# Patient Record
Sex: Female | Born: 1945 | Race: White | Hispanic: No | Marital: Married | Smoking: Former smoker
Health system: Southern US, Community
[De-identification: ages and names within clinical notes are randomized; demographics above are authoritative.]

## PROBLEM LIST (undated history)

## (undated) DIAGNOSIS — C44722 Squamous cell carcinoma of skin of right lower limb, including hip: Secondary | ICD-10-CM

## (undated) DIAGNOSIS — G4733 Obstructive sleep apnea (adult) (pediatric): Secondary | ICD-10-CM

## (undated) DIAGNOSIS — Z9989 Dependence on other enabling machines and devices: Secondary | ICD-10-CM

## (undated) DIAGNOSIS — C20 Malignant neoplasm of rectum: Secondary | ICD-10-CM

## (undated) DIAGNOSIS — I10 Essential (primary) hypertension: Secondary | ICD-10-CM

## (undated) DIAGNOSIS — I2510 Atherosclerotic heart disease of native coronary artery without angina pectoris: Secondary | ICD-10-CM

## (undated) DIAGNOSIS — Z933 Colostomy status: Secondary | ICD-10-CM

## (undated) DIAGNOSIS — E782 Mixed hyperlipidemia: Secondary | ICD-10-CM

## (undated) DIAGNOSIS — K219 Gastro-esophageal reflux disease without esophagitis: Secondary | ICD-10-CM

## (undated) DIAGNOSIS — C439 Malignant melanoma of skin, unspecified: Secondary | ICD-10-CM

## (undated) DIAGNOSIS — I209 Angina pectoris, unspecified: Secondary | ICD-10-CM

## (undated) DIAGNOSIS — E039 Hypothyroidism, unspecified: Secondary | ICD-10-CM

## (undated) DIAGNOSIS — M1990 Unspecified osteoarthritis, unspecified site: Secondary | ICD-10-CM

## (undated) DIAGNOSIS — E119 Type 2 diabetes mellitus without complications: Secondary | ICD-10-CM

## (undated) HISTORY — PX: BREAST EXCISIONAL BIOPSY: SUR124

## (undated) HISTORY — PX: MELANOMA EXCISION: SHX5266

## (undated) HISTORY — PX: CORONARY ANGIOPLASTY WITH STENT PLACEMENT: SHX49

## (undated) HISTORY — PX: BREAST CYST ASPIRATION: SHX578

## (undated) HISTORY — PX: TONSILLECTOMY: SUR1361

## (undated) HISTORY — PX: SQUAMOUS CELL CARCINOMA EXCISION: SHX2433

## (undated) HISTORY — DX: Atherosclerotic heart disease of native coronary artery without angina pectoris: I25.10

## (undated) HISTORY — PX: APPENDECTOMY: SHX54

## (undated) HISTORY — DX: Gastro-esophageal reflux disease without esophagitis: K21.9

## (undated) HISTORY — DX: Mixed hyperlipidemia: E78.2

## (undated) HISTORY — PX: LAPAROSCOPIC CHOLECYSTECTOMY: SUR755

## (undated) HISTORY — PX: RIGHT OOPHORECTOMY: SHX2359

---

## 1998-08-06 ENCOUNTER — Other Ambulatory Visit: Admission: RE | Admit: 1998-08-06 | Discharge: 1998-08-06 | Payer: Self-pay | Admitting: *Deleted

## 2000-07-05 ENCOUNTER — Ambulatory Visit (HOSPITAL_COMMUNITY): Admission: RE | Admit: 2000-07-05 | Discharge: 2000-07-05 | Payer: Self-pay | Admitting: Family Medicine

## 2000-07-05 ENCOUNTER — Encounter: Payer: Self-pay | Admitting: Family Medicine

## 2000-10-30 ENCOUNTER — Other Ambulatory Visit: Admission: RE | Admit: 2000-10-30 | Discharge: 2000-10-30 | Payer: Self-pay | Admitting: *Deleted

## 2000-10-31 ENCOUNTER — Encounter: Admission: RE | Admit: 2000-10-31 | Discharge: 2000-10-31 | Payer: Self-pay | Admitting: *Deleted

## 2000-10-31 ENCOUNTER — Encounter: Payer: Self-pay | Admitting: *Deleted

## 2001-12-24 ENCOUNTER — Ambulatory Visit (HOSPITAL_COMMUNITY): Admission: RE | Admit: 2001-12-24 | Discharge: 2001-12-24 | Payer: Self-pay | Admitting: Gastroenterology

## 2002-01-07 ENCOUNTER — Encounter: Payer: Self-pay | Admitting: *Deleted

## 2002-01-07 ENCOUNTER — Ambulatory Visit (HOSPITAL_COMMUNITY): Admission: RE | Admit: 2002-01-07 | Discharge: 2002-01-07 | Payer: Self-pay | Admitting: *Deleted

## 2002-01-29 ENCOUNTER — Encounter: Payer: Self-pay | Admitting: Cardiology

## 2002-01-29 ENCOUNTER — Ambulatory Visit: Admission: RE | Admit: 2002-01-29 | Discharge: 2002-01-29 | Payer: Self-pay | Admitting: *Deleted

## 2002-02-07 ENCOUNTER — Ambulatory Visit: Admission: RE | Admit: 2002-02-07 | Discharge: 2002-02-07 | Payer: Self-pay | Admitting: *Deleted

## 2002-03-10 ENCOUNTER — Ambulatory Visit (HOSPITAL_COMMUNITY): Admission: RE | Admit: 2002-03-10 | Discharge: 2002-03-11 | Payer: Self-pay | Admitting: Cardiology

## 2002-09-22 ENCOUNTER — Ambulatory Visit (HOSPITAL_COMMUNITY): Admission: RE | Admit: 2002-09-22 | Discharge: 2002-09-22 | Payer: Self-pay | Admitting: Gastroenterology

## 2002-09-22 ENCOUNTER — Encounter (INDEPENDENT_AMBULATORY_CARE_PROVIDER_SITE_OTHER): Payer: Self-pay

## 2002-09-30 ENCOUNTER — Encounter: Payer: Self-pay | Admitting: Surgery

## 2002-09-30 ENCOUNTER — Encounter: Admission: RE | Admit: 2002-09-30 | Discharge: 2002-09-30 | Payer: Self-pay | Admitting: Surgery

## 2002-10-10 ENCOUNTER — Observation Stay (HOSPITAL_COMMUNITY): Admission: RE | Admit: 2002-10-10 | Discharge: 2002-10-11 | Payer: Self-pay | Admitting: Surgery

## 2002-10-10 ENCOUNTER — Encounter (INDEPENDENT_AMBULATORY_CARE_PROVIDER_SITE_OTHER): Payer: Self-pay | Admitting: Specialist

## 2002-10-17 ENCOUNTER — Ambulatory Visit: Admission: RE | Admit: 2002-10-17 | Discharge: 2002-10-29 | Payer: Self-pay

## 2002-12-02 ENCOUNTER — Ambulatory Visit: Admission: RE | Admit: 2002-12-02 | Discharge: 2003-02-01 | Payer: Self-pay | Admitting: Radiation Oncology

## 2002-12-16 ENCOUNTER — Encounter: Payer: Self-pay | Admitting: Surgery

## 2002-12-16 ENCOUNTER — Ambulatory Visit (HOSPITAL_COMMUNITY): Admission: RE | Admit: 2002-12-16 | Discharge: 2002-12-16 | Payer: Self-pay | Admitting: Oncology

## 2002-12-16 ENCOUNTER — Ambulatory Visit (HOSPITAL_BASED_OUTPATIENT_CLINIC_OR_DEPARTMENT_OTHER): Admission: RE | Admit: 2002-12-16 | Discharge: 2002-12-16 | Payer: Self-pay | Admitting: Surgery

## 2002-12-16 ENCOUNTER — Encounter: Payer: Self-pay | Admitting: Oncology

## 2002-12-17 ENCOUNTER — Encounter: Payer: Self-pay | Admitting: Surgery

## 2002-12-17 ENCOUNTER — Ambulatory Visit (HOSPITAL_COMMUNITY): Admission: RE | Admit: 2002-12-17 | Discharge: 2002-12-17 | Payer: Self-pay | Admitting: Surgery

## 2002-12-18 ENCOUNTER — Encounter: Payer: Self-pay | Admitting: Oncology

## 2002-12-18 ENCOUNTER — Ambulatory Visit (HOSPITAL_COMMUNITY): Admission: RE | Admit: 2002-12-18 | Discharge: 2002-12-18 | Payer: Self-pay | Admitting: Oncology

## 2003-03-03 ENCOUNTER — Ambulatory Visit: Admission: RE | Admit: 2003-03-03 | Discharge: 2003-03-03 | Payer: Self-pay | Admitting: Radiation Oncology

## 2003-03-18 ENCOUNTER — Ambulatory Visit (HOSPITAL_COMMUNITY): Admission: RE | Admit: 2003-03-18 | Discharge: 2003-03-18 | Payer: Self-pay | Admitting: Hematology & Oncology

## 2003-03-18 IMAGING — CT CT PELVIS W/ CM
1 of 6 series · 12 of 32 positions shown, 18 images · non-contrast
Comparison: none

CLINICAL DATA: Rectal carcinoma.

[Series 5: — · axial · 0.74mm/px · z∈[+814,+1254]mm · 12 of 104 slices shown, 18 images]
[im 8/104  soft-tissue]
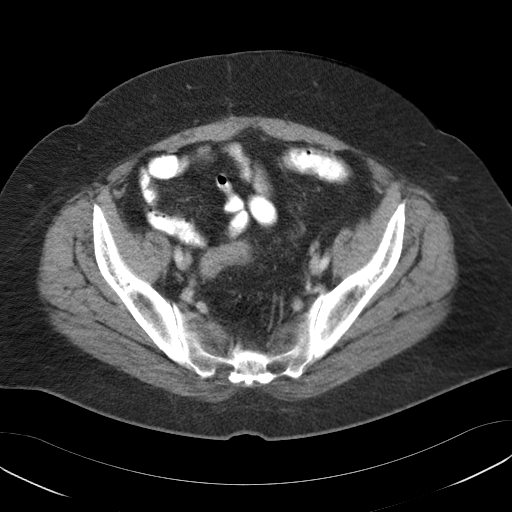
[im 8/104  bone]
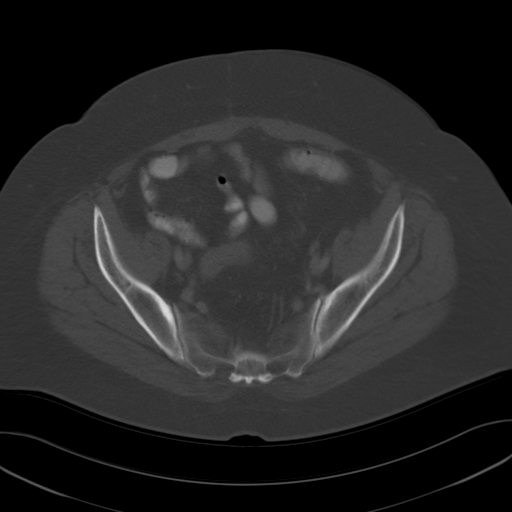
[im 16/104  soft-tissue]
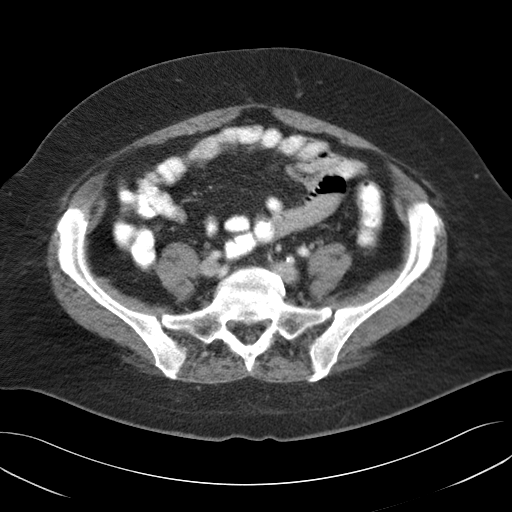
[im 24/104  soft-tissue]
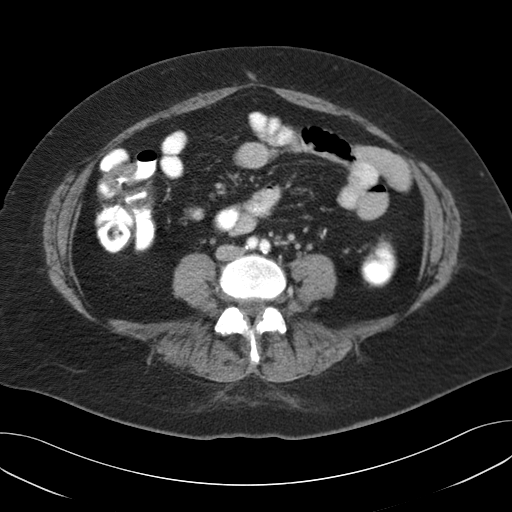
[im 32/104  soft-tissue]
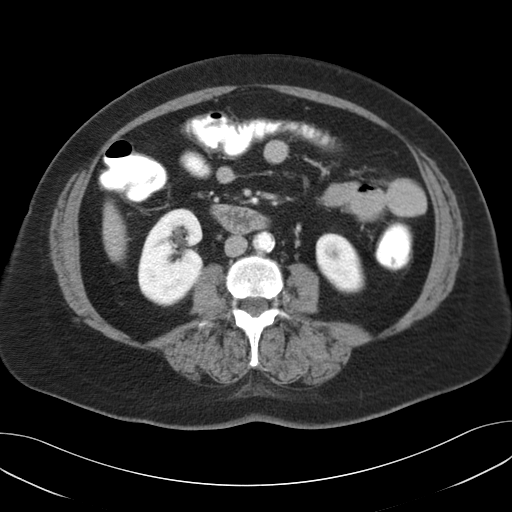
[im 40/104  soft-tissue]
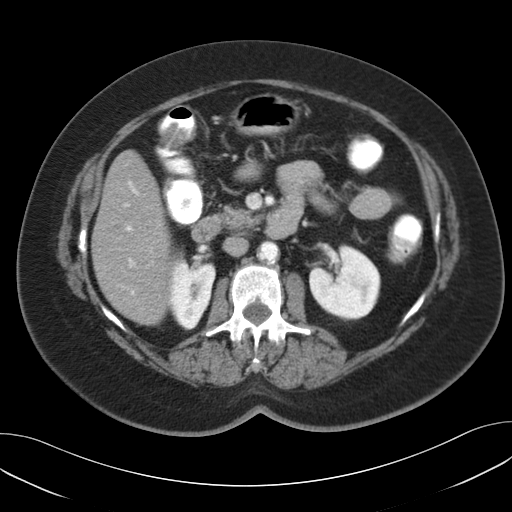
[im 48/104  soft-tissue]
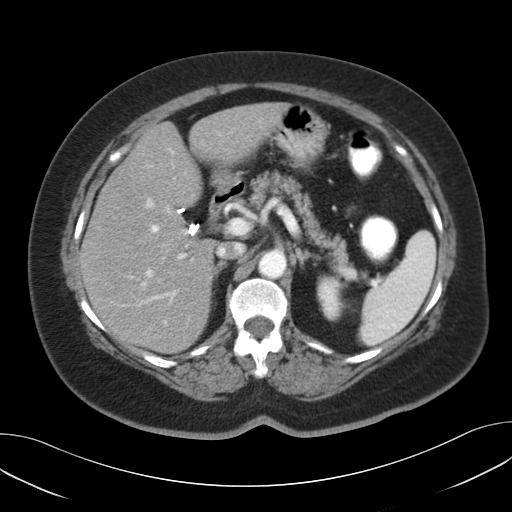
[im 56/104  soft-tissue]
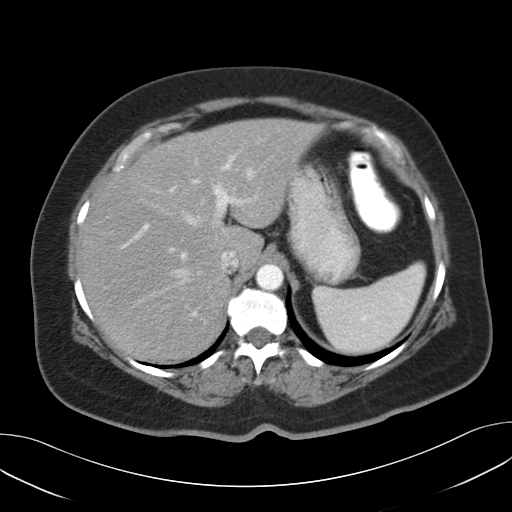
[im 64/104  soft-tissue]
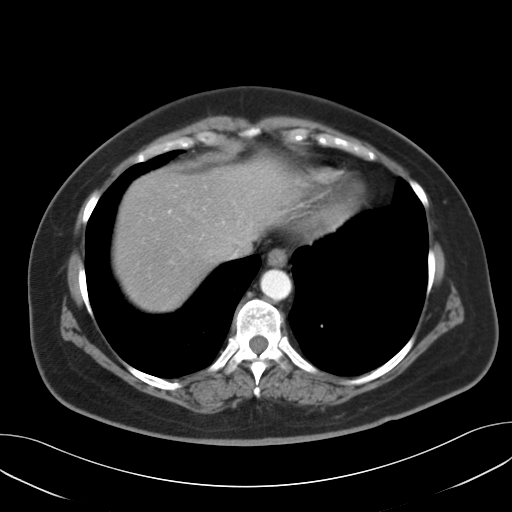
[im 72/104  soft-tissue]
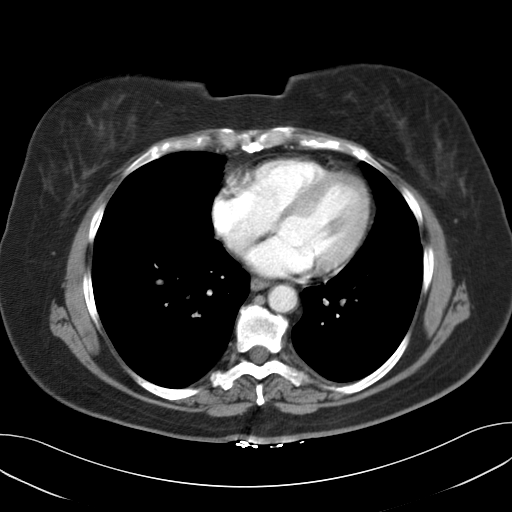
[im 72/104  lung]
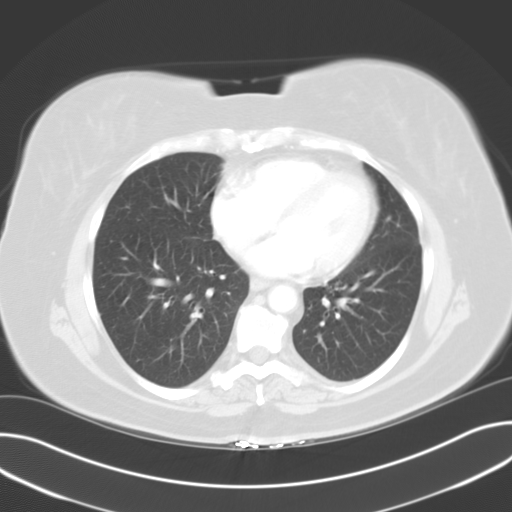
[im 72/104  bone]
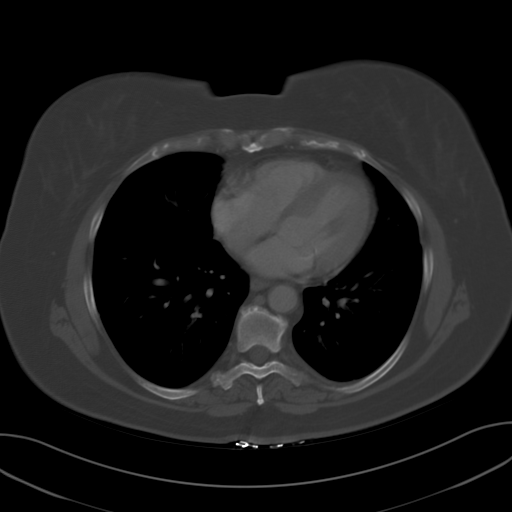
[im 80/104  soft-tissue]
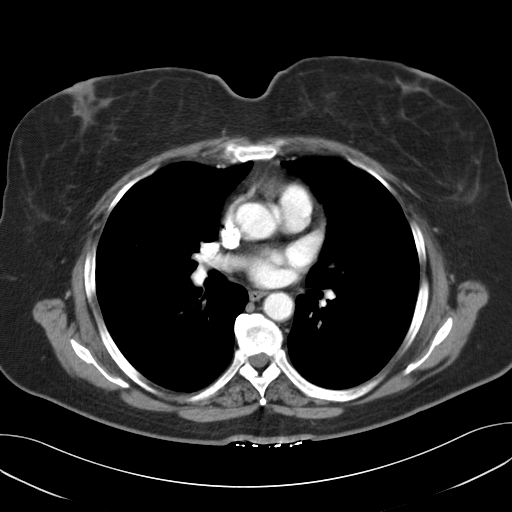
[im 80/104  lung]
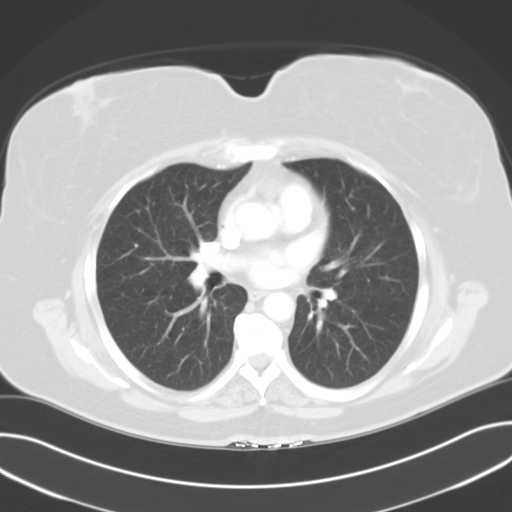
[im 88/104  soft-tissue]
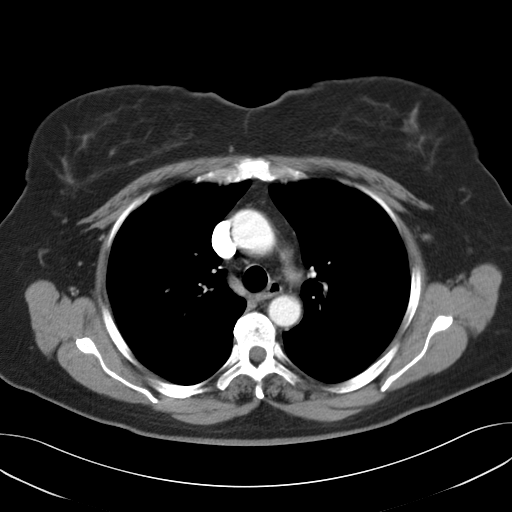
[im 88/104  lung]
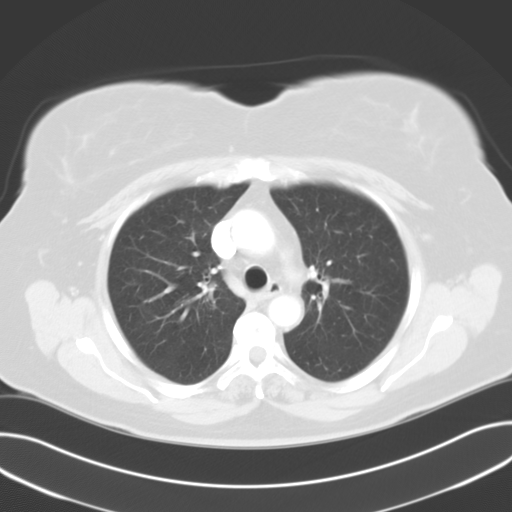
[im 96/104  soft-tissue]
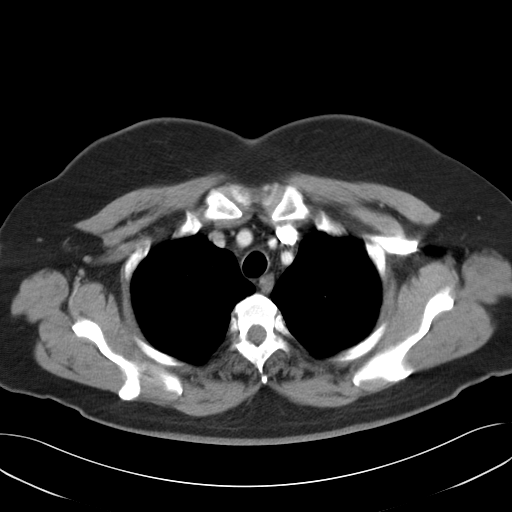
[im 96/104  lung]
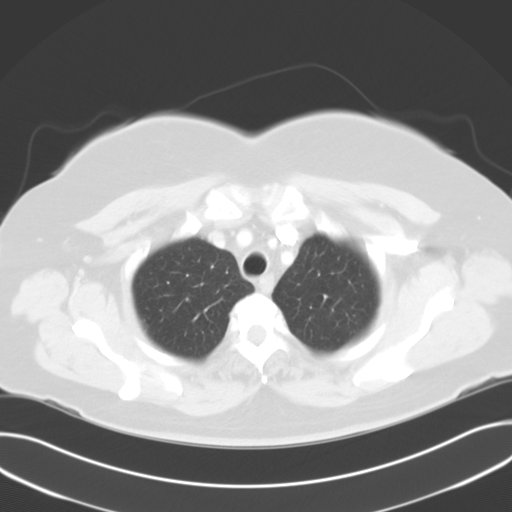

[12 of 32 positions shown; findings below may reference images not displayed]

CT SCAN OF THE CHEST WITH CONTRAST
 Multiple spiral images were made through the chest after the intravenous injection of 150 cc of Omnipaque 300.  There is no hilar or mediastinal adenopathy.  The lung fields are clear.  No pleural effusion is present.  The lung fields are clear. 

 IMPRESSION
 Negative CT scan of the chest with contrast with no evidence for metastatic disease.

 CT SCAN OF THE ABDOMEN WITH CONTRAST
 Additional spiral images through the abdomen after oral and intravenous contrast demonstrate a normal appearing liver.  There is no mass, adenopathy, or free fluid.  The pancreas, kidneys, and retroperitoneal structures are normal. 

 IMPRESSION
 Negative CT scan of the abdomen with contrast with no evidence for metastatic disease.

 CT SCAN OF THE PELVIS WITH CONTRAST
 Additional images through the pelvis after oral and intravenous contrast reveal patient has had a previous appendectomy and right oophorectomy.  There is no mass, adenopathy, or free fluid.  Diverticular changes of the sigmoid colon are noted with no diverticulitis.  There is no bone abnormality of the pelvis.

 IMPRESSION
 Negative CT scan of the pelvis with contrast with no evidence for recurrent or metastatic disease. 

 [REDACTED]

## 2003-04-14 ENCOUNTER — Ambulatory Visit (HOSPITAL_BASED_OUTPATIENT_CLINIC_OR_DEPARTMENT_OTHER): Admission: RE | Admit: 2003-04-14 | Discharge: 2003-04-14 | Payer: Self-pay | Admitting: Surgery

## 2003-05-09 DIAGNOSIS — C20 Malignant neoplasm of rectum: Secondary | ICD-10-CM

## 2003-05-09 HISTORY — PX: COLECTOMY: SHX59

## 2003-05-09 HISTORY — DX: Malignant neoplasm of rectum: C20

## 2003-06-15 ENCOUNTER — Ambulatory Visit (HOSPITAL_COMMUNITY): Admission: RE | Admit: 2003-06-15 | Discharge: 2003-06-15 | Payer: Self-pay | Admitting: Hematology & Oncology

## 2003-06-15 IMAGING — CT CT CHEST W/ CM
1 of 6 series · 12 of 30 positions shown, 15 images · IV contrast (omnipaque)
Comparison: none

CLINICAL DATA: 57 year old with rectal cancer.
 CT CHEST, ABDOMEN, AND PELVIS WITH CONTRAST
 Helical CT scanning of the chest, abdomen, and pelvis was performed after bolus infusion of a total of 150 cc of Omnipaque 300 and the use of dilute oral contrast.  This study is compared with the prior CT examination from [DATE].
 CT CHEST:
 Examination of the chest wall, soft tissues, and bony structures demonstrates no significant abnormality.  The patient?s port-a-cath has been removed.  There is some scarring change where it was in place in the left upper chest.  No supraclavicular or axillary adenopathy.
 No breast masses are seen.
 Examination of the mediastinum demonstrates no mediastinal or hilar adenopathy.  The heart size is normal.  Stable coronary artery calcifications.  The esophagus appears normal.
 Examination of the lung parenchyma demonstrates no metastatic pulmonary nodules.  No acute pulmonary findings.  There are degenerative changes in the thoracic spine but no destructive bony lesions.  
 IMPRESSION
 1.  Stable CT appearance of the chest with no evidence for metastatic disease.  The left-sided port-a-cath has been removed.
 CT ABDOMEN:
 There is diffuse fatty infiltration of the liver.  No focal hepatic lesions or intrahepatic duct dilatation.  The patient has had a cholecystectomy.  The spleen is normal in size.  The pancreas, adrenal glands, and kidneys demonstrate no significant abnormalities.  The aorta is normal in caliber.  Distally there is a focal dissection which I think was present on the prior study and really hasn?t changed.  The appear to be two renal arteries on the right and on the left.  No mesenteric or retroperitoneal masses or adenopathy.  The stomach is not well distended with contrast but no gross abnormalities are seen.  The duodenum, small bowel, and colon demonstrate no significant findings.  No significant bony findings.
 1.  Stable CT appearance of the abdomen.  I don?t see any evidence for abdominal metastatic disease.
 2.  Probable focal distal aortic dissection, stable.
 CT PELVIS:
 There is diverticulosis of the sigmoid colon which is a stable finding.  The rectum appears normal.  The bladder is unremarkable.  The uterus is normal in size.  The left ovary is slightly enlarged but appears stable.  Sonographic follow-up is suggested.  No pelvic adenopathy or free pelvic fluid collection.  
 1.  Stable diverticulosis of the sigmoid colon.
 2.  Slightly enlarged left ovary.  Sonographic follow-up is recommended.  I don?t see the right ovary for certain.  No pelvic adenopathy.

[Series 7: — · axial · 0.67mm/px · z∈[+902,+1332]mm · 12 of 104 slices shown, 15 images]
[im 9/104  mediastinal]
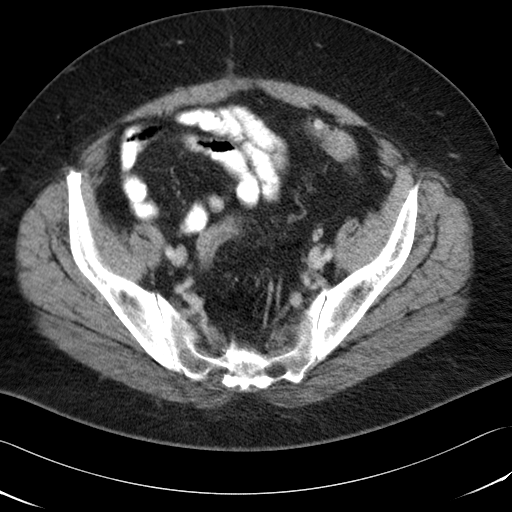
[im 9/104  lung]
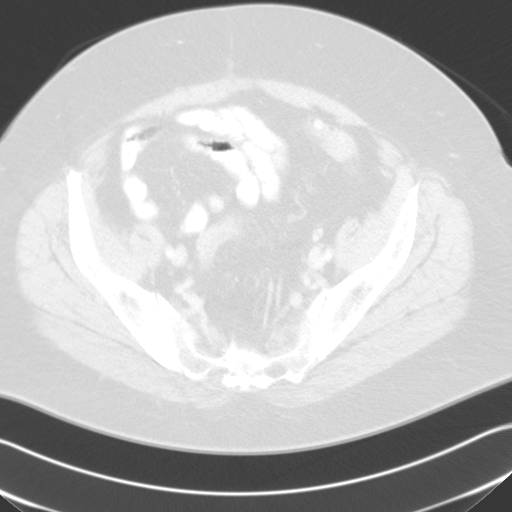
[im 18/104  lung]
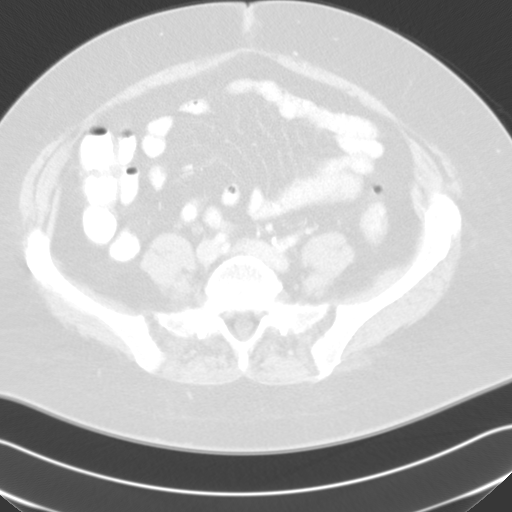
[im 26/104  lung]
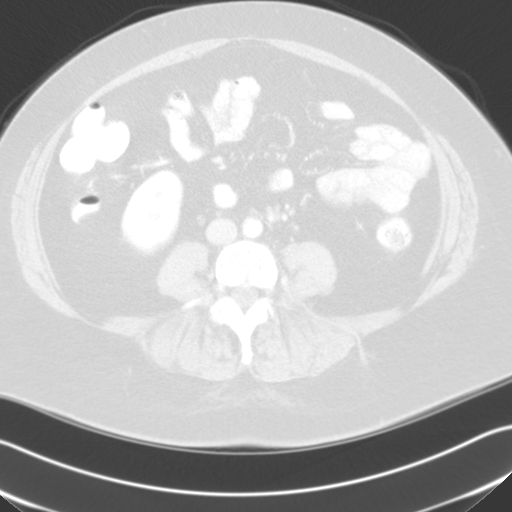
[im 35/104  lung]
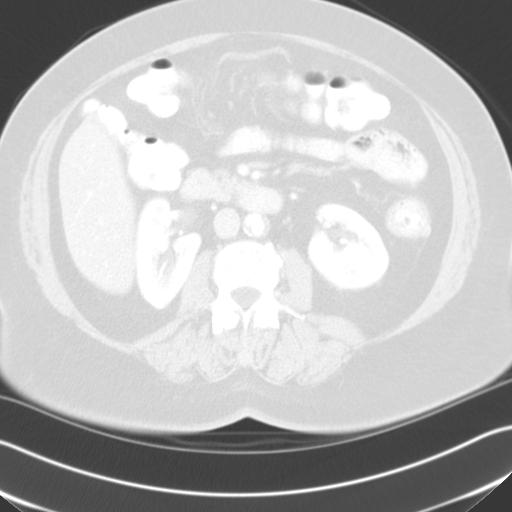
[im 42/104  mediastinal]
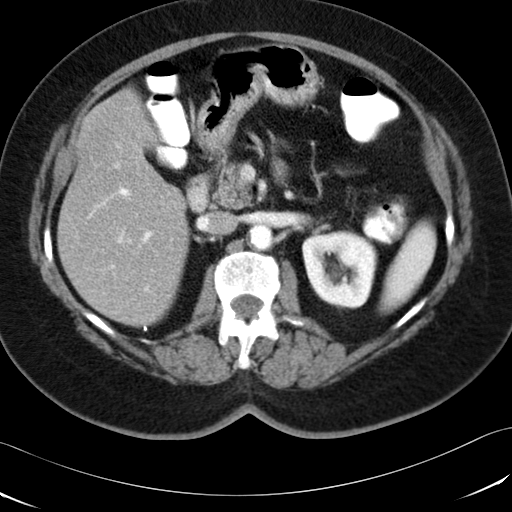
[im 42/104  lung]
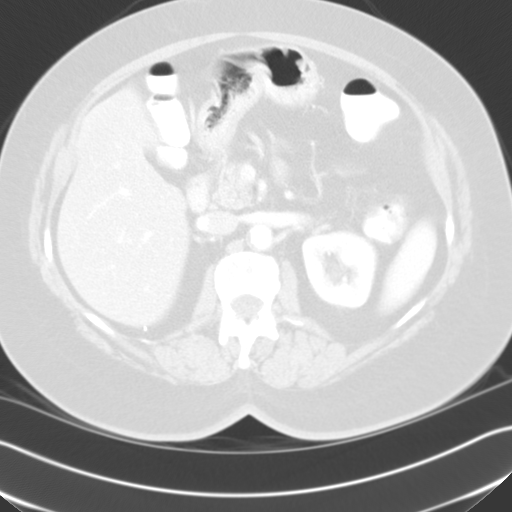
[im 43/104  lung]
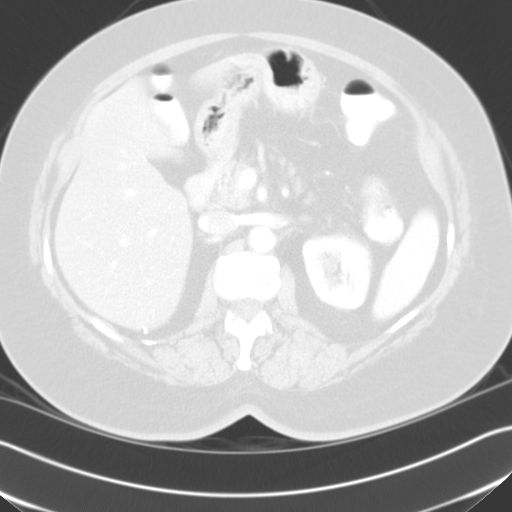
[im 52/104  lung]
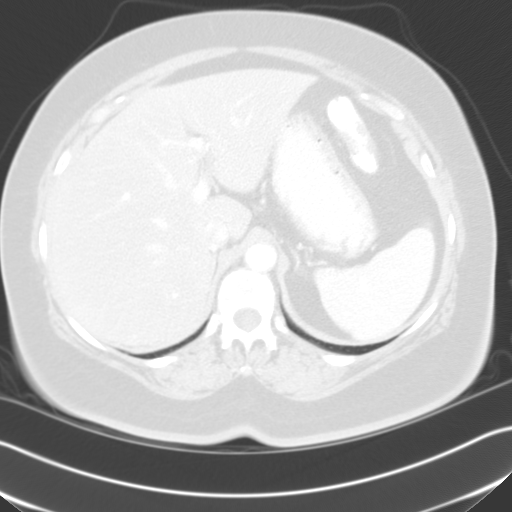
[im 61/104  lung]
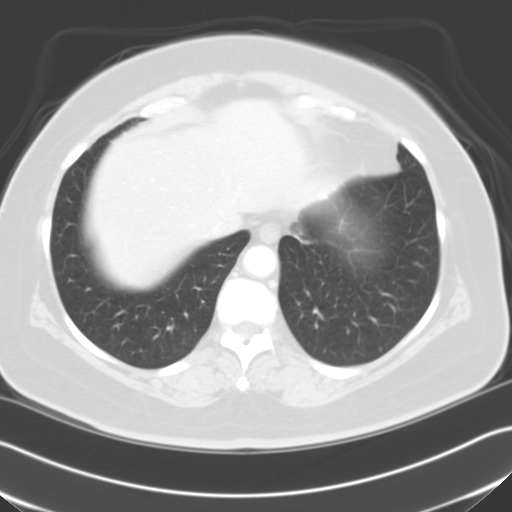
[im 69/104  mediastinal]
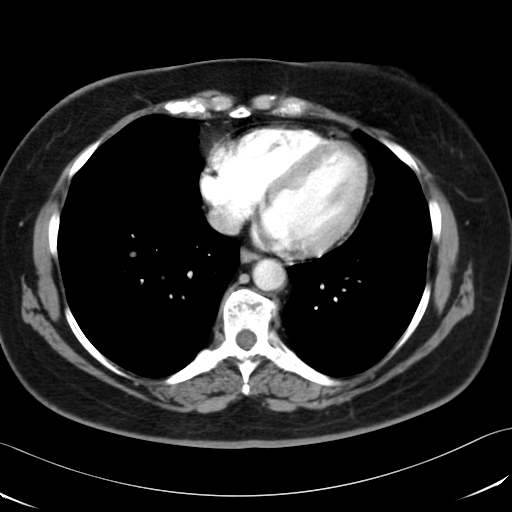
[im 69/104  lung]
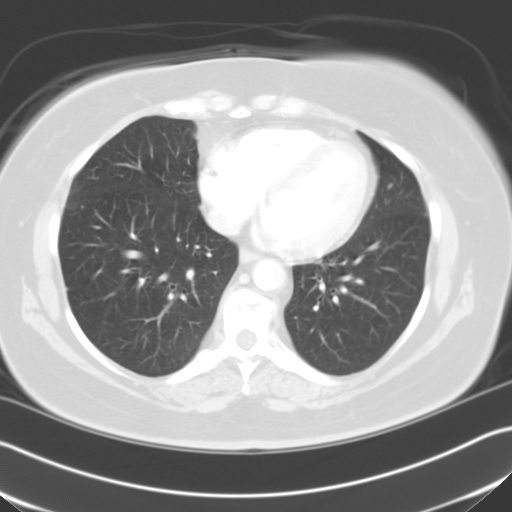
[im 78/104  lung]
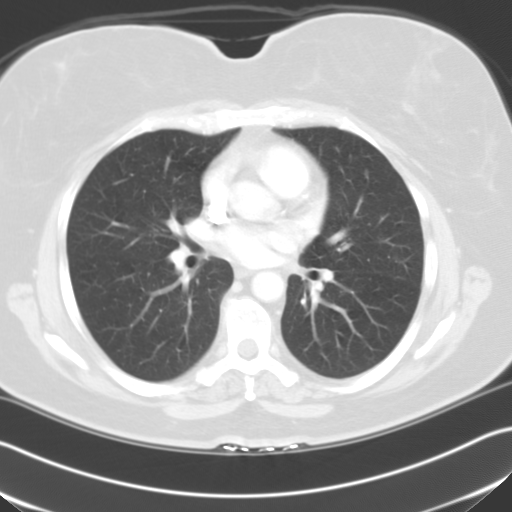
[im 86/104  lung]
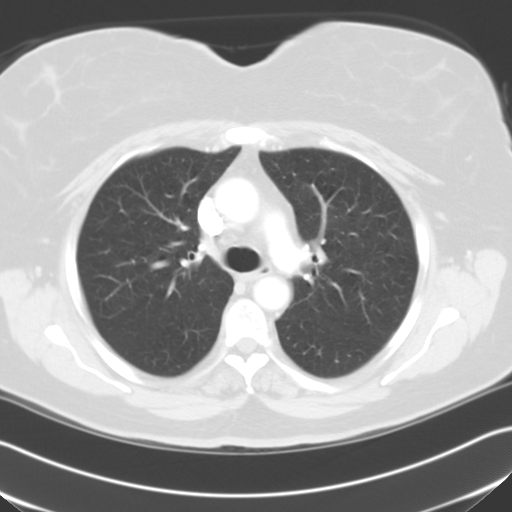
[im 95/104  lung]
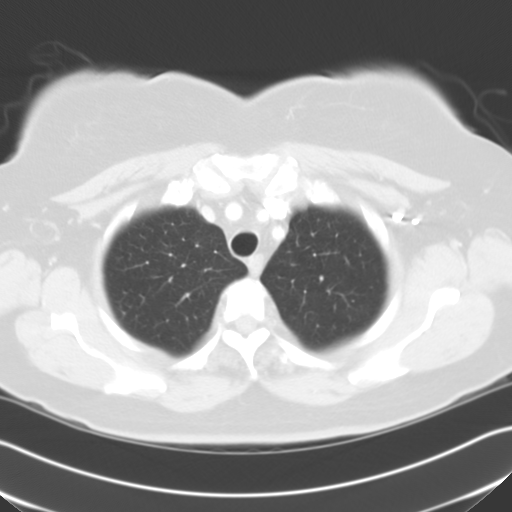

[12 of 30 positions shown; findings below may reference images not displayed]

## 2003-08-04 ENCOUNTER — Ambulatory Visit: Admission: RE | Admit: 2003-08-04 | Discharge: 2003-08-04 | Payer: Self-pay | Admitting: Radiation Oncology

## 2003-09-14 ENCOUNTER — Ambulatory Visit (HOSPITAL_COMMUNITY): Admission: RE | Admit: 2003-09-14 | Discharge: 2003-09-14 | Payer: Self-pay | Admitting: Hematology & Oncology

## 2003-09-14 IMAGING — CT CT ABDOMEN W/ CM
1 of 6 series · 12 of 32 positions shown, 18 images · IV contrast (omnipaque)
Comparison: CT of the chest, abdomen, and pelvis on [DATE].

CLINICAL DATA: 58-year-old with rectal cancer.
TECHNIQUE: Helical images are performed through the chest, abdomen, and pelvis following the administration of oral contrast and during administration of 150 cc of Omnipaque 300.
CT CHEST WITH CONTRAST 
There is no mediastinal, hilar, or axillary adenopathy.  No pulmonary nodules, pleural effusions, or infiltrates are seen.  Degenerative changes are noted in the spine.  
IMPRESSION
No evidence for metastatic disease of the chest.
CT ABDOMEN WITH CONTRAST 
No focal abnormality is seen within the liver, spleen, pancreas, adrenal glands, or kidneys.  The patient has had a cholecystectomy.  Atherosclerotic change is seen in the aorta.  The appearance of the abdominal aorta is stable.  Note is made of multiple colonic diverticula without CT evidence for acute diverticulitis. 
1.  No evidence for metastatic disease of the abdomen.
2.  Status post cholecystectomy. 
CT PELVIS WITH CONTRAST 
There is colonic diverticulosis.  No pelvic adenopathy or free pelvic fluid.  
Diverticulosis without evidence for acute abnormality of the pelvis.  There is no evidence for metastatic disease.

[Series 5: — · axial · 0.73mm/px · z∈[+984,+1424]mm · 12 of 104 slices shown, 18 images]
[im 8/104  soft-tissue]
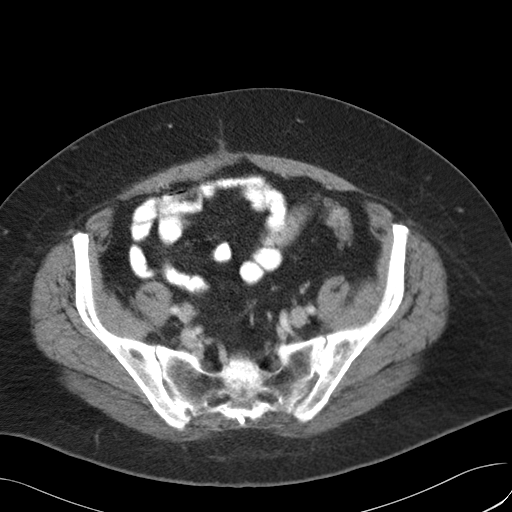
[im 8/104  bone]
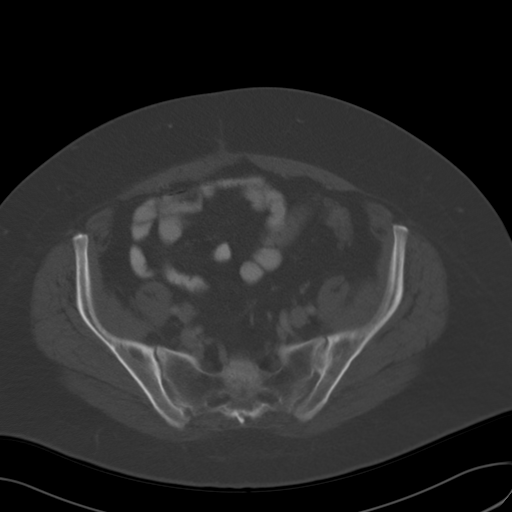
[im 16/104  soft-tissue]
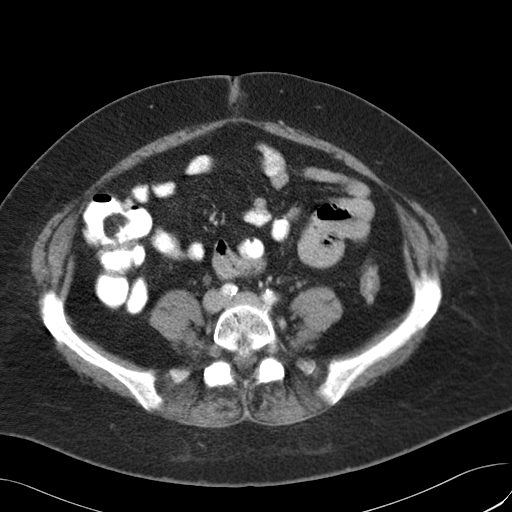
[im 24/104  soft-tissue]
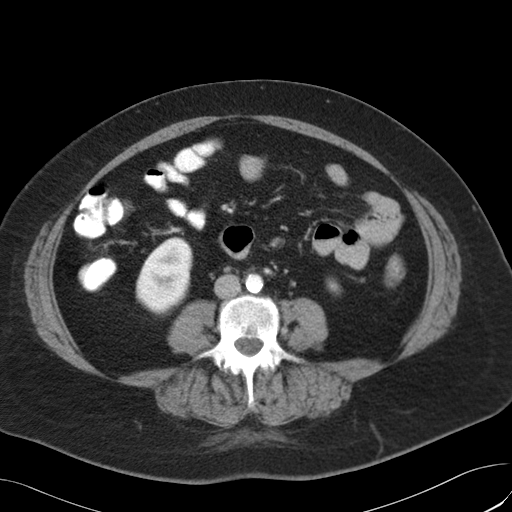
[im 32/104  soft-tissue]
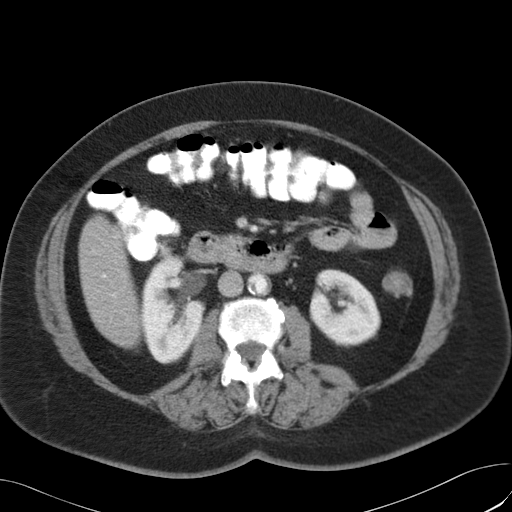
[im 40/104  soft-tissue]
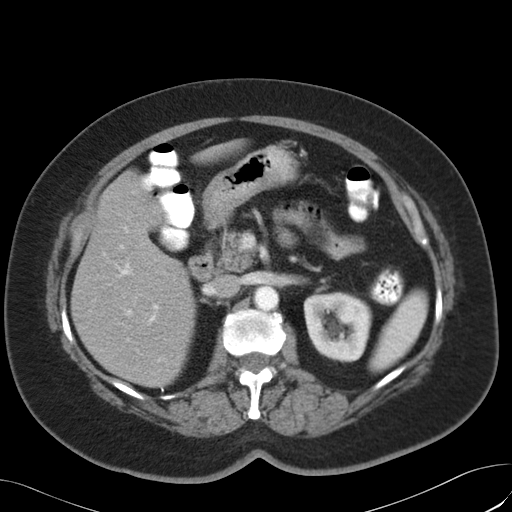
[im 48/104  soft-tissue]
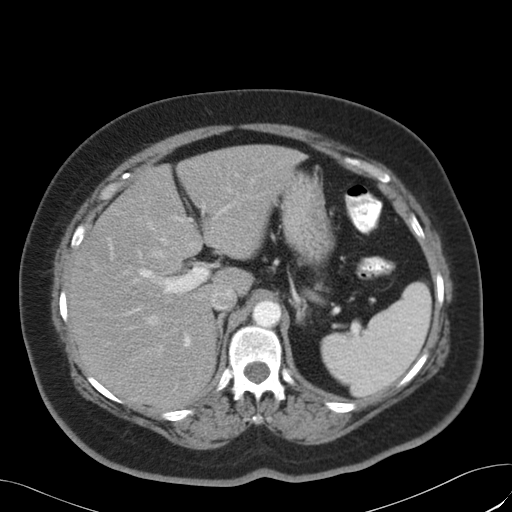
[im 56/104  soft-tissue]
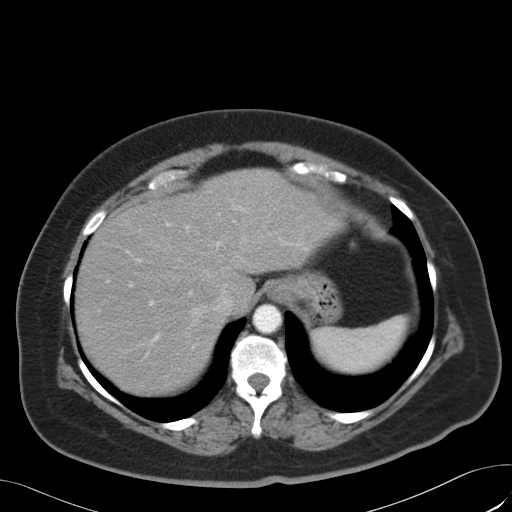
[im 64/104  soft-tissue]
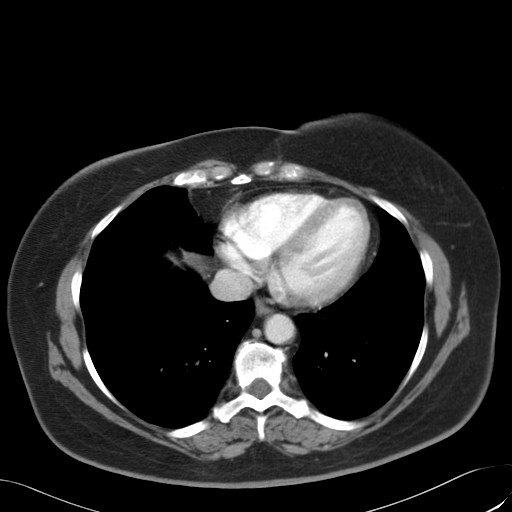
[im 72/104  soft-tissue]
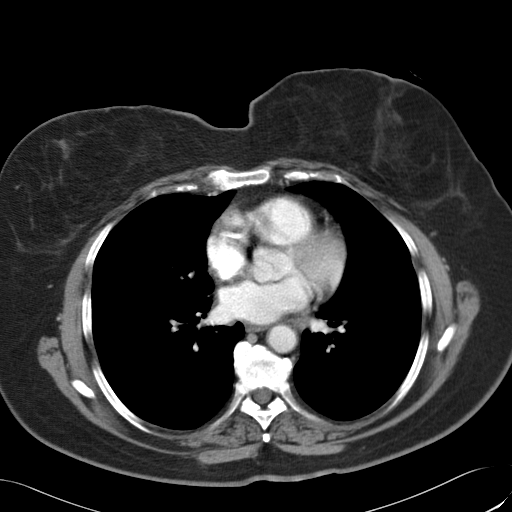
[im 72/104  lung]
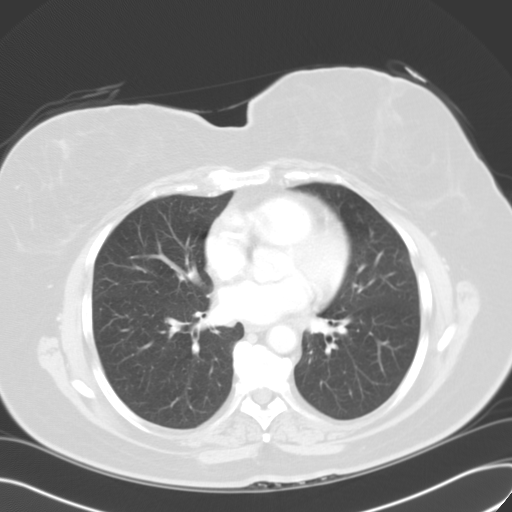
[im 72/104  bone]
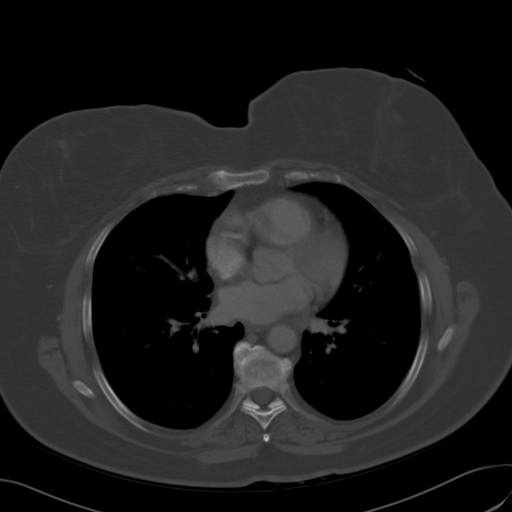
[im 80/104  soft-tissue]
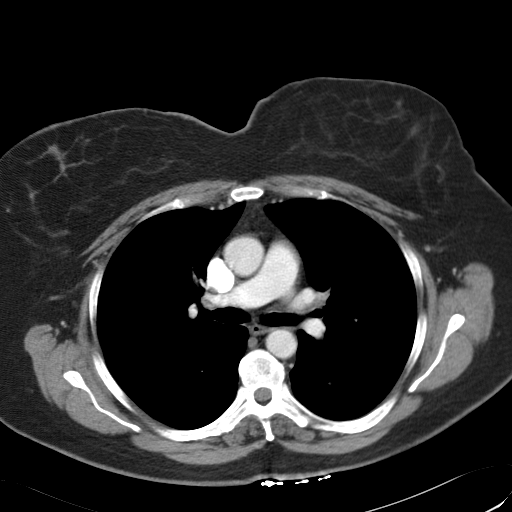
[im 80/104  lung]
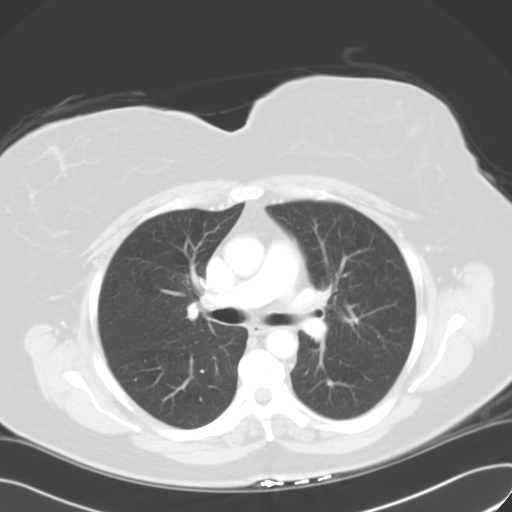
[im 88/104  soft-tissue]
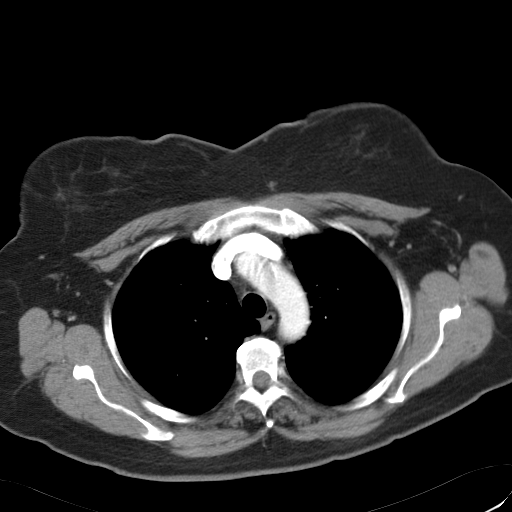
[im 88/104  lung]
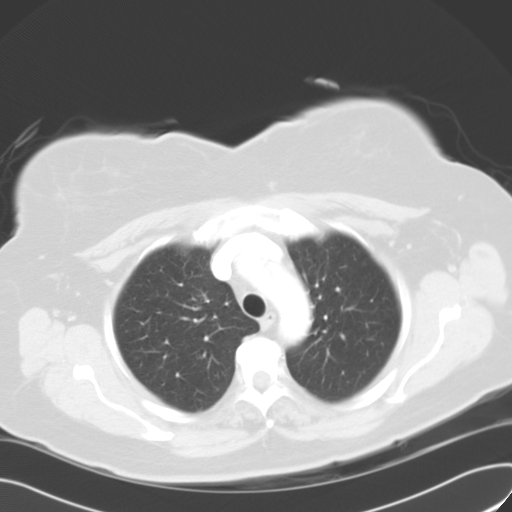
[im 96/104  soft-tissue]
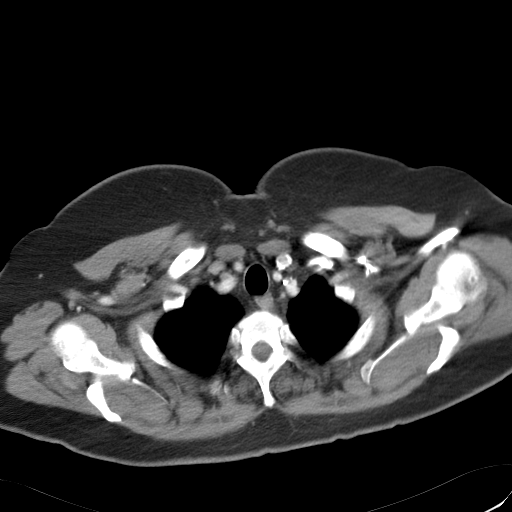
[im 96/104  lung]
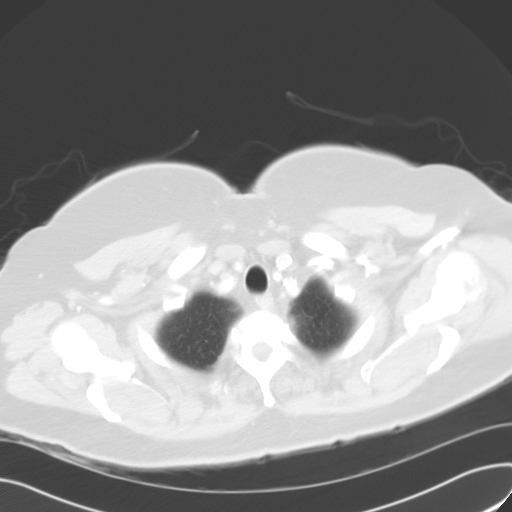

[12 of 32 positions shown; findings below may reference images not displayed]

## 2003-12-21 ENCOUNTER — Ambulatory Visit (HOSPITAL_COMMUNITY): Admission: RE | Admit: 2003-12-21 | Discharge: 2003-12-21 | Payer: Self-pay | Admitting: Gastroenterology

## 2003-12-21 ENCOUNTER — Encounter (INDEPENDENT_AMBULATORY_CARE_PROVIDER_SITE_OTHER): Payer: Self-pay | Admitting: Specialist

## 2004-01-07 IMAGING — CR DG CHEST 2V
2 series · 2 of 2 positions shown · non-contrast
Comparison: none

CLINICAL DATA: Rectal cancer. 
 TWO VIEW CHEST RADIOGRAPH, [DATE]
 Comparing [DATE].

[view not recorded (1 of 2)]
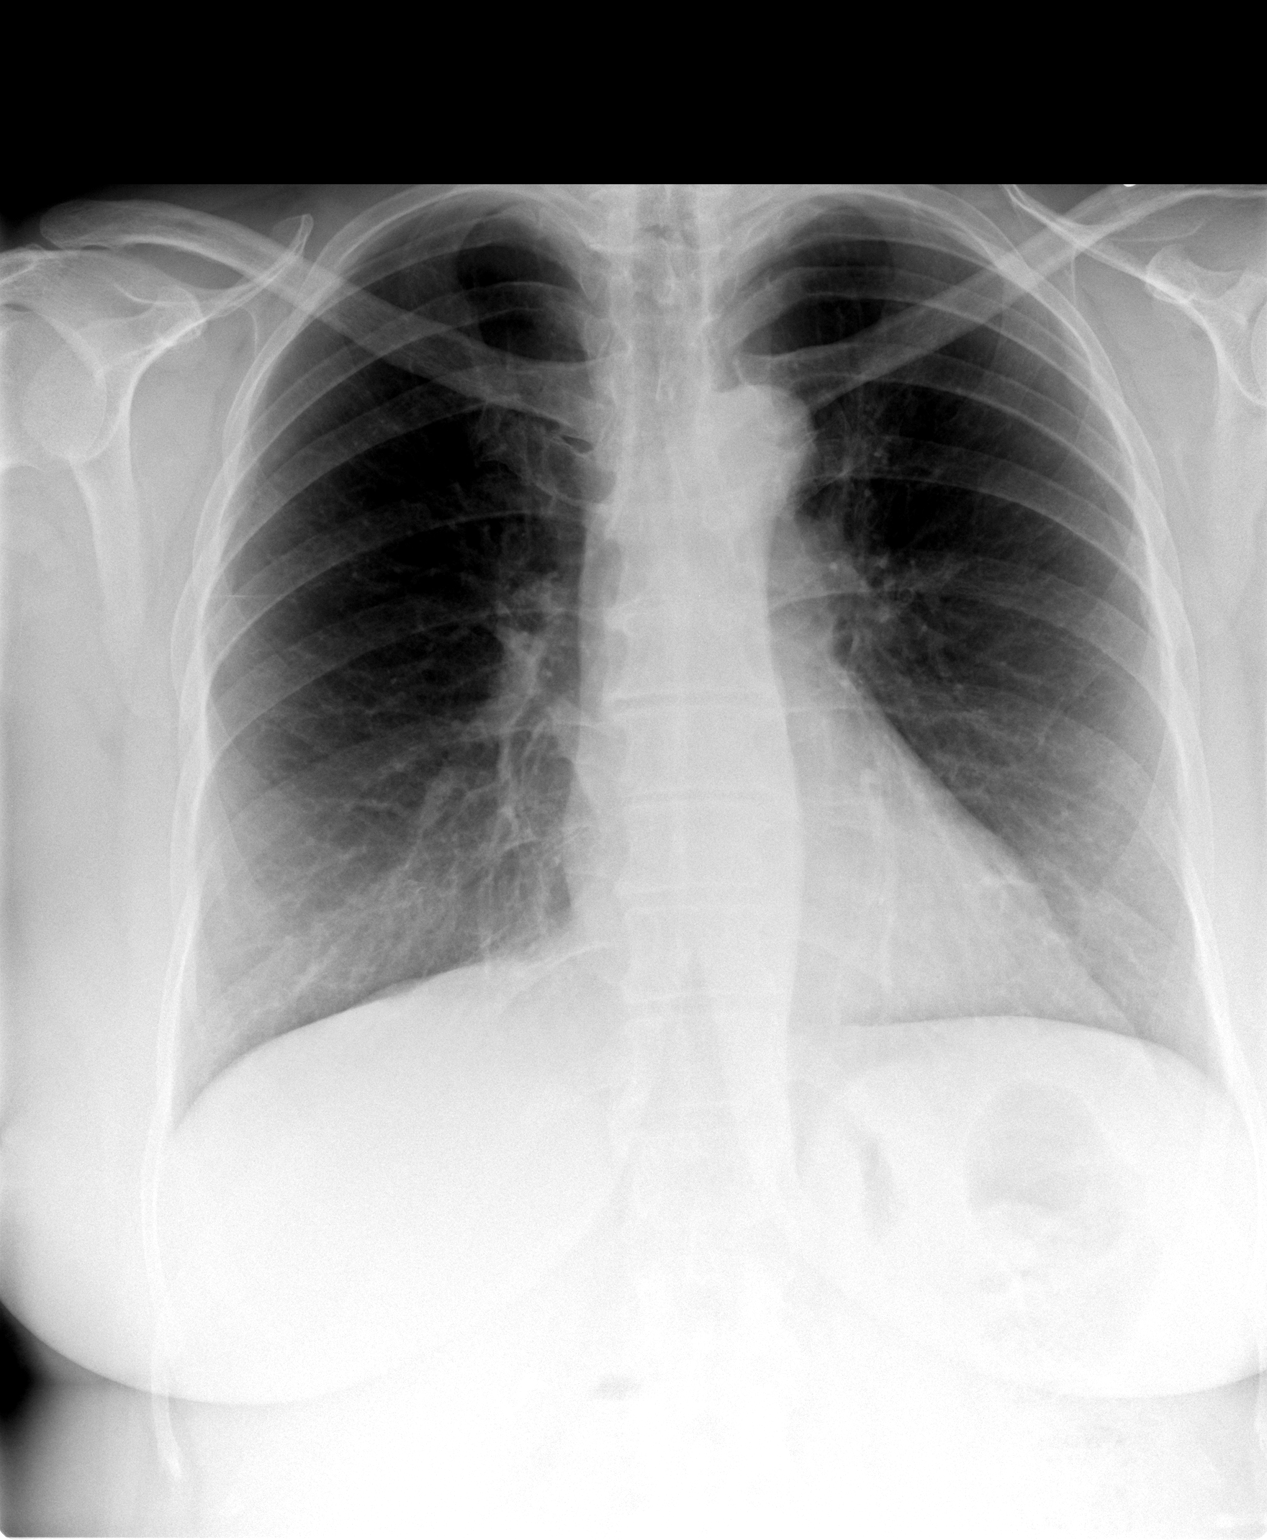

[view not recorded (2 of 2)]
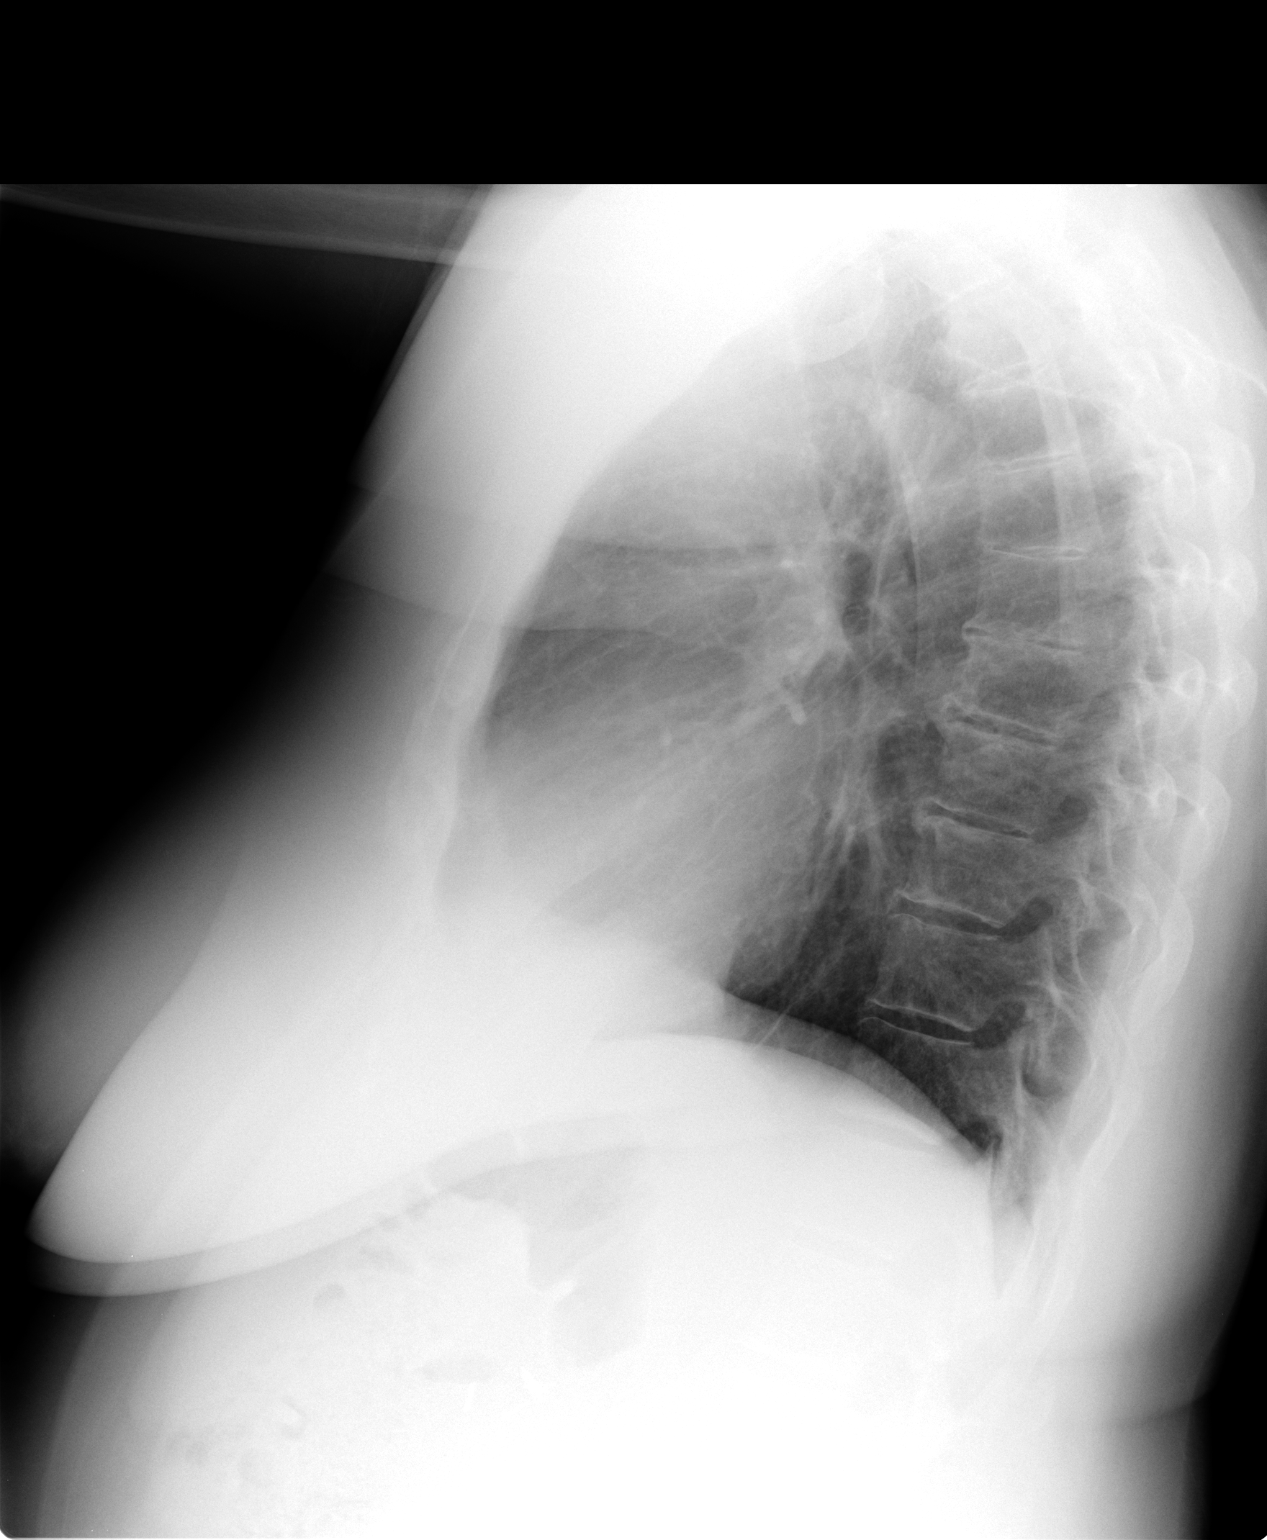

[2 of 2 positions shown; findings below may reference images not displayed]

FINDINGS: No lung mass or nodule is radiographically apparent.  There is some mild thoracic spondylosis.  Heart and mediastinum appear unremarkable.  The lungs appear clear. 
 IMPRESSION
 No active cardiopulmonary disease is radiographically apparent.

## 2004-01-15 ENCOUNTER — Encounter (INDEPENDENT_AMBULATORY_CARE_PROVIDER_SITE_OTHER): Payer: Self-pay | Admitting: Specialist

## 2004-01-15 ENCOUNTER — Inpatient Hospital Stay (HOSPITAL_COMMUNITY): Admission: RE | Admit: 2004-01-15 | Discharge: 2004-01-22 | Payer: Self-pay | Admitting: Surgery

## 2004-05-23 ENCOUNTER — Ambulatory Visit: Payer: Self-pay | Admitting: Hematology & Oncology

## 2004-06-14 ENCOUNTER — Ambulatory Visit (HOSPITAL_COMMUNITY): Admission: RE | Admit: 2004-06-14 | Discharge: 2004-06-14 | Payer: Self-pay | Admitting: Hematology & Oncology

## 2004-06-14 IMAGING — CT CT ABDOMEN W/ CM
1 of 3 series · 14 of 32 positions shown, 19 images · IV contrast (omnipaque)
Comparison: Comparing [DATE].

CLINICAL DATA: Rectal cancer.
 CT OF THE CHEST WITH CONTRAST, [DATE]:
TECHNIQUE: Contiguous axial CT images were obtained from the lung apices through the bases following intravenous administration of 150 cc of Omnipaque 300 IV contrast.
TECHNIQUE: Contiguous axial CT images were obtained from the lung bases through the iliac crests following oral and IV contrast.
TECHNIQUE: Contiguous axial CT images were obtained from the iliac crests to the proximal femurs.

[Series 2: cap w/iv 5.0 b30f · axial · 0.74mm/px · z∈[-433,+102]mm · 14 of 123 slices shown, 19 images]
[im 8/123  soft-tissue]
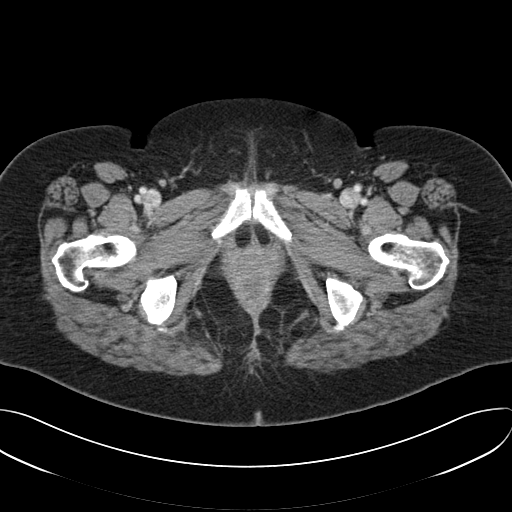
[im 8/123  bone]
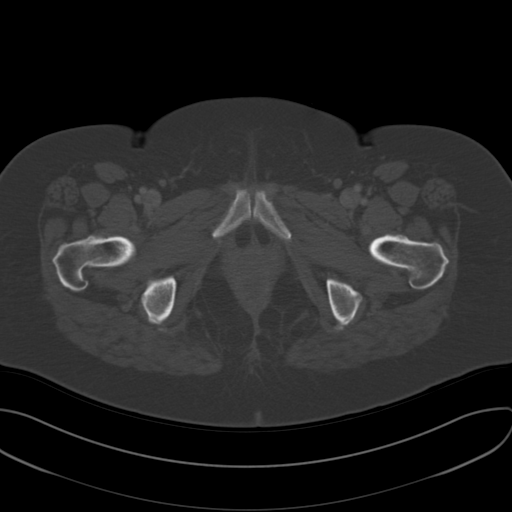
[im 15/123  soft-tissue]
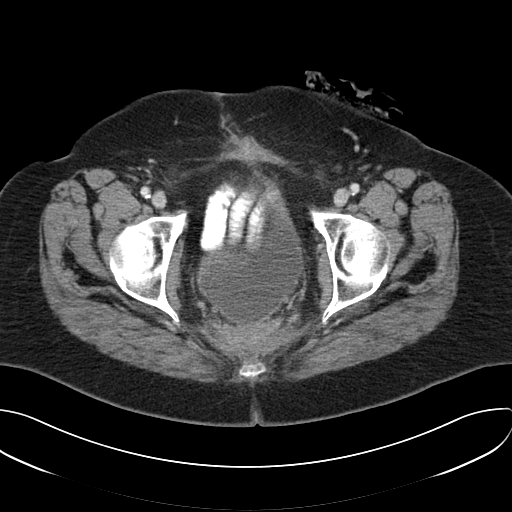
[im 29/123  soft-tissue]
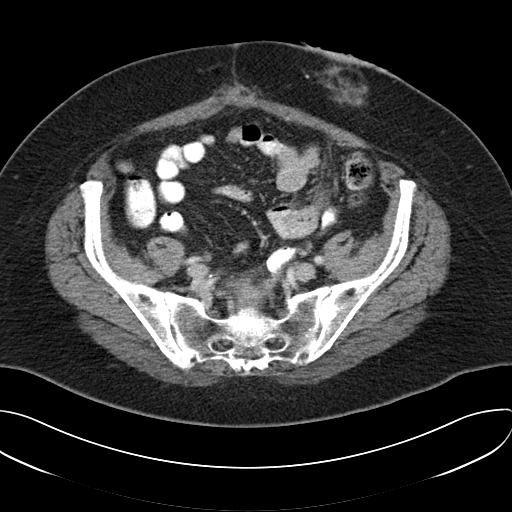
[im 36/123  soft-tissue]
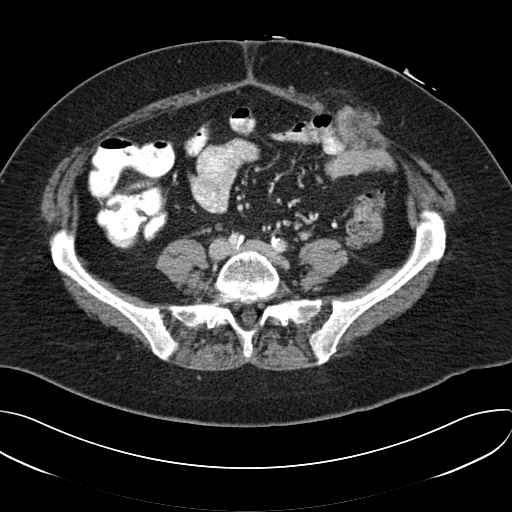
[im 44/123  soft-tissue]
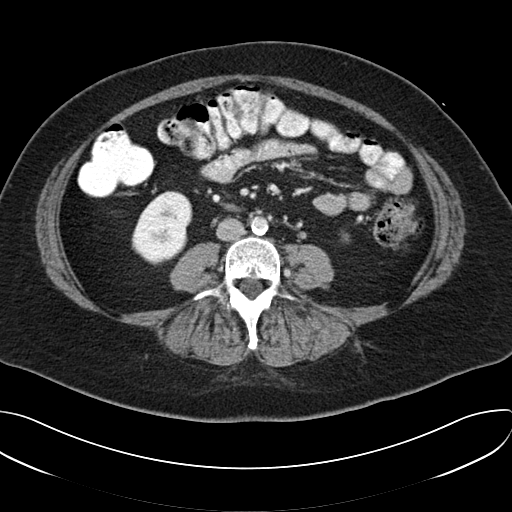
[im 51/123  soft-tissue]
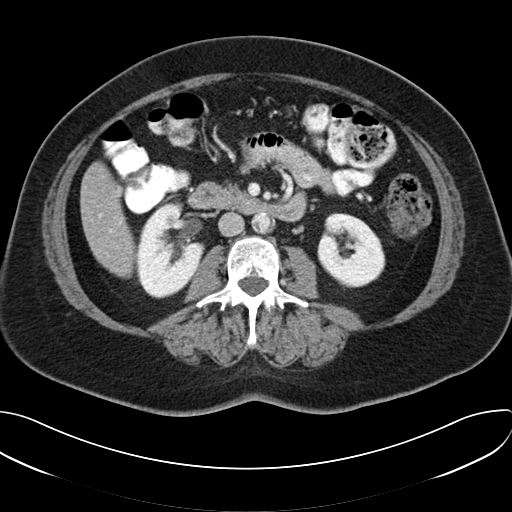
[im 65/123  soft-tissue]
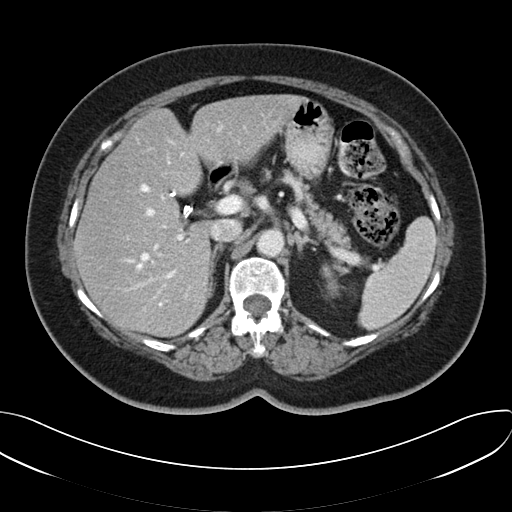
[im 72/123  soft-tissue]
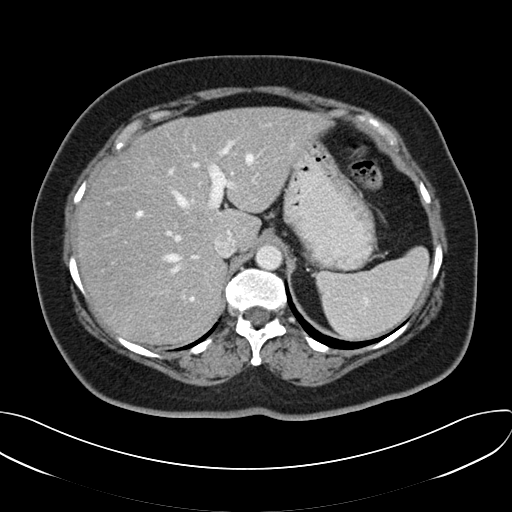
[im 79/123  soft-tissue]
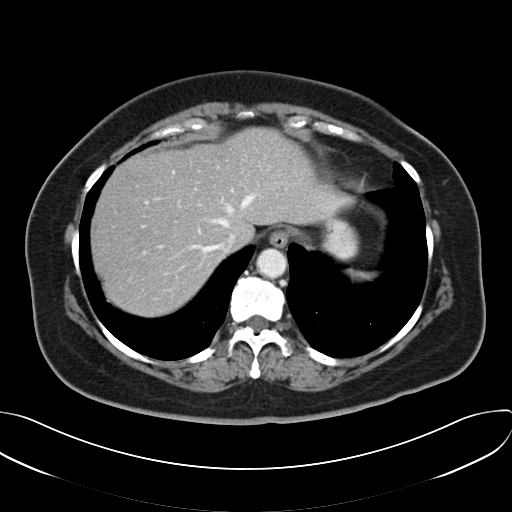
[im 79/123  bone]
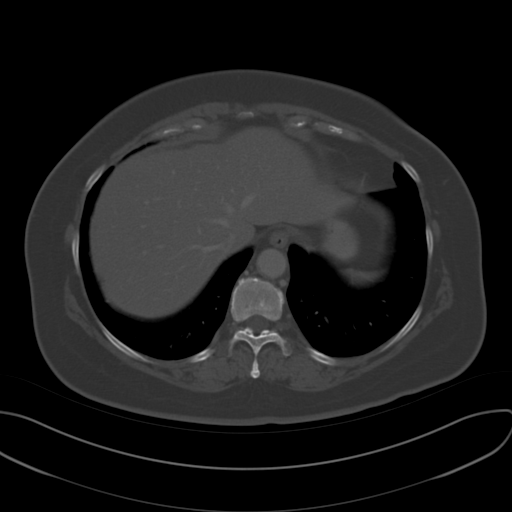
[im 87/123  soft-tissue]
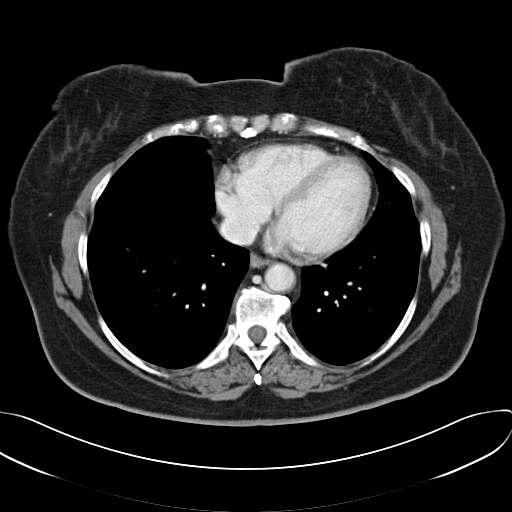
[im 94/123  soft-tissue]
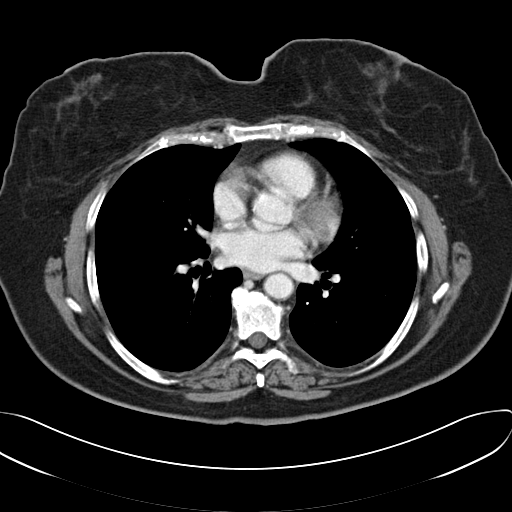
[im 94/123  lung]
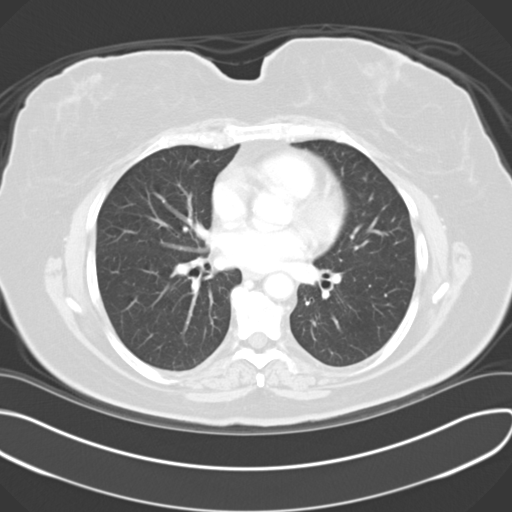
[im 101/123  lung]
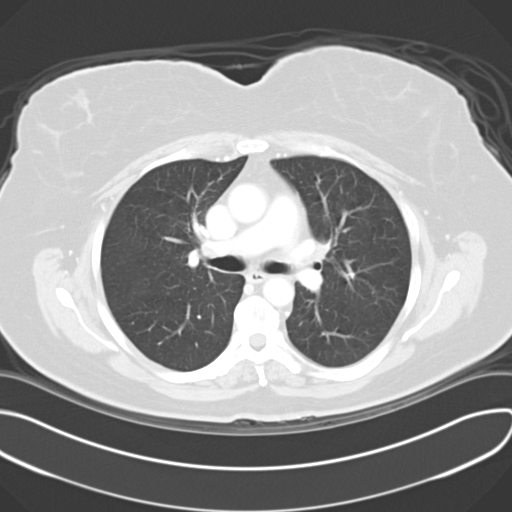
[im 108/123  soft-tissue]
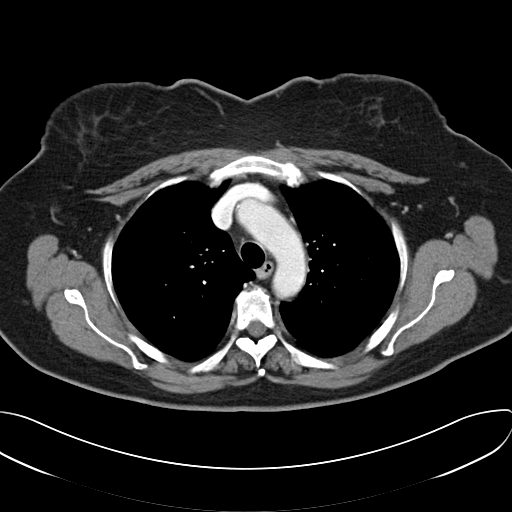
[im 108/123  lung]
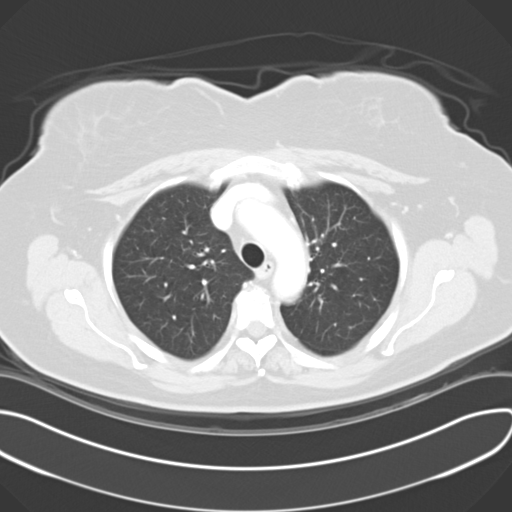
[im 115/123  soft-tissue]
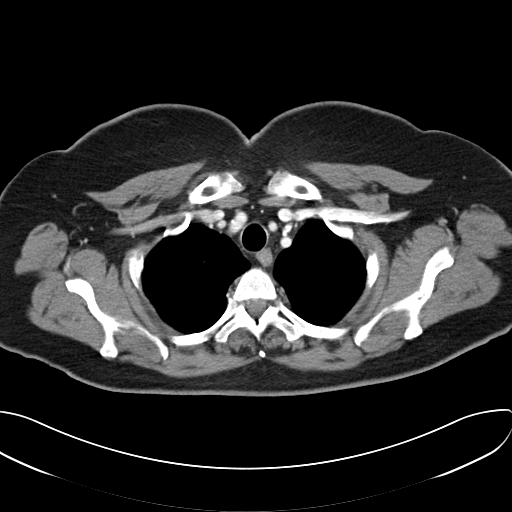
[im 115/123  lung]
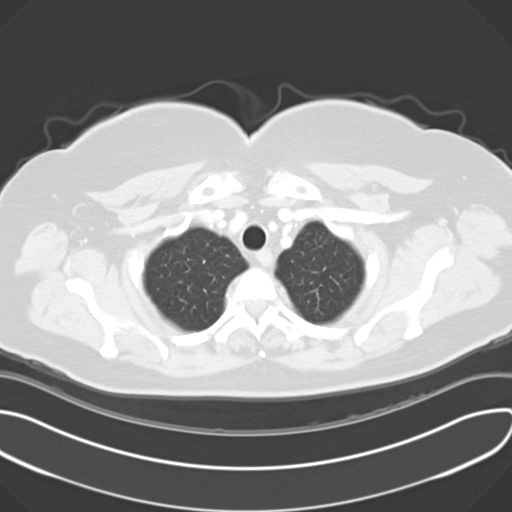

[14 of 32 positions shown; findings below may reference images not displayed]

FINDINGS: No mediastinal or hilar adenopathy.  The lungs appear clear.  There is no evidence of metastatic disease to the chest.
IMPRESSION: No CT evidence of metastatic disease to the chest. 
 CT OF THE ABDOMEN WITH CONTRAST, [DATE]:
FINDINGS: No liver mass is identified.  The spleen appears unremarkable as do the adrenal glands, pancreas, and kidneys.  Small retroperitoneal lymph nodes are not pathologically enlarged by CT size criteria.  There is some aortic atherosclerosis.
IMPRESSION: 1.  Prior cholecystectomy.  
 2.  No CT evidence of metastatic disease to the upper abdomen. 
 CT OF THE PELVIS WITH CONTRAST, [DATE]:
FINDINGS: Left-sided colostomy noted.  There is presacral soft tissue density likely from prior operative intervention.  The uterus appears somewhat posteriorly displaced as compared to the prior exam.  No definite signs of recurrence, and no pelvic adenopathy is noted.
IMPRESSION: Presacral soft tissue density, likely related to prior operative intervention and/or radiation therapy.  Careful observation of this region is likely warranted in order to further exclude the possibility of local recurrence.

## 2004-07-15 ENCOUNTER — Ambulatory Visit: Payer: Self-pay | Admitting: Hematology & Oncology

## 2004-09-20 ENCOUNTER — Ambulatory Visit (HOSPITAL_COMMUNITY): Admission: RE | Admit: 2004-09-20 | Discharge: 2004-09-20 | Payer: Self-pay | Admitting: Hematology & Oncology

## 2004-09-20 IMAGING — CT CT CHEST W/ CM
1 of 4 series · 13 of 30 positions shown, 17 images · IV contrast (agent unspecified)
Comparison: [DATE].

CLINICAL DATA: Rectal cancer.  Followup. 
CT CHEST WITH CONTRAST:
125 cc of contrast was utilized.

[Series 2: cap 5.0 b40f st · axial · 0.81mm/px · z∈[-598,-88]mm · 13 of 122 slices shown, 17 images]
[im 10/122  mediastinal]
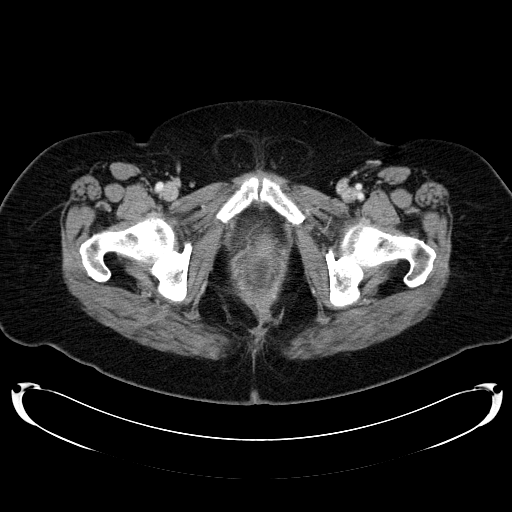
[im 10/122  lung]
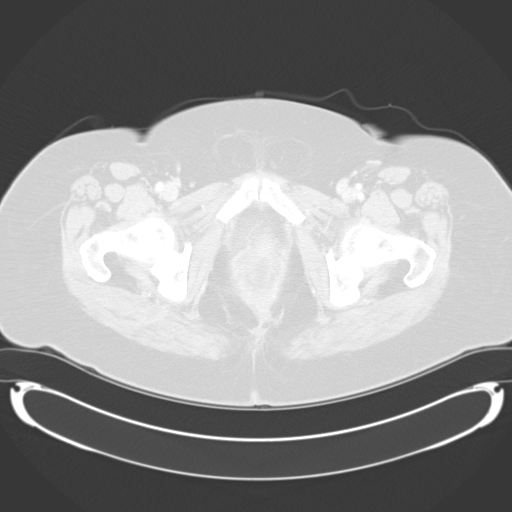
[im 19/122  lung]
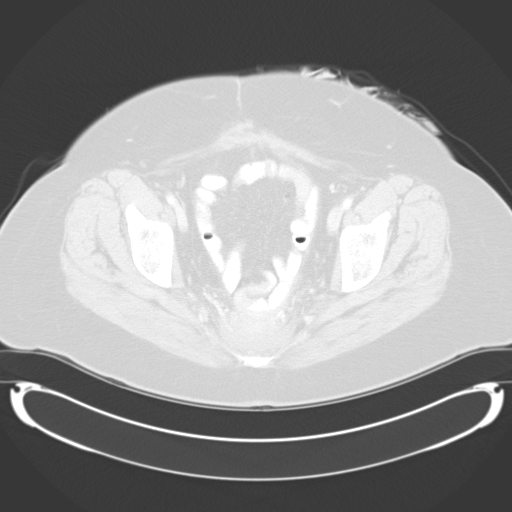
[im 28/122  lung]
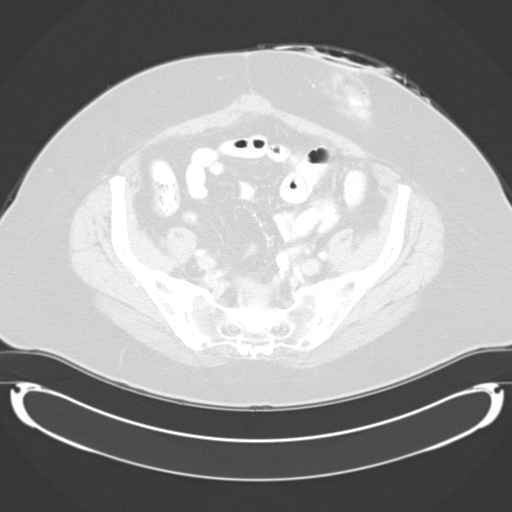
[im 38/122  lung]
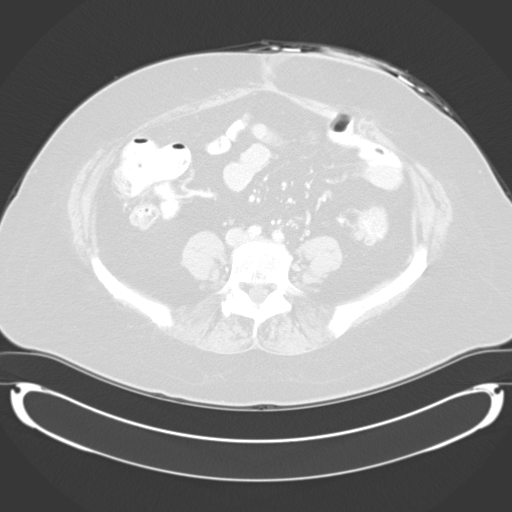
[im 47/122  mediastinal]
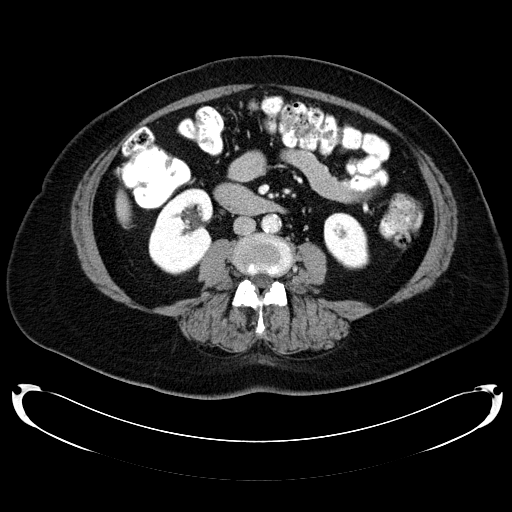
[im 47/122  lung]
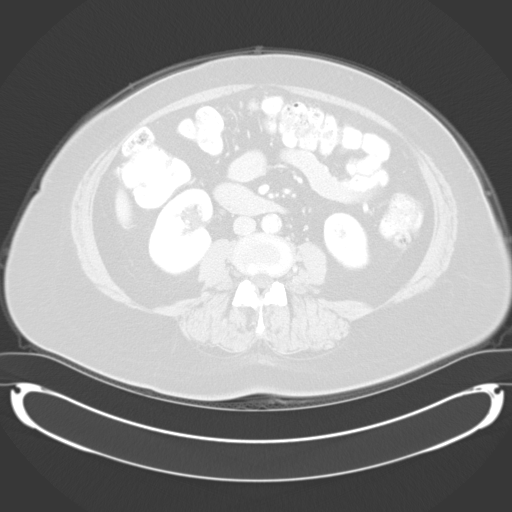
[im 56/122  lung]
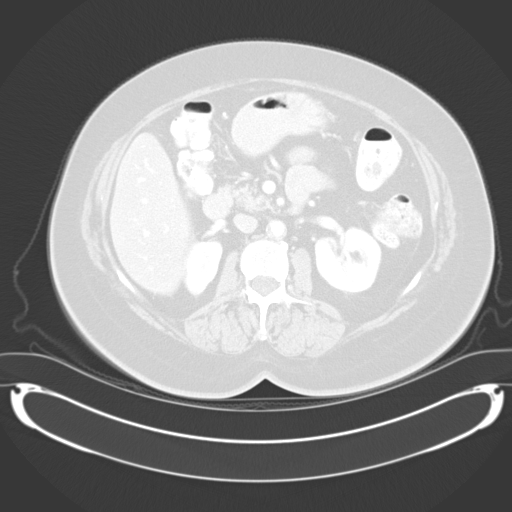
[im 60/122  lung]
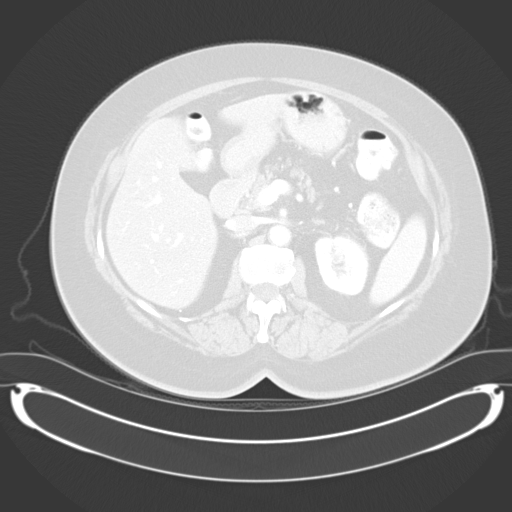
[im 66/122  lung]
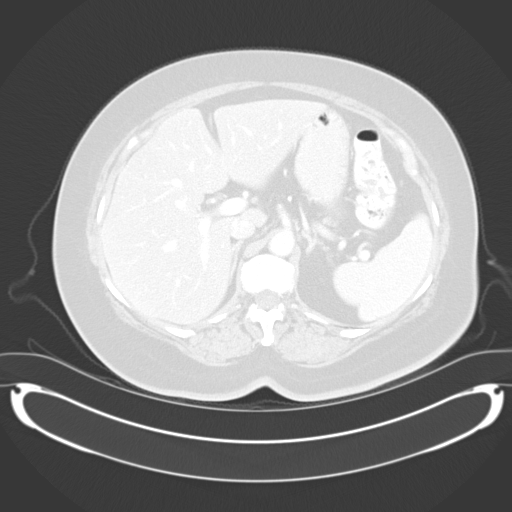
[im 75/122  mediastinal]
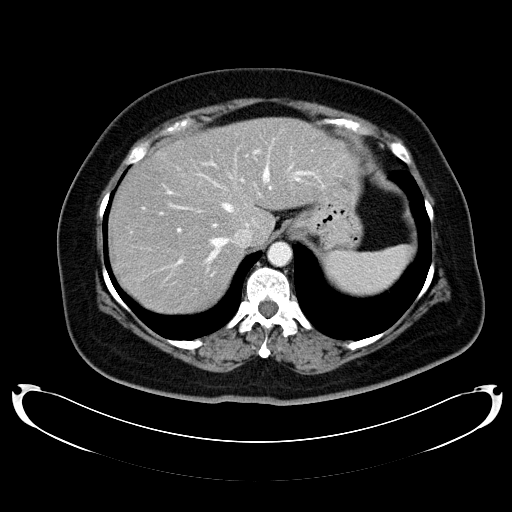
[im 75/122  lung]
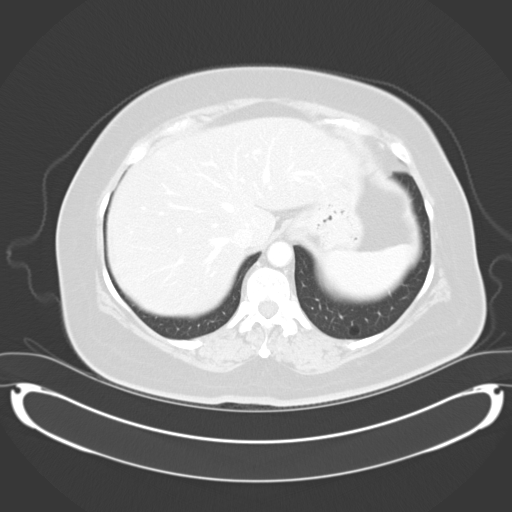
[im 84/122  lung]
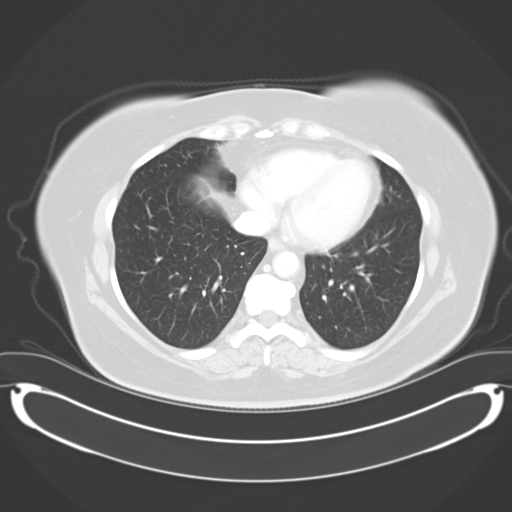
[im 94/122  lung]
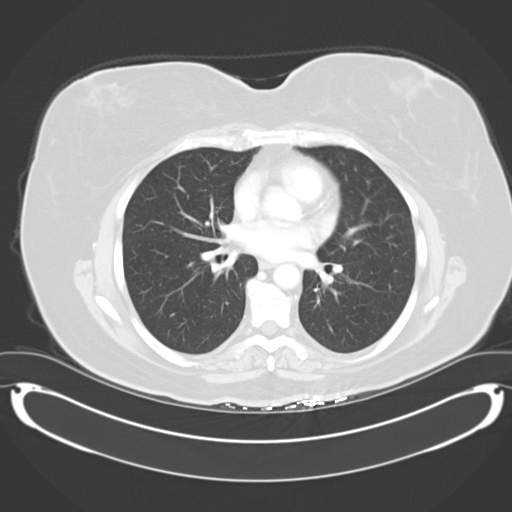
[im 103/122  lung]
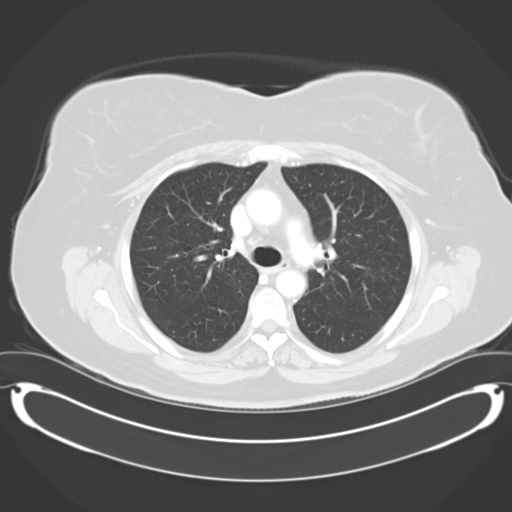
[im 112/122  mediastinal]
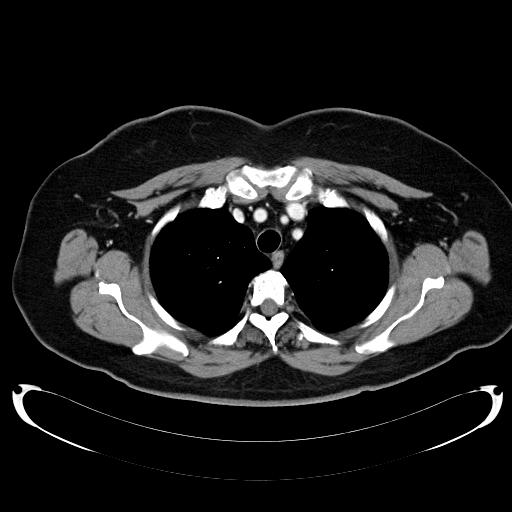
[im 112/122  lung]
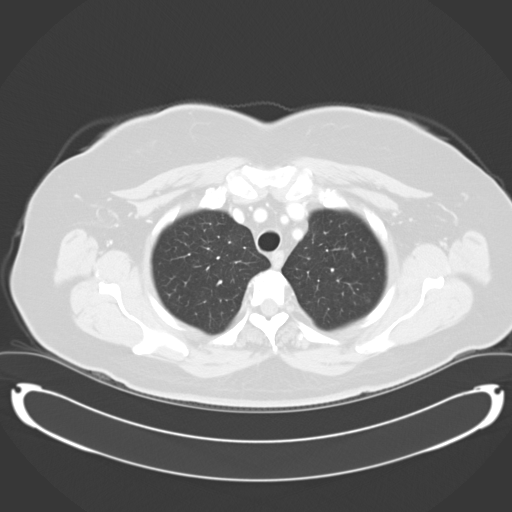

[13 of 30 positions shown; findings below may reference images not displayed]

Minimal focal haziness upper lung zones bilaterally (right=image 13 and left-image 11) stable possible representing areas of scarring.  Along the left lung base there is a peripherally located 3.3 mm nodule unchanged (image 46).  This will need to be followed until it has remained stable over a two year period.  Small bullae left lung base.  No mediastinal, hilar, or axillary adenopathy.  Mild atherosclerotic-type changes thoracic aorta and coronary arteries.  No bony destructive lesion.
IMPRESSION: 3.3 mm nodular opacity peripheral aspect left lung base stable.  Continued followup recommended. 
CT ABDOMEN WITH CONTRAST:
Status-post cholecystectomy.  Mild fatty infiltration of the liver without focal lesion identified.  The spleen, adrenal glands, kidneys, and pancreas are unremarkable.  Moderate atherosclerotic-type changes with calcification and plaque formation along the abdominal aorta without focal aneurysmal dilatation.  No surrounding adenopathy.  Degenerative changes of the lumbar spine without bony destructive lesion.
IMPRESSION: 1.  No evidence of metastatic disease in the region of the upper abdomen.
2.  Prominent atherosclerotic-type changes abdominal aorta without focal aneurysmal dilatation.
CT PELVIS WITH CONTRAST:
There is a stable appearance of presacral soft tissue prominence.  This may represent postoperative changes in this patient who has had a colectomy and left-sided colostomy formation.  Stability can be concluded over time.  Alternatively, PET CT imaging can be obtained at the present time if further delineation of this presacral soft tissue process is clinically desired.  No surrounding adenopathy.
IMPRESSION: Stable appearance of presacral soft tissue as discussed above.

## 2004-10-07 ENCOUNTER — Ambulatory Visit: Payer: Self-pay | Admitting: Hematology & Oncology

## 2004-12-06 ENCOUNTER — Ambulatory Visit: Payer: Self-pay | Admitting: Hematology & Oncology

## 2005-01-20 ENCOUNTER — Ambulatory Visit (HOSPITAL_COMMUNITY): Admission: RE | Admit: 2005-01-20 | Discharge: 2005-01-20 | Payer: Self-pay | Admitting: Hematology & Oncology

## 2005-01-20 IMAGING — CT CT ABDOMEN W/ CM
1 of 3 series · 14 of 32 positions shown, 19 images · IV contrast (omnipaque)
Comparison: [DATE].

CLINICAL DATA: Rectal cancer, post colon resection with colostomy. 
 ABDOMEN CT WITH CONTRAST:
TECHNIQUE: Multidetector CT imaging of the abdomen was performed following the standard protocol during bolus administration of intravenous contrast.
 Contrast:  125 cc Omnipaque 300.  Oral contrast was administered.
TECHNIQUE: Multidetector CT imaging of the pelvis was performed following the standard protocol during bolus administration of intravenous contrast.
 Left lower quadrant colostomy as before.  Presacral soft tissue fullness without mass effect or interval change.  This is likely post-op or post-radiation therapy scar.  Looking back at an old study from [DATE], it has not changed.  This is unlikely to be active malignancy.   Pelvic sidewalls normal.  No adenopathy or fluid collections.

[Series 2: abd_pel 5.0 b40f st · axial · 0.71mm/px · z∈[-557,-142]mm · 14 of 93 slices shown, 19 images]
[im 5/93  soft-tissue]
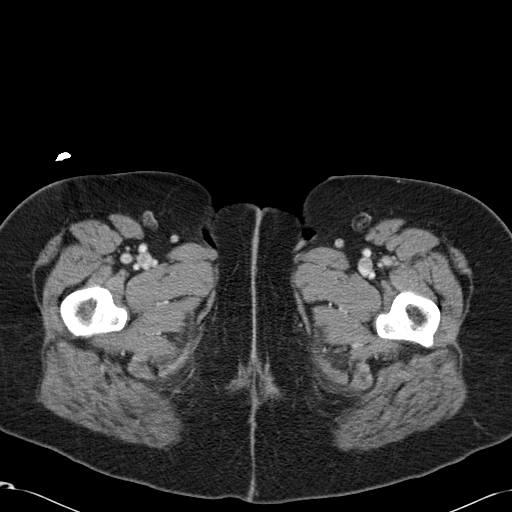
[im 5/93  bone]
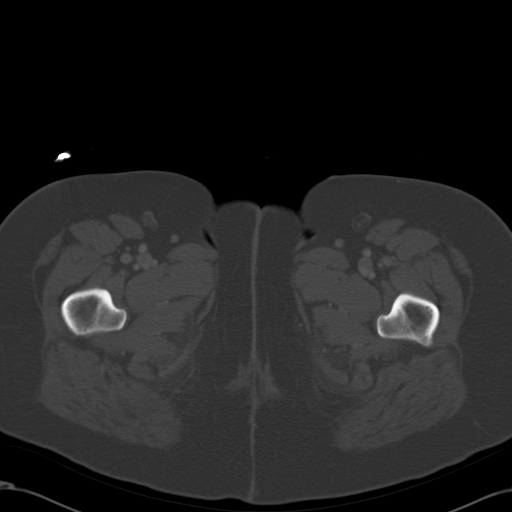
[im 15/93  soft-tissue]
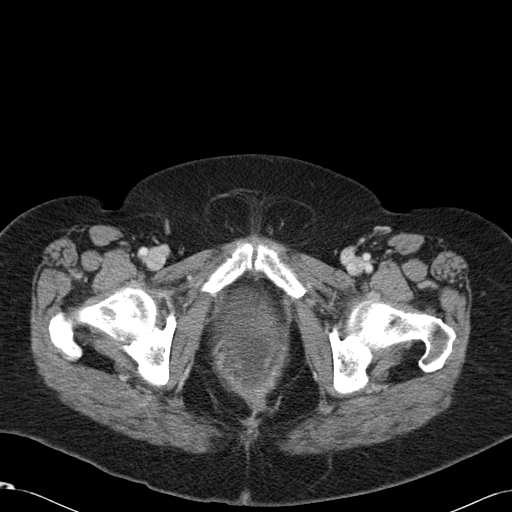
[im 20/93  soft-tissue]
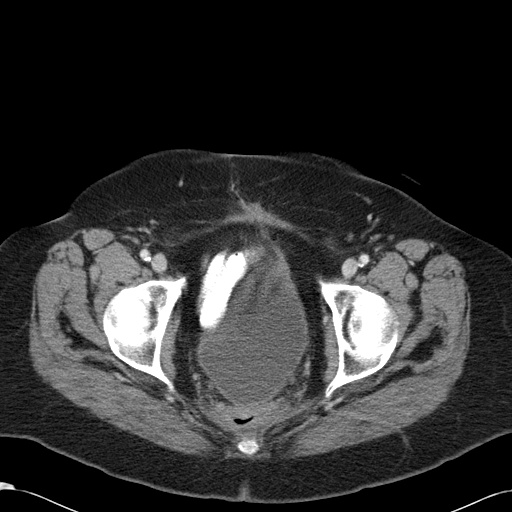
[im 25/93  soft-tissue]
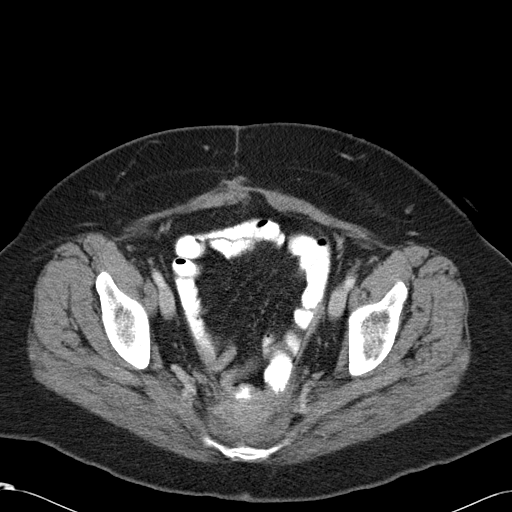
[im 34/93  soft-tissue]
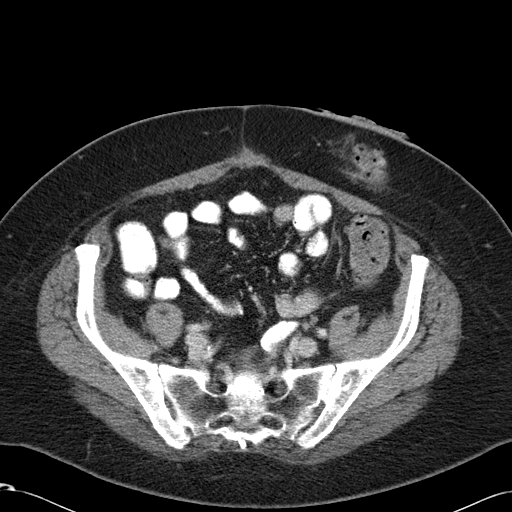
[im 39/93  soft-tissue]
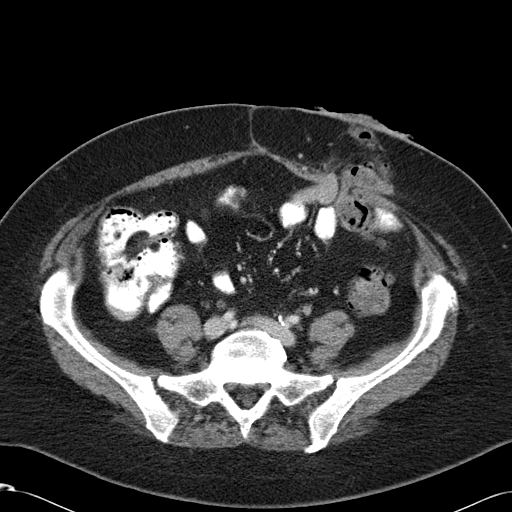
[im 49/93  soft-tissue]
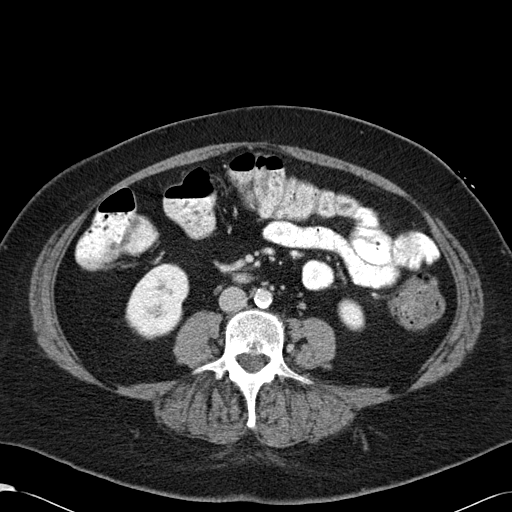
[im 54/93  soft-tissue]
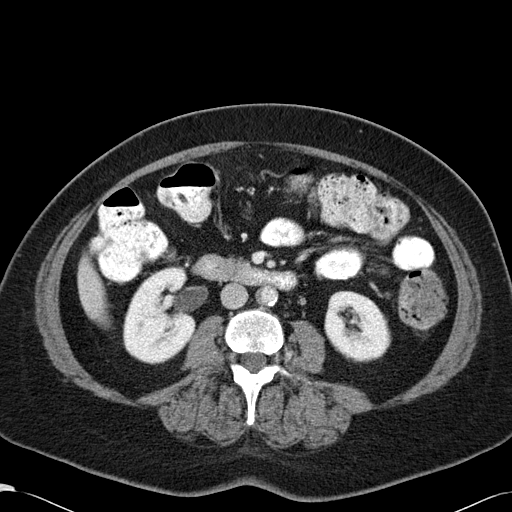
[im 59/93  soft-tissue]
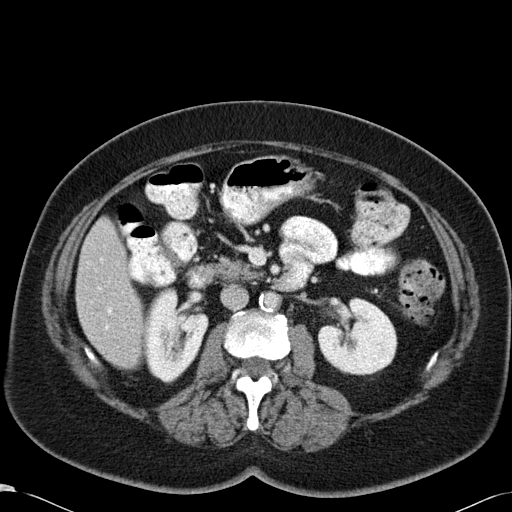
[im 59/93  bone]
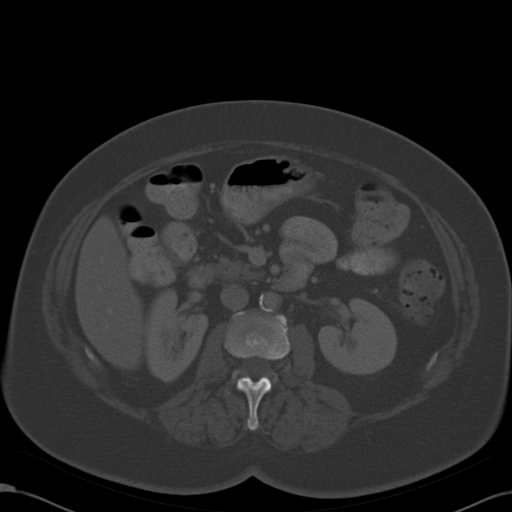
[im 68/93  soft-tissue]
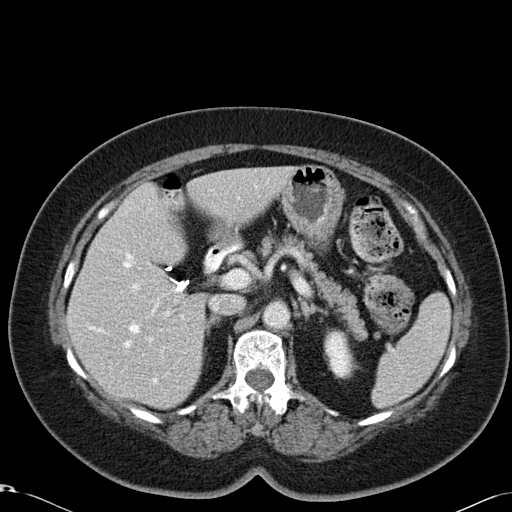
[im 73/93  soft-tissue]
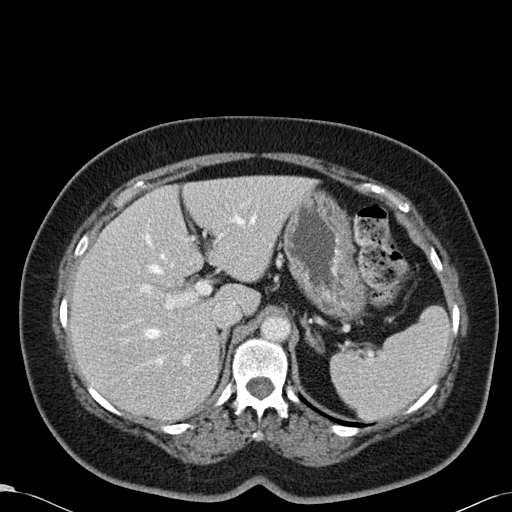
[im 73/93  lung]
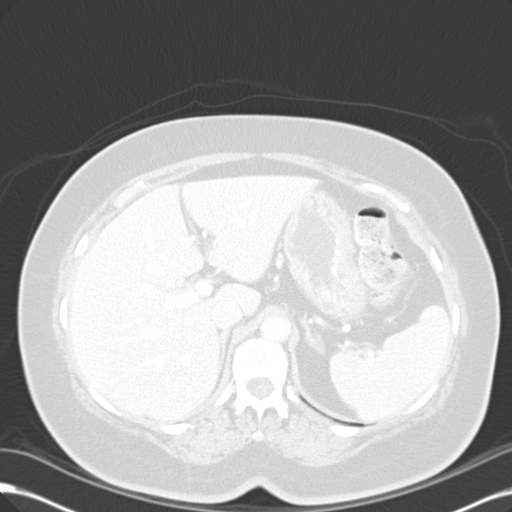
[im 78/93  soft-tissue]
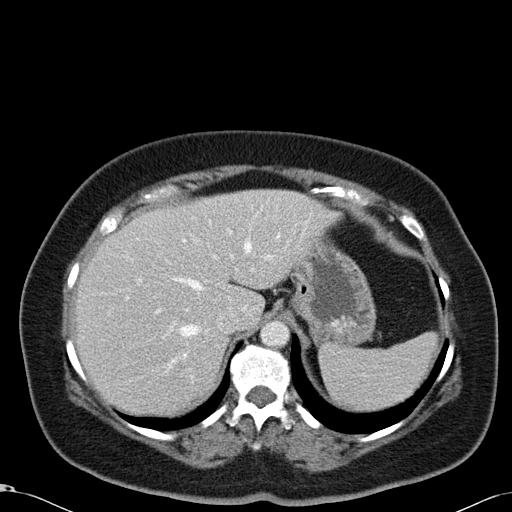
[im 78/93  lung]
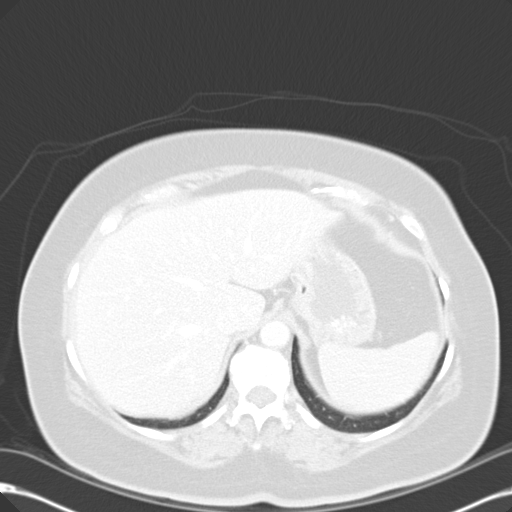
[im 83/93  lung]
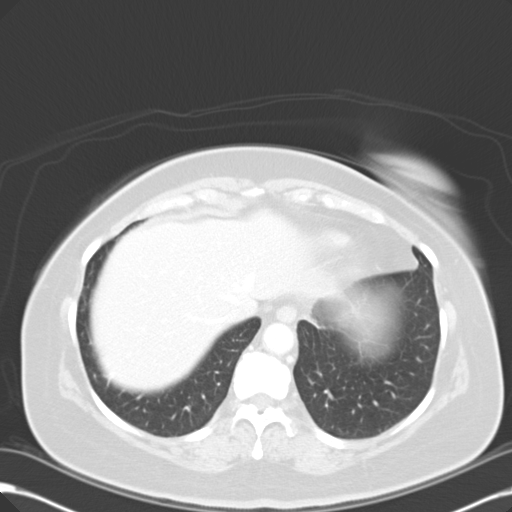
[im 88/93  soft-tissue]
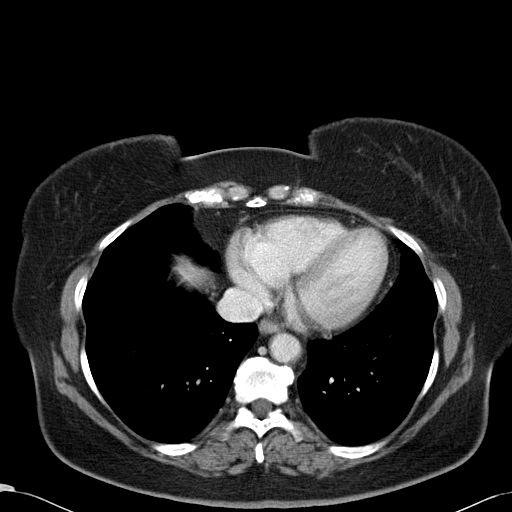
[im 88/93  lung]
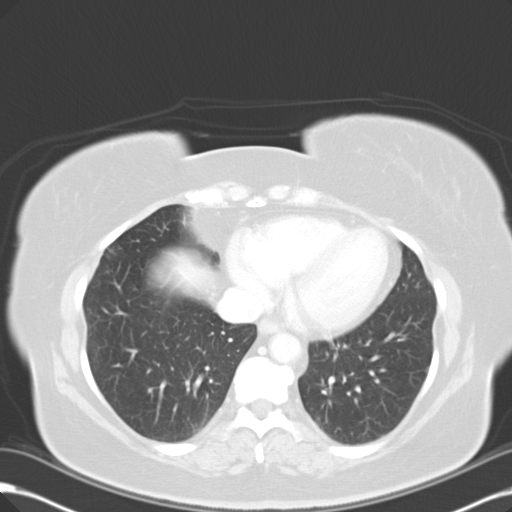

[14 of 32 positions shown; findings below may reference images not displayed]

Tiny nodule at the left lung base is stable measuring only a few millimeters.  Lung bases otherwise clear.  No liver, splenic or adrenal metastases.  Pancreas normal.  Prior cholecystectomy. No biliary dilatation.  No adenopathy or ascites.  Atheromatous plaquing of the aorta again noted.  
 The kidneys remain normal.
IMPRESSION: No evidence for metastatic disease or significant change ? there is atheromatous plaquing of the aorta.  
 PELVIS CT WITH CONTRAST:
IMPRESSION: Fullness in the presacral space stable since [DATE].   Likely postoperative change.  See report.

## 2005-02-16 ENCOUNTER — Ambulatory Visit: Payer: Self-pay | Admitting: Hematology & Oncology

## 2005-07-06 ENCOUNTER — Ambulatory Visit: Payer: Self-pay | Admitting: Hematology & Oncology

## 2005-07-11 ENCOUNTER — Ambulatory Visit (HOSPITAL_COMMUNITY): Admission: RE | Admit: 2005-07-11 | Discharge: 2005-07-11 | Payer: Self-pay | Admitting: Hematology & Oncology

## 2005-07-11 IMAGING — CT CT PELVIS W/ CM
2 of 5 series · 16 of 46 positions shown, 18 images · IV contrast (omnipaque)
Comparison: [DATE]
COMPARISON: [DATE].

CLINICAL DATA: Rectal cancer.  
 CHEST CT WITH CONTRAST:
TECHNIQUE: Multidetector CT imaging of the chest was performed following the standard protocol during bolus administration of intravenous contrast.
 Contrast:  125 cc Omnipaque 300
TECHNIQUE: Multidetector CT imaging of the abdomen was performed following the standard protocol during bolus administration of intravenous contrast.
TECHNIQUE: Multidetector CT imaging of the pelvis was performed following the standard protocol during bolus administration of intravenous contrast.

[Series 2: cap 5.0 b40s st · axial · 0.77mm/px · z∈[-650,-96]mm · 13 of 125 slices shown, 15 images]
[im 7/125  soft-tissue]
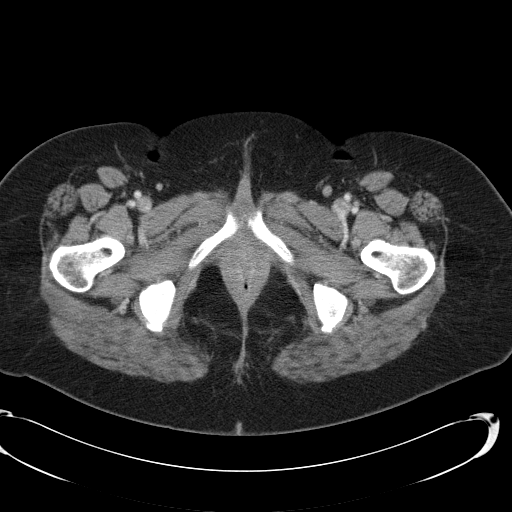
[im 7/125  bone]
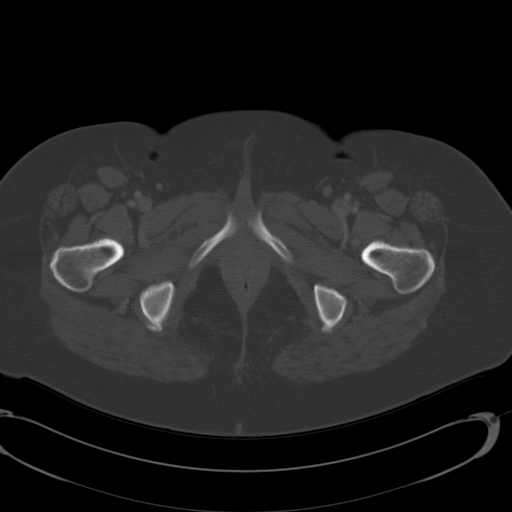
[im 20/125  soft-tissue]
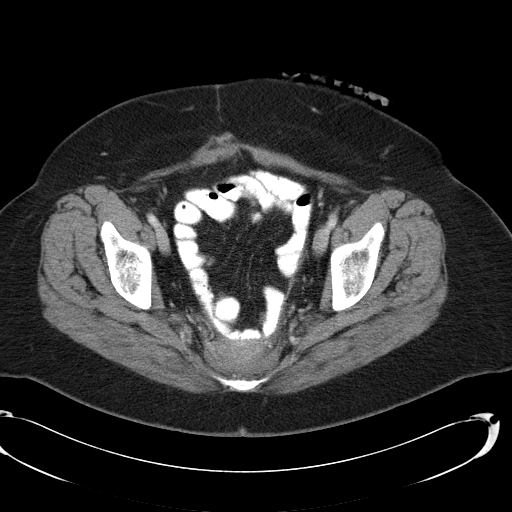
[im 27/125  soft-tissue]
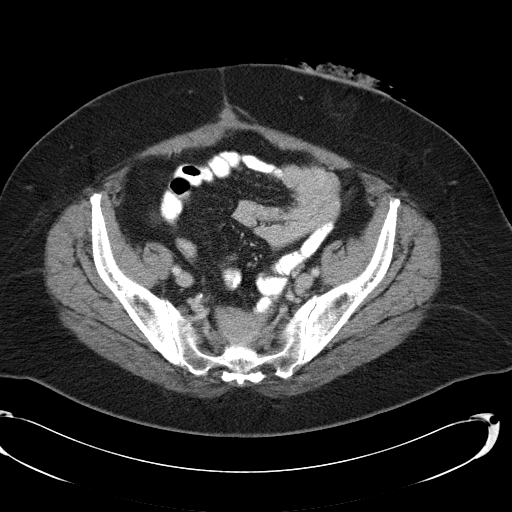
[im 33/125  soft-tissue]
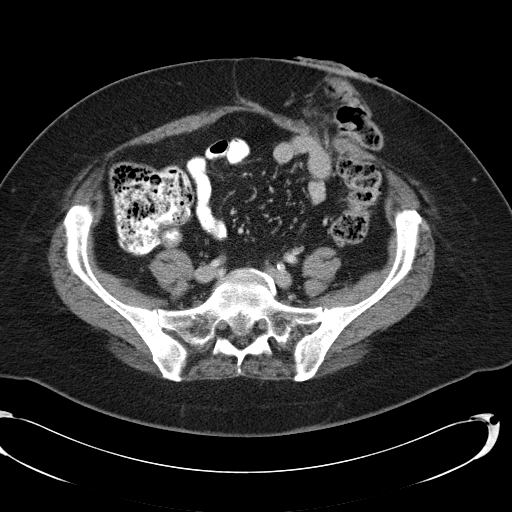
[im 46/125  soft-tissue]
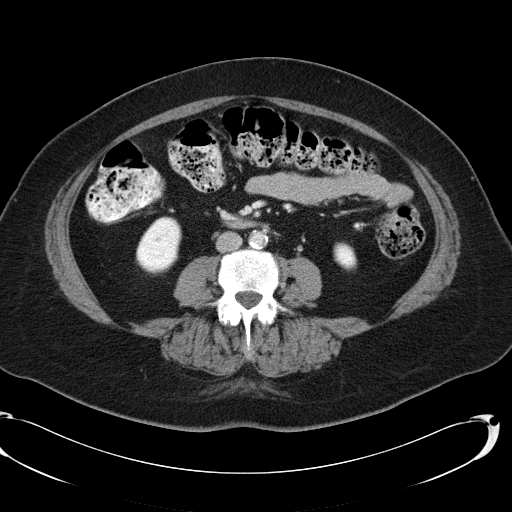
[im 53/125  soft-tissue]
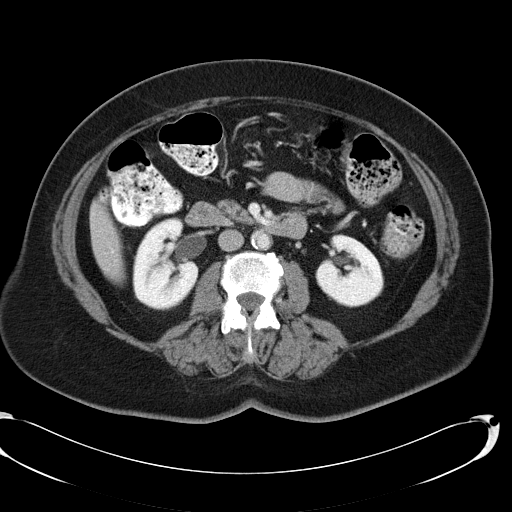
[im 66/125  soft-tissue]
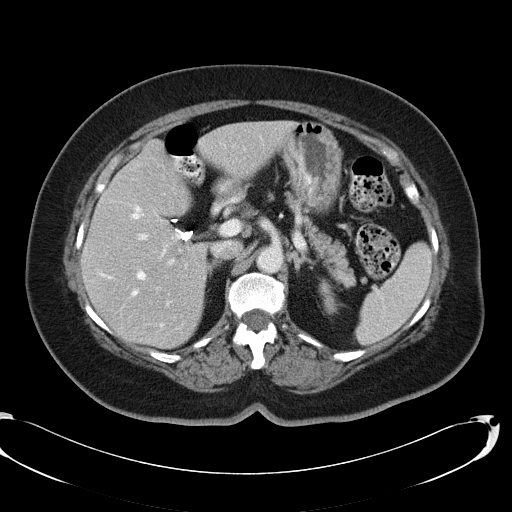
[im 72/125  soft-tissue]
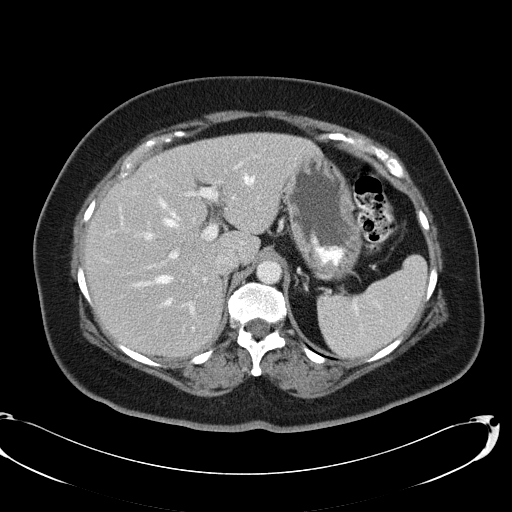
[im 79/125  soft-tissue]
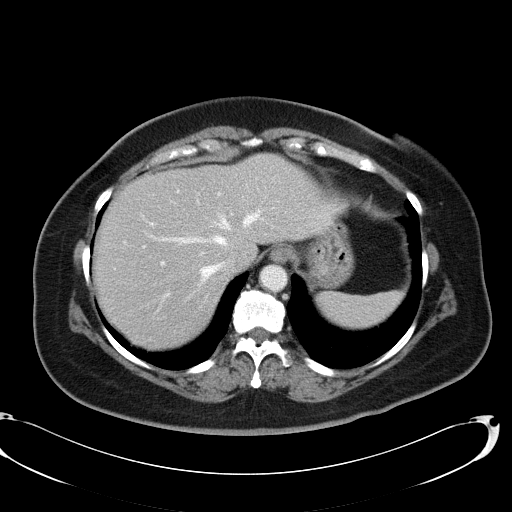
[im 79/125  bone]
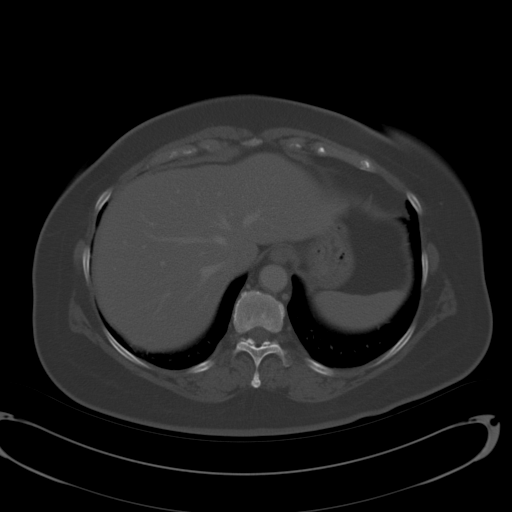
[im 92/125  soft-tissue]
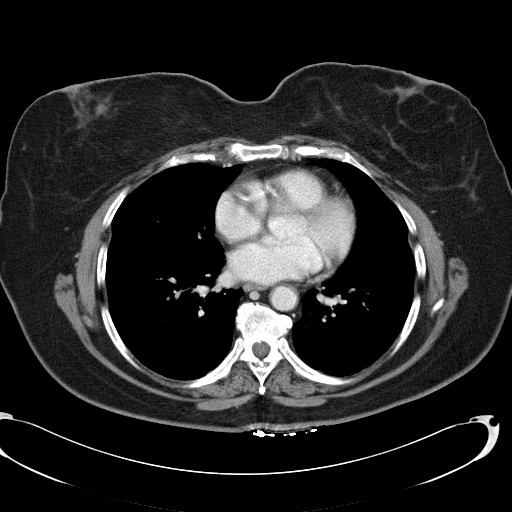
[im 98/125  soft-tissue]
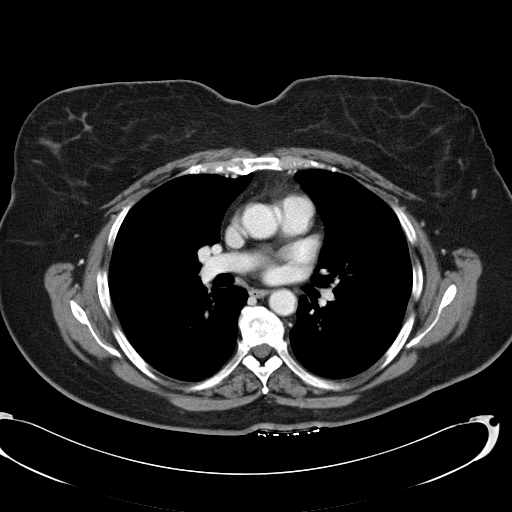
[im 105/125  soft-tissue]
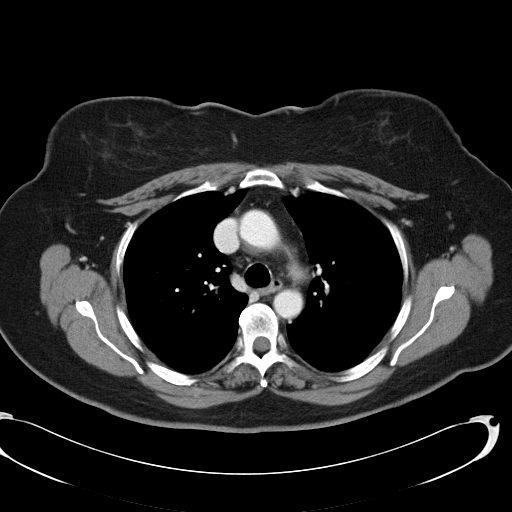
[im 118/125  soft-tissue]
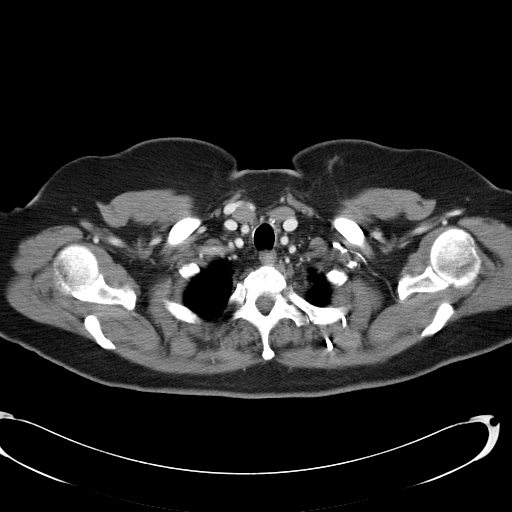

[Series 602: coronal images · coronal · 1.25mm/px · 3 of 39 slices shown]
[im 13/39  soft-tissue]
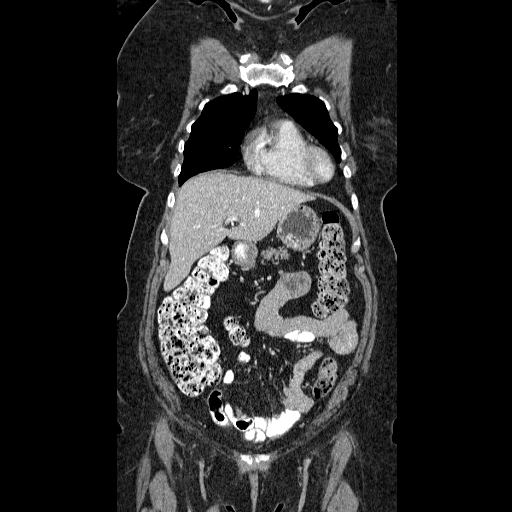
[im 17/39  soft-tissue]
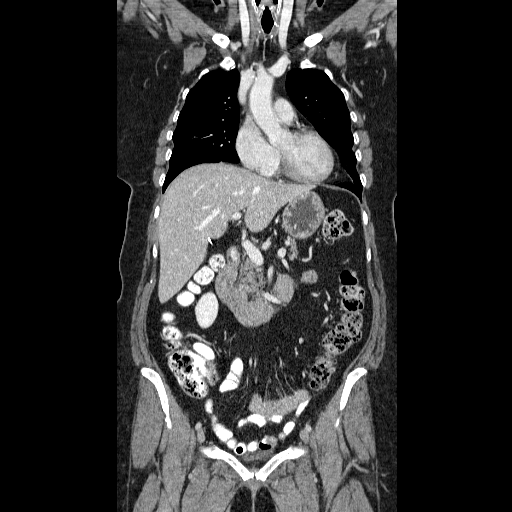
[im 22/39  soft-tissue]
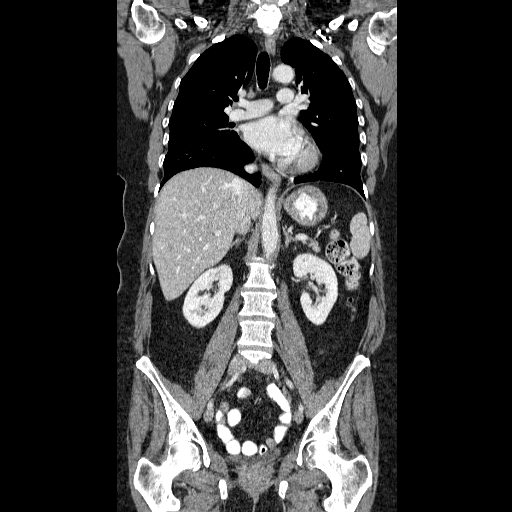

[16 of 46 positions shown; findings below may reference images not displayed]

FINDINGS: There are no pathologically enlarged right or left axillary lymph nodes.  
 There is no pathologically enlarged mediastinal or hilar lymphadenopathy.  
 No pleural or pericardial effusion.  
 Review of bone windows is unremarkable.  
 Small nodule right upper lobe (image 20) measures 3.4 mm.  When compared with the prior exam this nodule is stable.
IMPRESSION: Stable small 3 mm nodule right upper lobe which is nonspecific and may be the sequela of prior inflammation or infection.  No new suspicious pulmonary nodules or masses noted.  
 ABDOMEN CT WITH CONTRAST:
FINDINGS: Patient is status post cholecystectomy.  There are no focal liver lesions.  There is no intrahepatic biliary ductal dilatation. 
 The spleen is negative. 
 The pancreas is normal.  
 Adrenal glands are negative. 
 Small low attenuation lesion in the interpolar region of the right kidney which measured 5.9 mm (image 71). 
 No pathologically enlarged retroperitoneal lymphadenopathy.  No mesenteric lymphadenopathy.  Review of the bone windows shows no lytic or sclerotic lesions.
IMPRESSION: No evidence for abdominal metastases.  
 PELVIS CT WITH CONTRAST:
FINDINGS: Left lower quadrant colostomy is identified.  No iliac lymphadenopathy.  The urinary bladder is negative.  Presacral soft tissue is again noted.  No free fluid.  Visualized pelvic bowel loops are negative.  
 No inguinal lymphadenopathy.  
 Review of the bone windows show no lytic or sclerotic lesion.
IMPRESSION: 1.  Stable CT of the pelvis.  
 2.  No change in presacral soft tissue likely postoperative change.

## 2005-08-10 LAB — COMPREHENSIVE METABOLIC PANEL
ALT: 25 U/L (ref 0–40)
AST: 15 U/L (ref 0–37)
Albumin: 4.4 g/dL (ref 3.5–5.2)
Alkaline Phosphatase: 98 U/L (ref 39–117)
Potassium: 4 mEq/L (ref 3.5–5.3)
Sodium: 140 mEq/L (ref 135–145)
Total Bilirubin: 0.4 mg/dL (ref 0.3–1.2)
Total Protein: 6.9 g/dL (ref 6.0–8.3)

## 2005-08-10 LAB — CBC WITH DIFFERENTIAL/PLATELET
BASO%: 0.6 % (ref 0.0–2.0)
EOS%: 6 % (ref 0.0–7.0)
LYMPH%: 24.3 % (ref 14.0–48.0)
MCHC: 34.3 g/dL (ref 32.0–36.0)
MCV: 82.3 fL (ref 81.0–101.0)
MONO%: 9.1 % (ref 0.0–13.0)
NEUT#: 3 10*3/uL (ref 1.5–6.5)
Platelets: 259 10*3/uL (ref 145–400)
RBC: 4.21 10*6/uL (ref 3.70–5.32)
RDW: 14.4 % (ref 11.3–14.5)

## 2006-01-30 ENCOUNTER — Ambulatory Visit: Payer: Self-pay | Admitting: Hematology & Oncology

## 2006-02-01 LAB — CBC WITH DIFFERENTIAL/PLATELET
Basophils Absolute: 0 10*3/uL (ref 0.0–0.1)
EOS%: 4.5 % (ref 0.0–7.0)
HGB: 12.3 g/dL (ref 11.6–15.9)
MCH: 29 pg (ref 26.0–34.0)
MCV: 83.9 fL (ref 81.0–101.0)
MONO%: 9.3 % (ref 0.0–13.0)
RBC: 4.24 10*6/uL (ref 3.70–5.32)
RDW: 14.5 % (ref 11.3–14.5)

## 2006-02-01 LAB — COMPREHENSIVE METABOLIC PANEL
AST: 18 U/L (ref 0–37)
Albumin: 4.5 g/dL (ref 3.5–5.2)
Alkaline Phosphatase: 96 U/L (ref 39–117)
BUN: 13 mg/dL (ref 6–23)
Calcium: 8.9 mg/dL (ref 8.4–10.5)
Chloride: 100 mEq/L (ref 96–112)
Glucose, Bld: 145 mg/dL — ABNORMAL HIGH (ref 70–99)
Potassium: 4.3 mEq/L (ref 3.5–5.3)
Sodium: 137 mEq/L (ref 135–145)
Total Protein: 7 g/dL (ref 6.0–8.3)

## 2006-02-05 ENCOUNTER — Ambulatory Visit (HOSPITAL_COMMUNITY): Admission: RE | Admit: 2006-02-05 | Discharge: 2006-02-05 | Payer: Self-pay | Admitting: Hematology & Oncology

## 2006-02-05 IMAGING — CT CT CHEST W/ CM
2 of 4 series · 16 of 46 positions shown, 18 images · IV contrast (omnipaque)
Comparison: none

CLINICAL DATA: Rectal cancer follow-up.  Status post chemotherapy and radiation therapy as well as colostomy.  No chest or abdominal complaints.  History of prior appendectomy and right oophorectomy.  
CHEST CT WITH CONTRAST ? [DATE]:
TECHNIQUE: Multidetector CT imaging of the chest was performed following the standard protocol during bolus administration of intravenous contrast.   
Contrast:  125 cc Omnipaque 300 IV. 
Comparison Examination:   [DATE].
TECHNIQUE: Multidetector CT imaging of the abdomen was performed following the standard protocol during bolus administration of intravenous contrast.
TECHNIQUE: Multidetector CT imaging of the pelvis was performed following the standard protocol during bolus administration of intravenous contrast.

[Series 2: cap 5.0 b40f · axial · 0.80mm/px · z∈[-616,-61]mm · 13 of 123 slices shown, 15 images]
[im 6/123  soft-tissue]
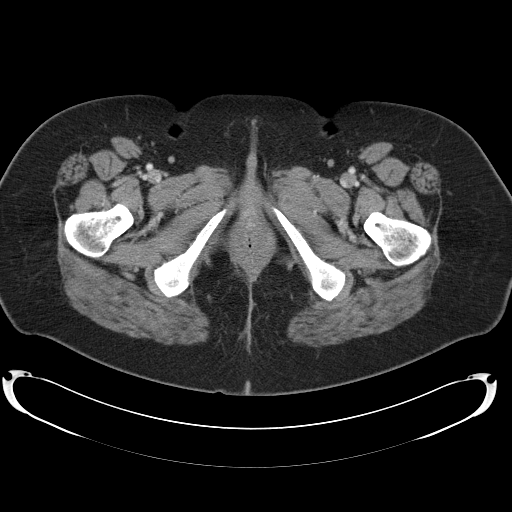
[im 6/123  bone]
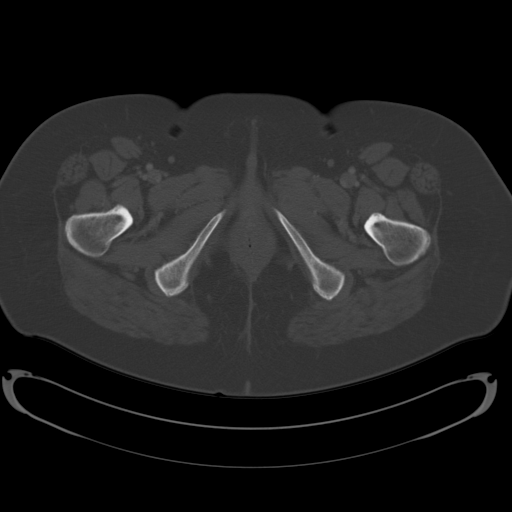
[im 18/123  soft-tissue]
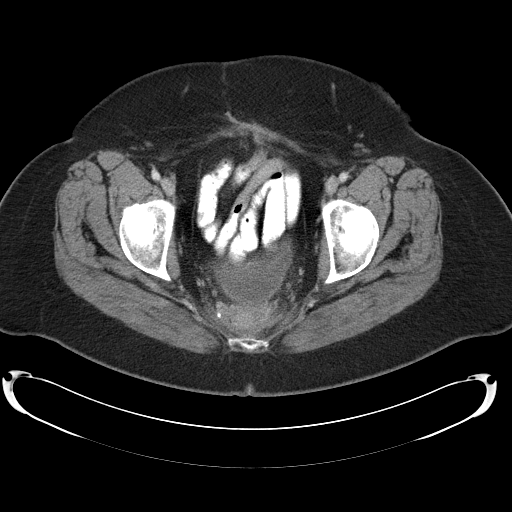
[im 24/123  soft-tissue]
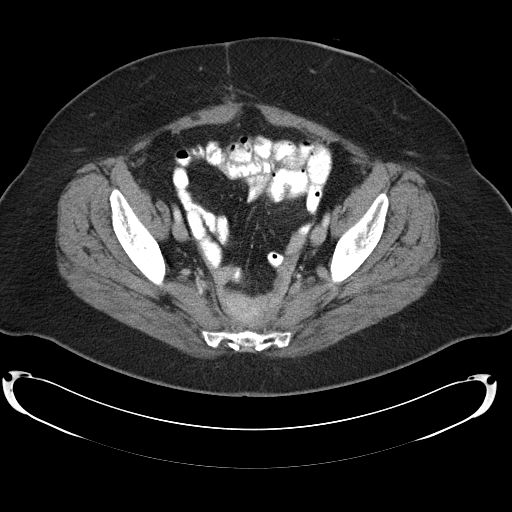
[im 35/123  soft-tissue]
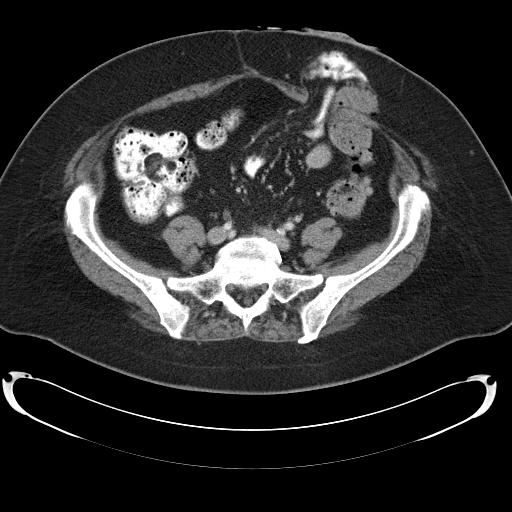
[im 41/123  soft-tissue]
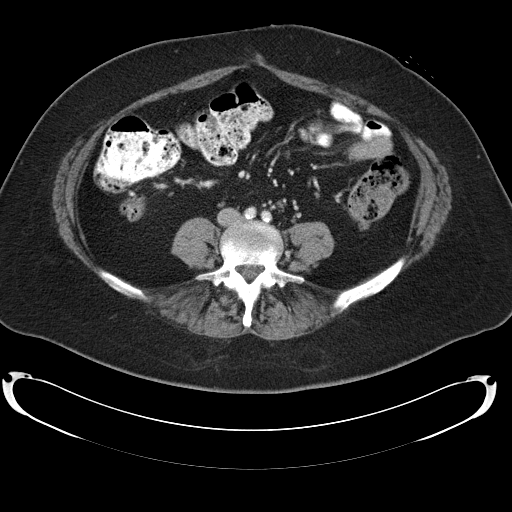
[im 53/123  soft-tissue]
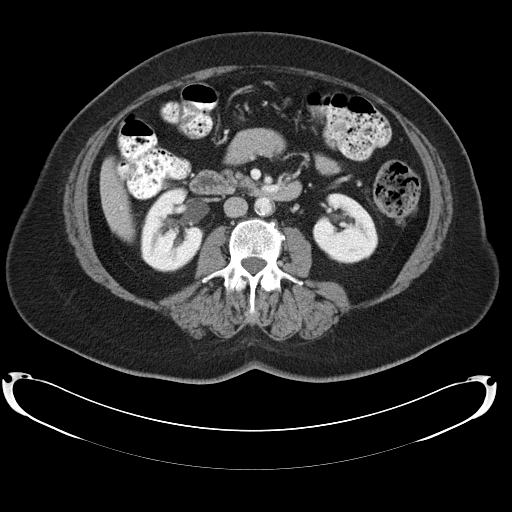
[im 64/123  soft-tissue]
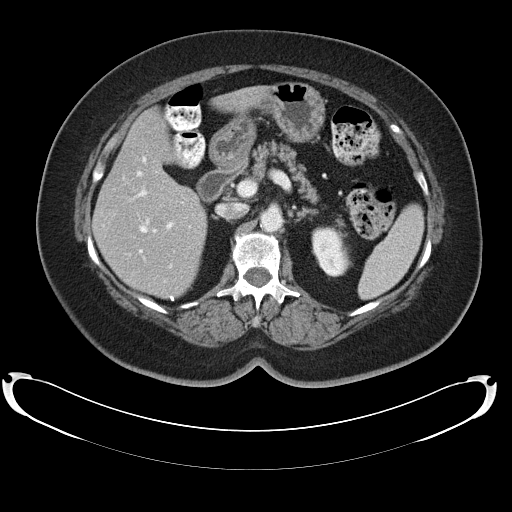
[im 70/123  soft-tissue]
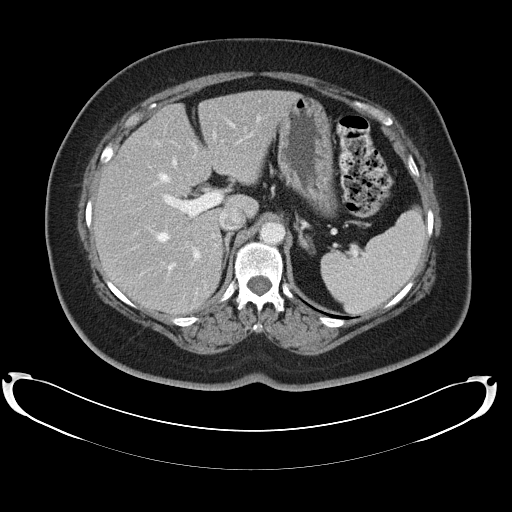
[im 82/123  soft-tissue]
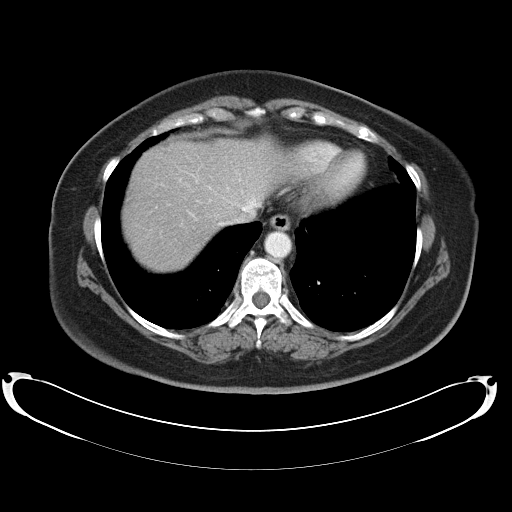
[im 82/123  bone]
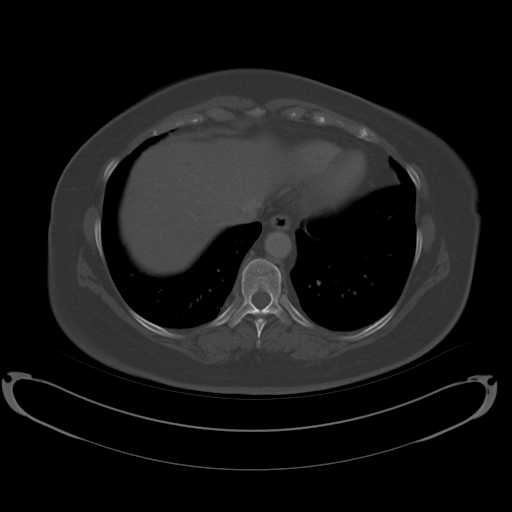
[im 88/123  soft-tissue]
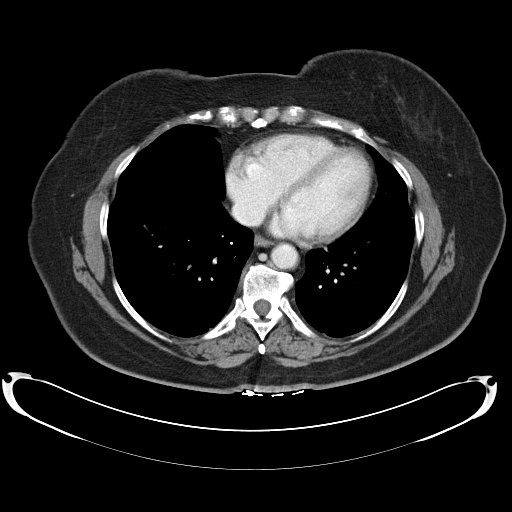
[im 99/123  soft-tissue]
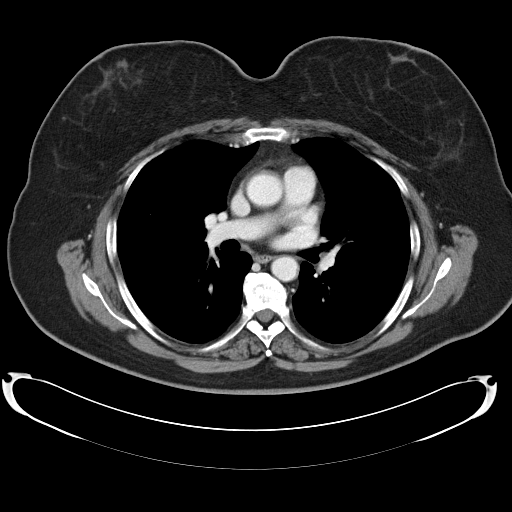
[im 105/123  soft-tissue]
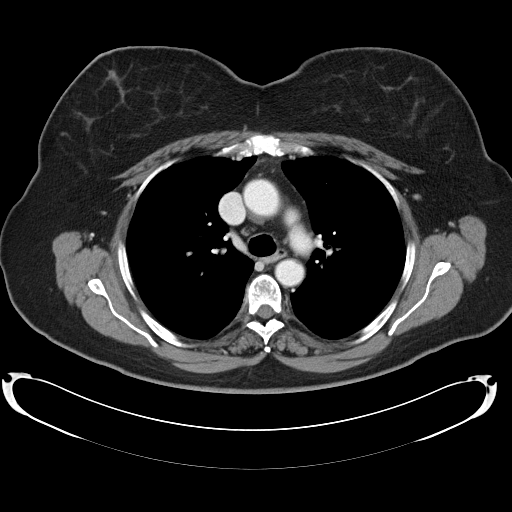
[im 117/123  soft-tissue]
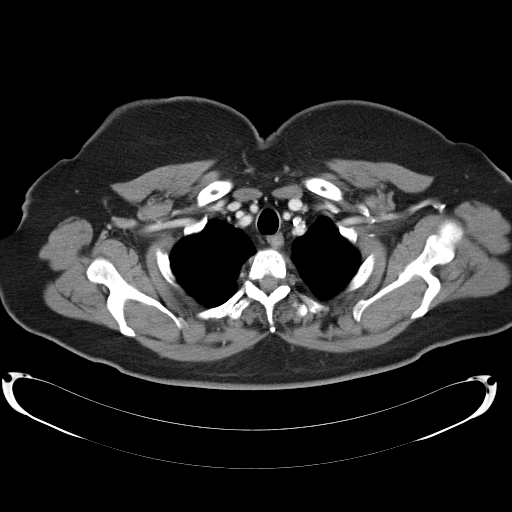

[Series 4: coronal mpr · coronal · 1.18mm/px · 3 of 43 slices shown]
[im 15/43  soft-tissue]
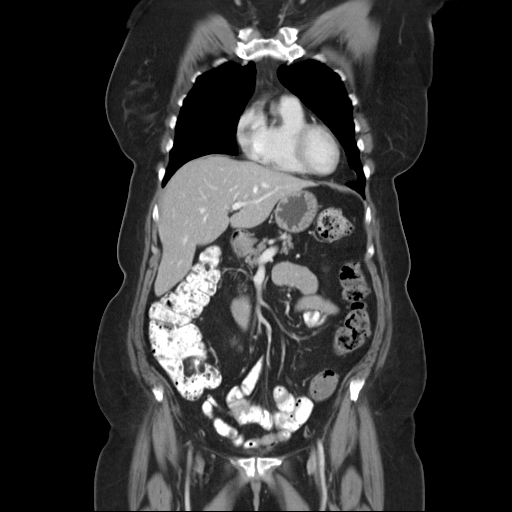
[im 19/43  soft-tissue]
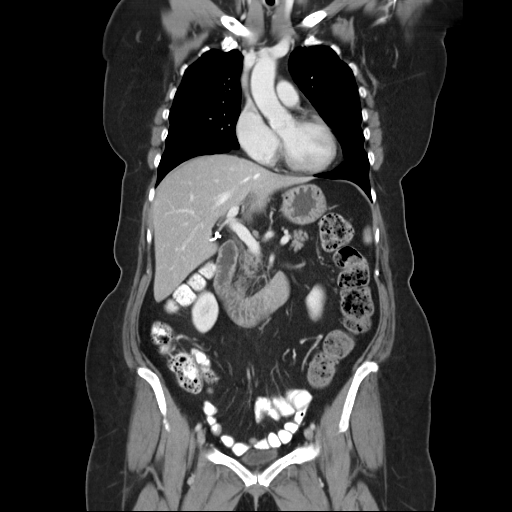
[im 24/43  soft-tissue]
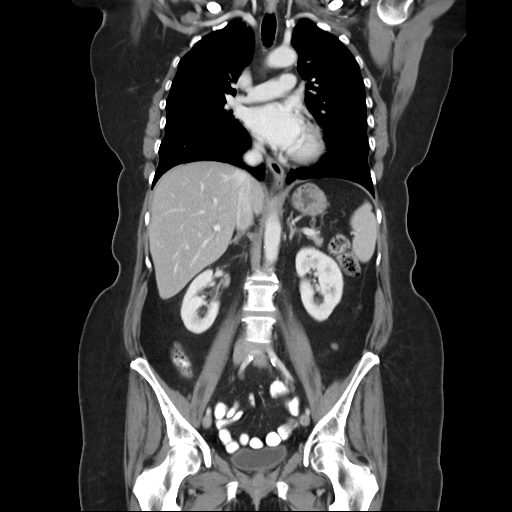

[16 of 46 positions shown; findings below may reference images not displayed]

FINDINGS: There are scattered pulmonary parenchymal changes, the majority of which appear similar to that on the prior examination (see images 6, 13, 14, 17, 21, 36, 39, and 40).  New pulmonary parenchymal changes in the medial posterior lung zones bilaterally are suggestive of atelectatic changes.  New since the prior examination is a 0.7 x 1 x 0.6 cm lesion within the superior aspect of the left lower lung zone (image 26).  Etiology indeterminate.  Malignancy cannot be excluded.  A right hilar 1 cm low density lymph node is unchanged (image 23).  Atherosclerotic type changes with calcification and plaque formation along the aorta and origin of the great vessels, as well as partial calcification of the coronary arteries.  No bony destructive lesion.
IMPRESSION: New since the prior examination is a left upper lobe lesion measuring up to 1 cm.  Etiology is indeterminate.  Malignancy cannot be excluded.  Please see above.
ABDOMEN CT WITH CONTRAST ? [DATE]:
FINDINGS: Minimal nodularity adjacent to the done of the right lobe of liver (image 43) is unchanged.  No focal hepatic, splenic, adrenal, pancreatic, or left renal lesion.  A right renal 0.9 x 0.5 cm low density lesion is too small to adequately characterize but appears relatively similar to the prior examination and may be a small cyst.  Atherosclerotic type changes of the aorta without aneurysmal dilatation.  No retroperitoneal or mesenteric adenopathy.  Prior colostomy in the left lower quadrant.  Small bowel enters through this region but does not appear to be causing obstruction presently.
IMPRESSION: No focal hepatic lesion suspicious for spread of tumor.  No retroperitoneal  adenopathy.
PELVIS CT WITH CONTRAST ? [DATE]:
FINDINGS: Status post colonic resection with soft tissue prominence in the presacral region.  This is similar to the prior examination.  No surrounding adenopathy.  No bony destructive lesion.
IMPRESSION: Presacral soft tissue prominence appears similar to the prior exam.

## 2006-02-05 IMAGING — CT CT CHEST W/ CM
1 of 2 series · 13 of 32 positions shown, 18 images · IV contrast (omnipaque)
Comparison: none

CLINICAL DATA: Rectal cancer follow-up.  Status post chemotherapy and radiation therapy as well as colostomy.  No chest or abdominal complaints.  History of prior appendectomy and right oophorectomy.  
CHEST CT WITH CONTRAST ? [DATE]:
TECHNIQUE: Multidetector CT imaging of the chest was performed following the standard protocol during bolus administration of intravenous contrast.   
Contrast:  125 cc Omnipaque 300 IV. 
Comparison Examination:   [DATE].
TECHNIQUE: Multidetector CT imaging of the abdomen was performed following the standard protocol during bolus administration of intravenous contrast.
TECHNIQUE: Multidetector CT imaging of the pelvis was performed following the standard protocol during bolus administration of intravenous contrast.

[Series 2: kidney del 5.0 b40f · axial · 0.79mm/px · z∈[-423,-293]mm · 13 of 30 slices shown, 18 images]
[im 2/30  soft-tissue]
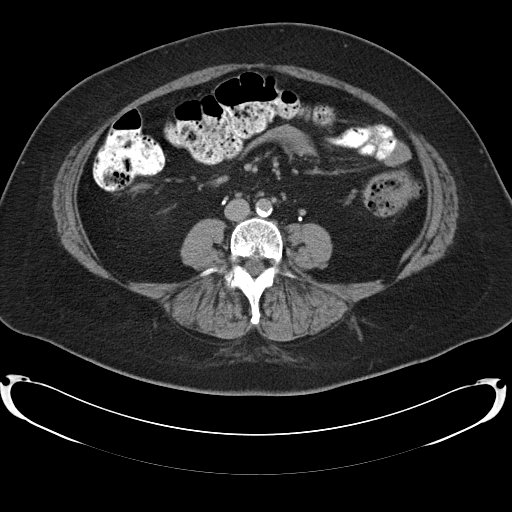
[im 2/30  bone]
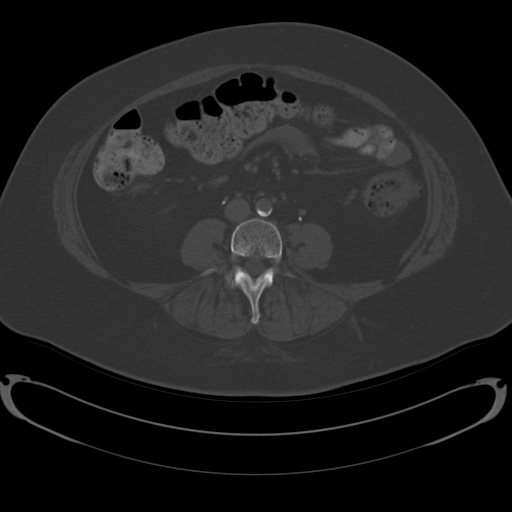
[im 6/30  soft-tissue]
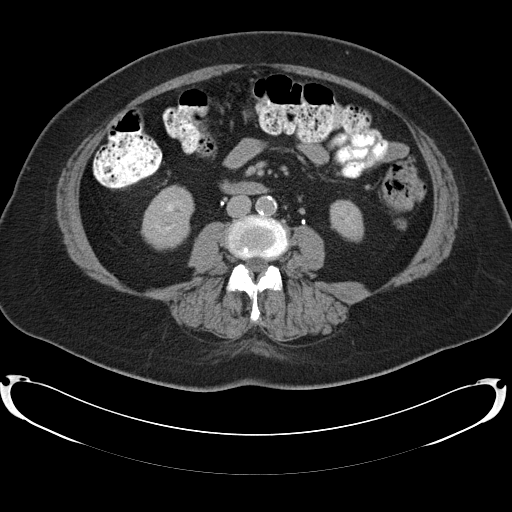
[im 7/30  soft-tissue]
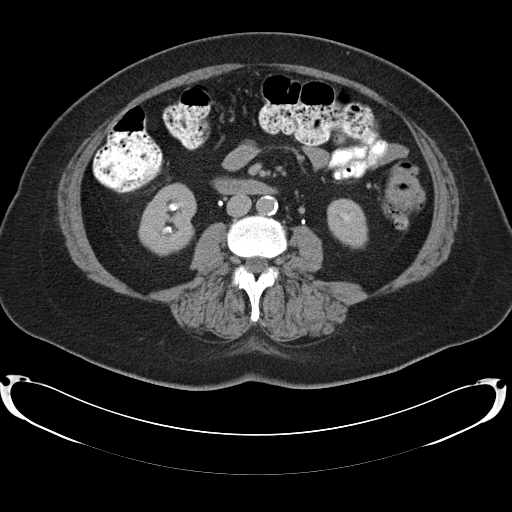
[im 9/30  soft-tissue]
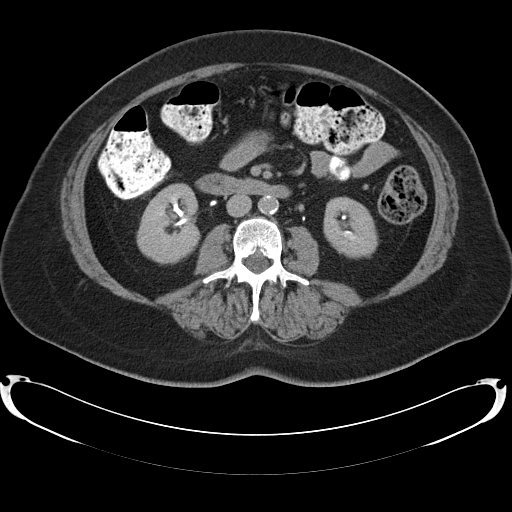
[im 12/30  soft-tissue]
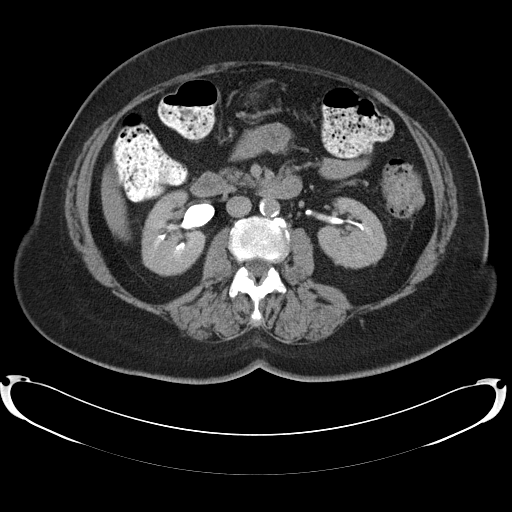
[im 14/30  soft-tissue]
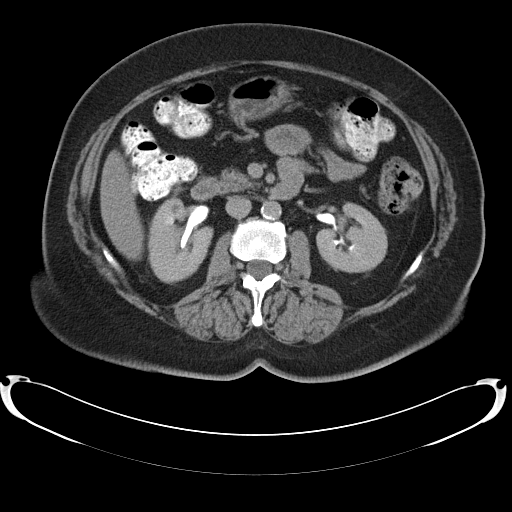
[im 16/30  soft-tissue]
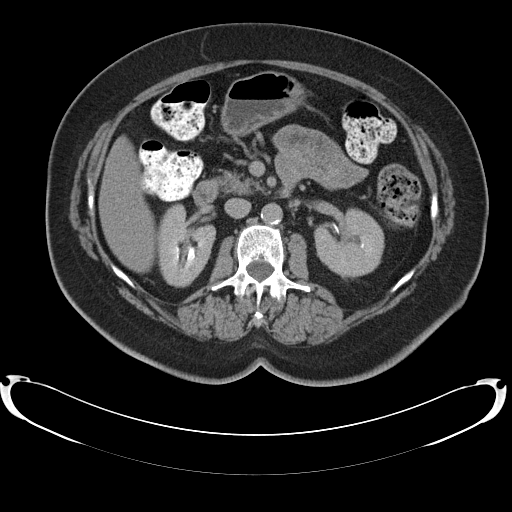
[im 19/30  soft-tissue]
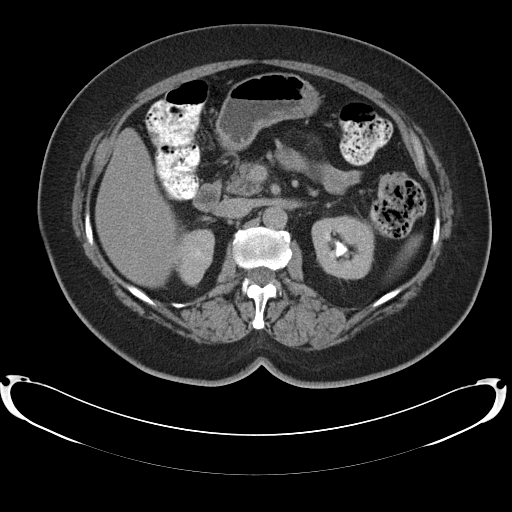
[im 21/30  soft-tissue]
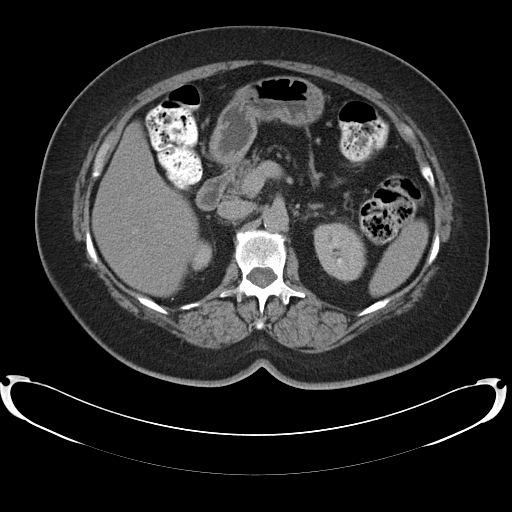
[im 21/30  bone]
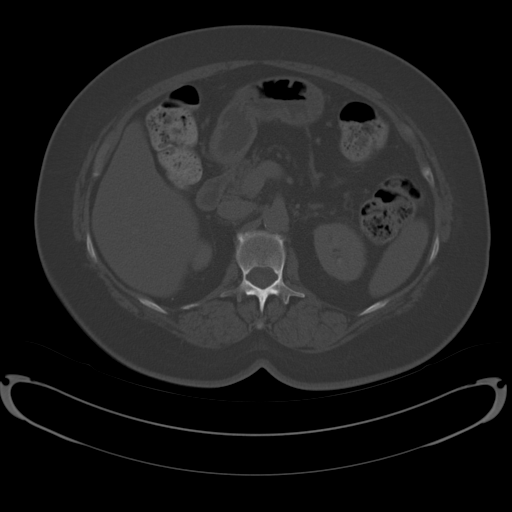
[im 23/30  soft-tissue]
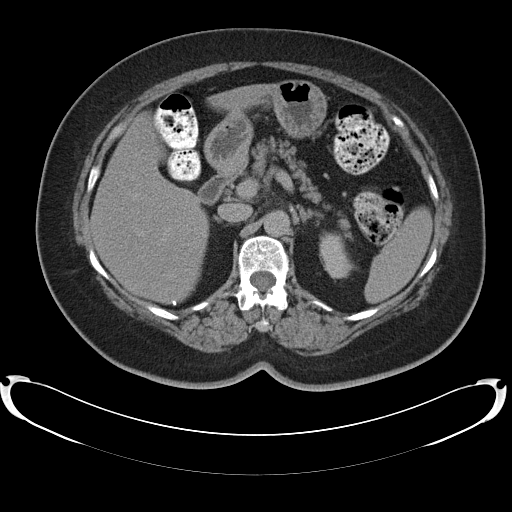
[im 23/30  lung]
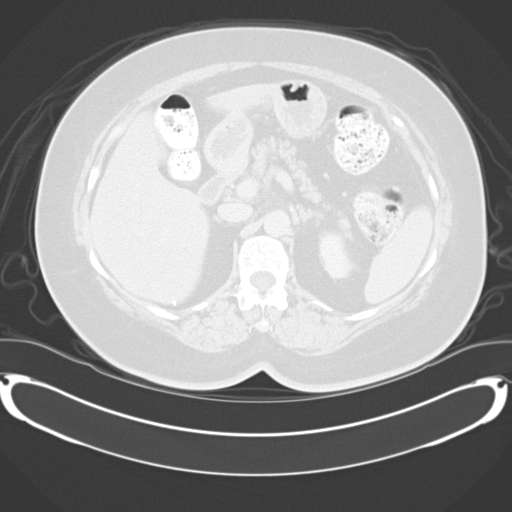
[im 24/30  lung]
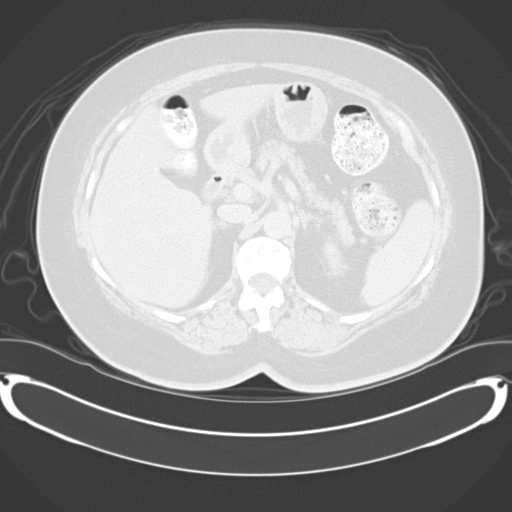
[im 26/30  soft-tissue]
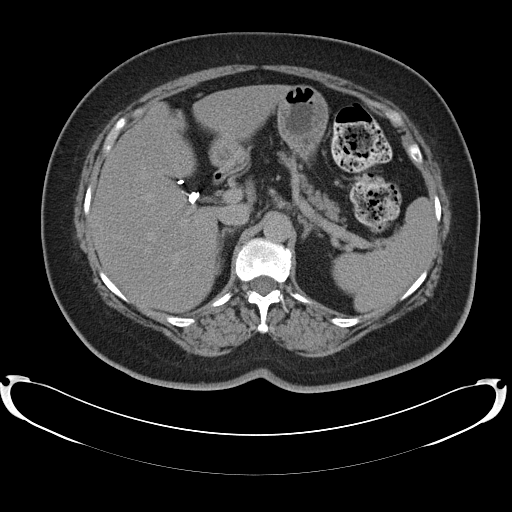
[im 26/30  lung]
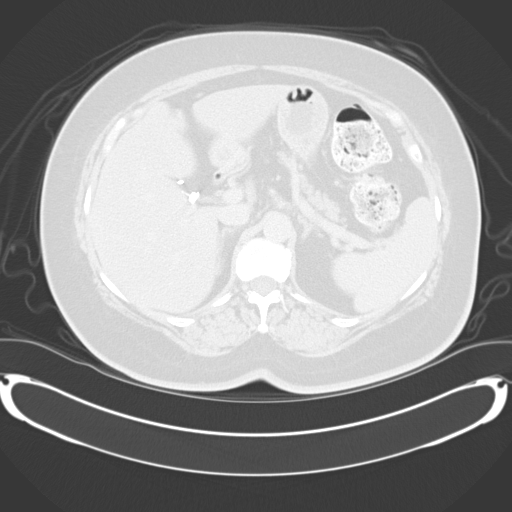
[im 28/30  soft-tissue]
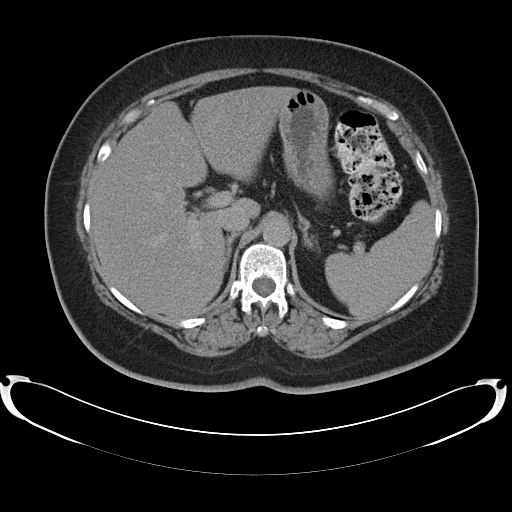
[im 28/30  lung]
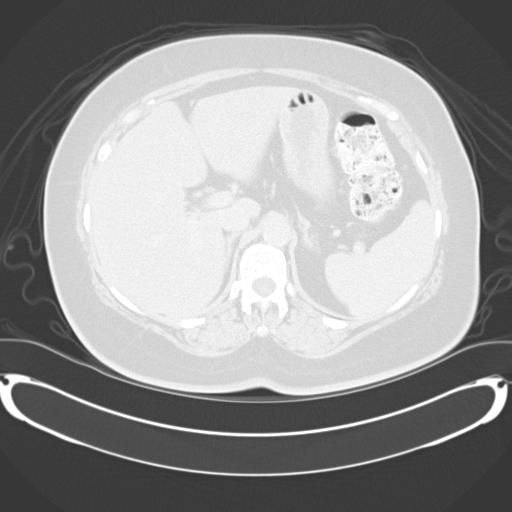

[13 of 32 positions shown; findings below may reference images not displayed]

FINDINGS: There are scattered pulmonary parenchymal changes, the majority of which appear similar to that on the prior examination (see images 6, 13, 14, 17, 21, 36, 39, and 40).  New pulmonary parenchymal changes in the medial posterior lung zones bilaterally are suggestive of atelectatic changes.  New since the prior examination is a 0.7 x 1 x 0.6 cm lesion within the superior aspect of the left lower lung zone (image 26).  Etiology indeterminate.  Malignancy cannot be excluded.  A right hilar 1 cm low density lymph node is unchanged (image 23).  Atherosclerotic type changes with calcification and plaque formation along the aorta and origin of the great vessels, as well as partial calcification of the coronary arteries.  No bony destructive lesion.
IMPRESSION: New since the prior examination is a left upper lobe lesion measuring up to 1 cm.  Etiology is indeterminate.  Malignancy cannot be excluded.  Please see above.
ABDOMEN CT WITH CONTRAST ? [DATE]:
FINDINGS: Minimal nodularity adjacent to the done of the right lobe of liver (image 43) is unchanged.  No focal hepatic, splenic, adrenal, pancreatic, or left renal lesion.  A right renal 0.9 x 0.5 cm low density lesion is too small to adequately characterize but appears relatively similar to the prior examination and may be a small cyst.  Atherosclerotic type changes of the aorta without aneurysmal dilatation.  No retroperitoneal or mesenteric adenopathy.  Prior colostomy in the left lower quadrant.  Small bowel enters through this region but does not appear to be causing obstruction presently.
IMPRESSION: No focal hepatic lesion suspicious for spread of tumor.  No retroperitoneal  adenopathy.
PELVIS CT WITH CONTRAST ? [DATE]:
FINDINGS: Status post colonic resection with soft tissue prominence in the presacral region.  This is similar to the prior examination.  No surrounding adenopathy.  No bony destructive lesion.
IMPRESSION: Presacral soft tissue prominence appears similar to the prior exam.

## 2006-03-12 ENCOUNTER — Ambulatory Visit (HOSPITAL_COMMUNITY): Admission: RE | Admit: 2006-03-12 | Discharge: 2006-03-12 | Payer: Self-pay | Admitting: Hematology & Oncology

## 2006-03-12 IMAGING — CT CT CHEST W/ CM
2 of 5 series · 15 of 36 positions shown, 18 images · IV contrast (omnipaque)
Comparison: [DATE]

CLINICAL DATA: Evaluate left lung nodule.  History of rectal cancer.  
 CHEST CT WITH CONTRAST:
TECHNIQUE: Multidetector CT imaging of the chest was performed following the standard protocol during bolus administration of intravenous contrast.
 Contrast: 80 cc Omnipaque 300

[Series 2: chest routine 5.0 b40f · axial · 0.71mm/px · z∈[-317,-62]mm · 12 of 61 slices shown, 15 images]
[im 5/61  mediastinal]
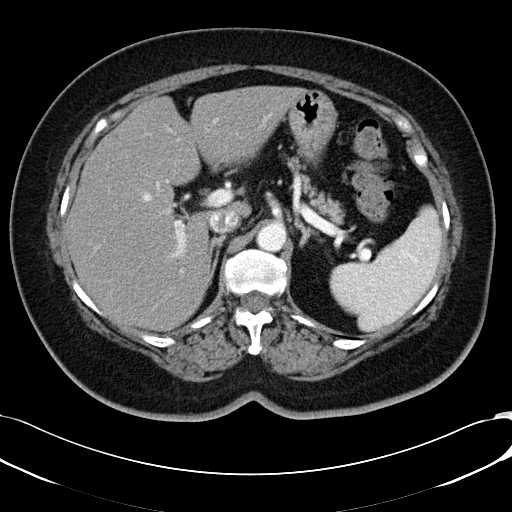
[im 5/61  lung]
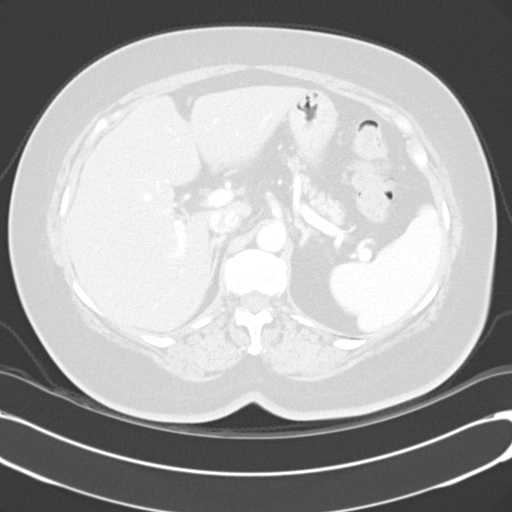
[im 9/61  lung]
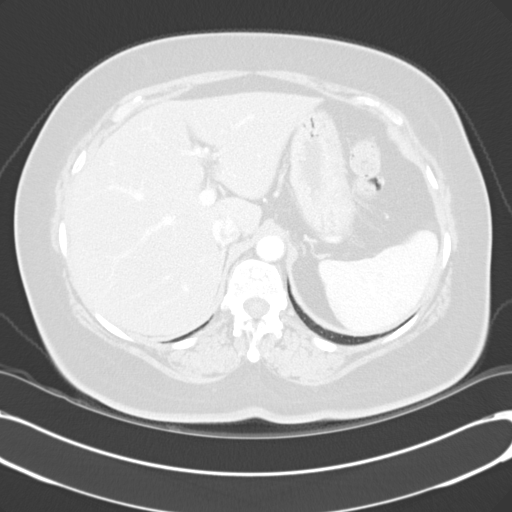
[im 13/61  lung]
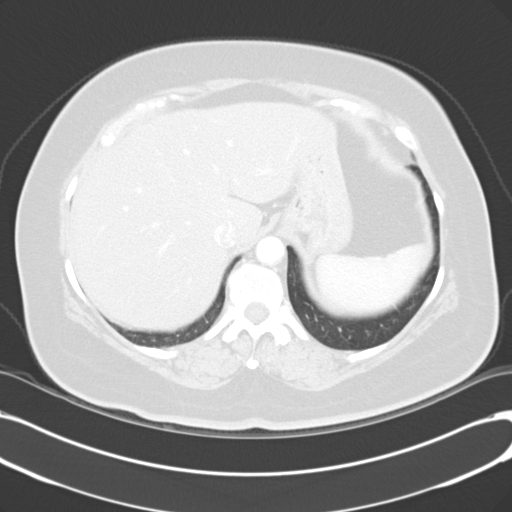
[im 18/61  lung]
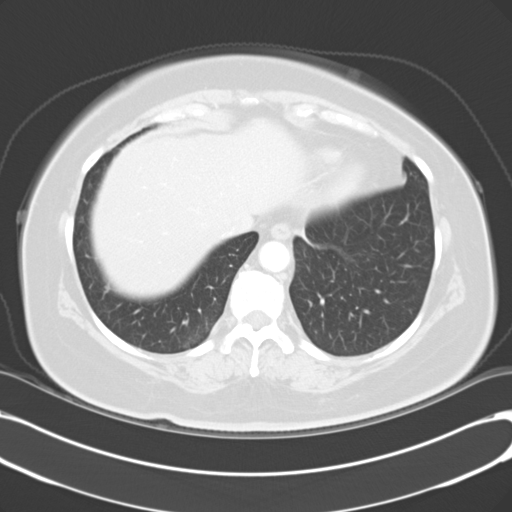
[im 22/61  mediastinal]
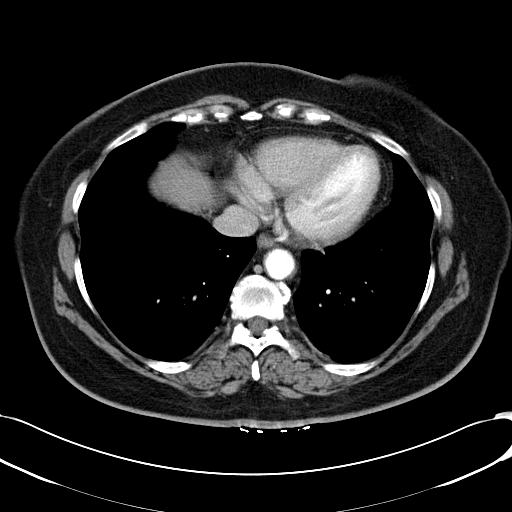
[im 22/61  lung]
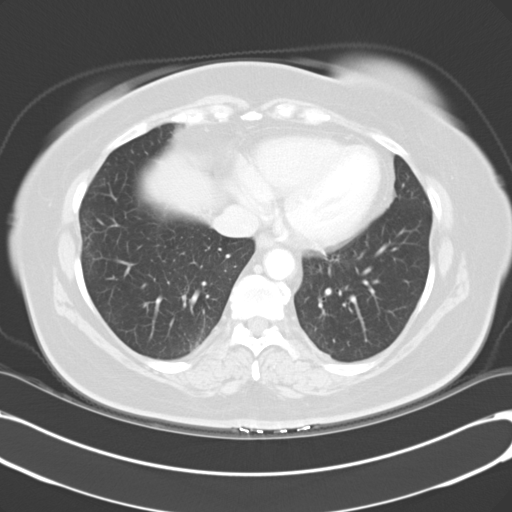
[im 26/61  lung]
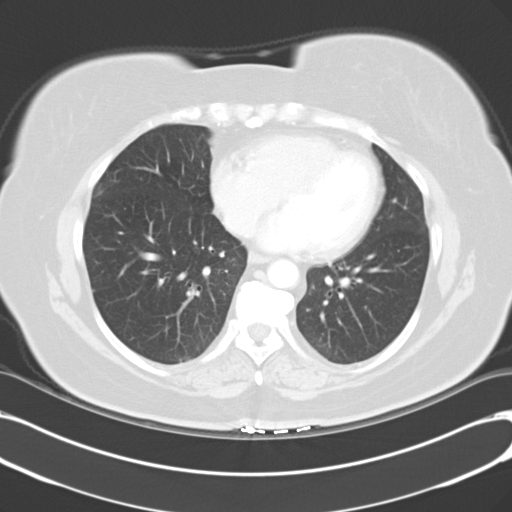
[im 35/61  lung]
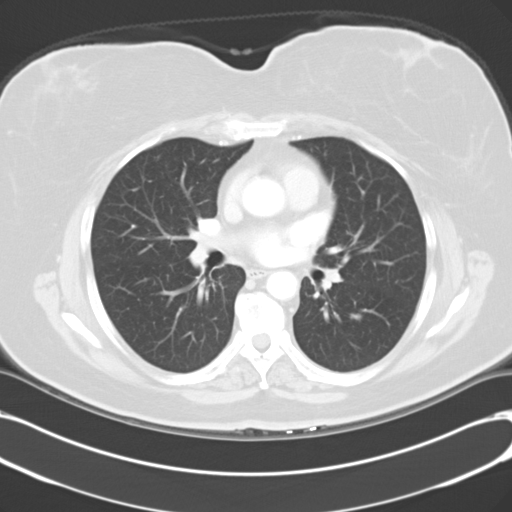
[im 39/61  lung]
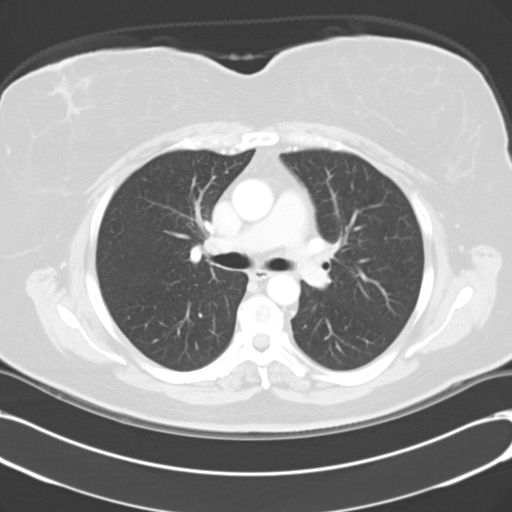
[im 43/61  mediastinal]
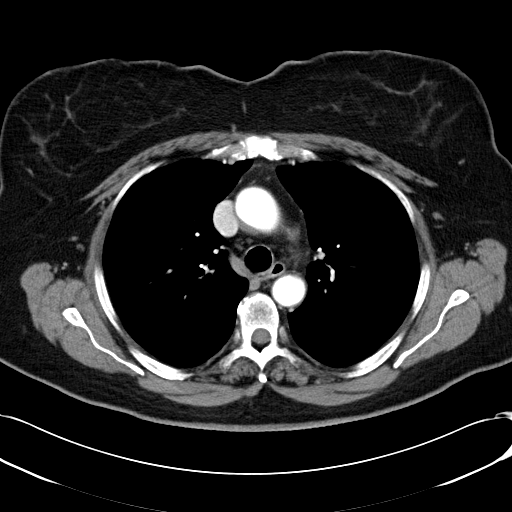
[im 43/61  lung]
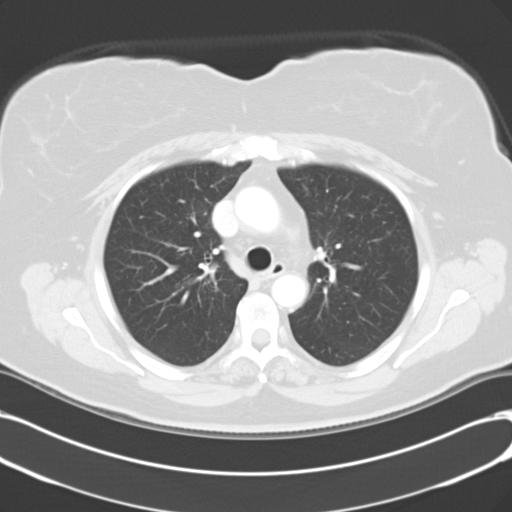
[im 48/61  lung]
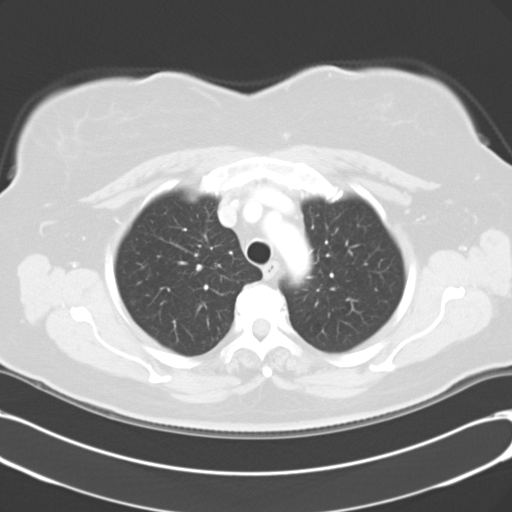
[im 52/61  lung]
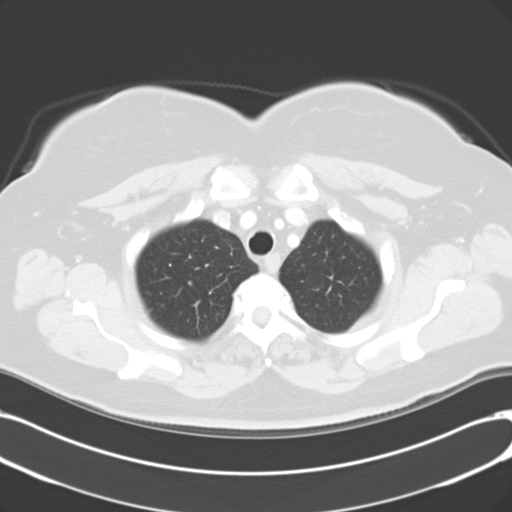
[im 56/61  lung]
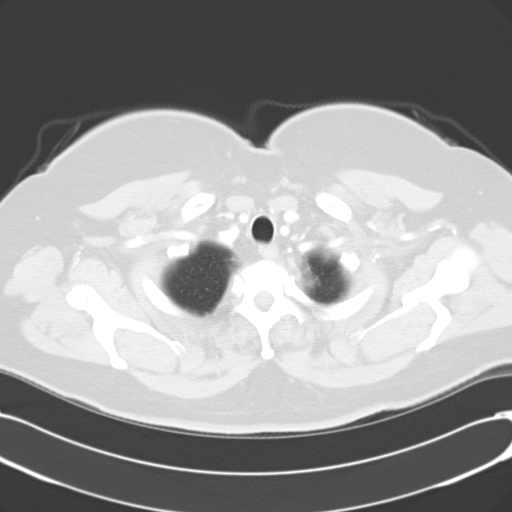

[Series 5: mpr coronal chest · coronal · 0.64mm/px · 3 of 70 slices shown]
[im 14/70  lung]
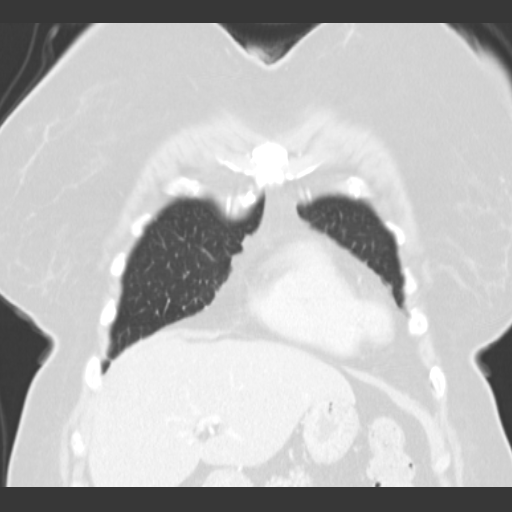
[im 28/70  lung]
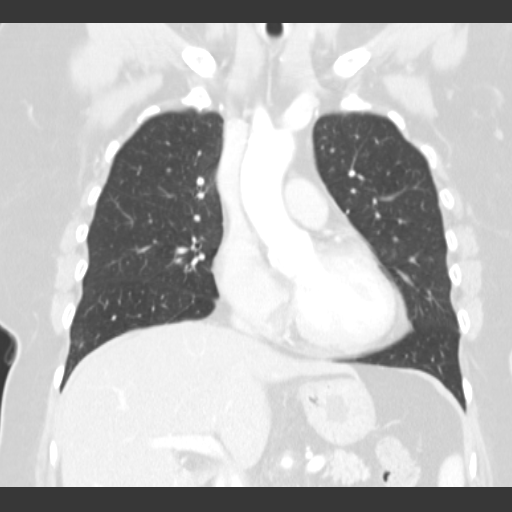
[im 42/70  lung]
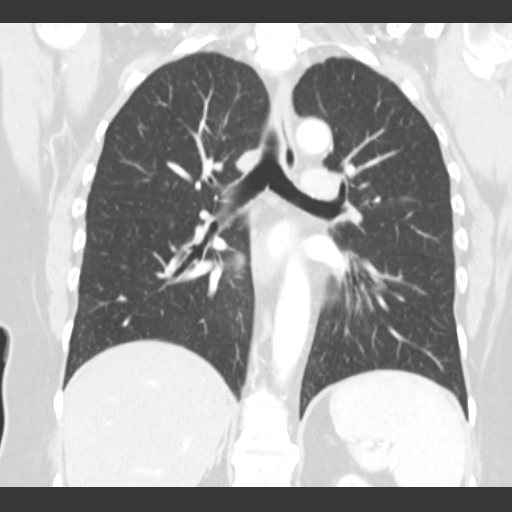

[15 of 36 positions shown; findings below may reference images not displayed]

FINDINGS: No axillary adenopathy.
 No enlarged prevascular right paratracheal, precarinal, subcarinal, or AP window lymph nodes. There is no enlarged hilar lymphadenopathy.
 No pericardial effusion. No pleural effusion.
 The new nodule within the superior segment of the left lower lobe previously described as being in the left upper lobe measures 9.2 x 5.0 cm.  This is unchanged compared to prior exam (image 27 of the current study).  There are scattered parenchymal and subpleural nodules in both lungs.  For example (image #18, right upper lobe, image #33 and #34 right lower lobe left base, and image #46 and #41 and #32).
 Overall, the appearance is stable.  There are no new pulmonary nodules or masses noted. These nodules all remain far too small to be reliably characterized by PET.  Close continued interval follow-up is strongly recommended. 
 Review of the bone windows shows no suspicious lytic or sclerotic lesions.
 There is mild multilevel thoracic spondylosis. 
 The patient is status-post cholecystectomy. 
 The spleen is negative. 
 The left adrenal gland is negative.  The right adrenal gland is negative.
IMPRESSION: 1.  Exam is unchanged compared to prior exam.  The concerning new pulmonary nodule in the superior segment of left lower lobe is stable in size. 
 2.  No significant change in tiny parenchymal and subpleural nodules seen throughout both lungs.  Continued interval follow-up recommended.

## 2006-03-20 ENCOUNTER — Ambulatory Visit: Payer: Self-pay | Admitting: Hematology & Oncology

## 2006-05-10 ENCOUNTER — Ambulatory Visit (HOSPITAL_COMMUNITY): Admission: RE | Admit: 2006-05-10 | Discharge: 2006-05-10 | Payer: Self-pay | Admitting: Hematology & Oncology

## 2006-05-10 IMAGING — CT CT CHEST W/ CM
2 of 4 series · 15 of 36 positions shown, 18 images · IV contrast (omnipaque)
Comparison: [DATE].

CLINICAL DATA: Rectal cancer/follow-up left lower lobe nodule.  
CHEST CT WITH CONTRAST:
TECHNIQUE: Multidetector CT imaging of the chest was performed following the standard protocol during bolus administration of intravenous contrast.
Contrast:  80 cc Omnipaque 300.

[Series 2: chest routine 5.0 b40f · axial · 0.74mm/px · z∈[-442,-147]mm · 12 of 69 slices shown, 15 images]
[im 5/69  mediastinal]
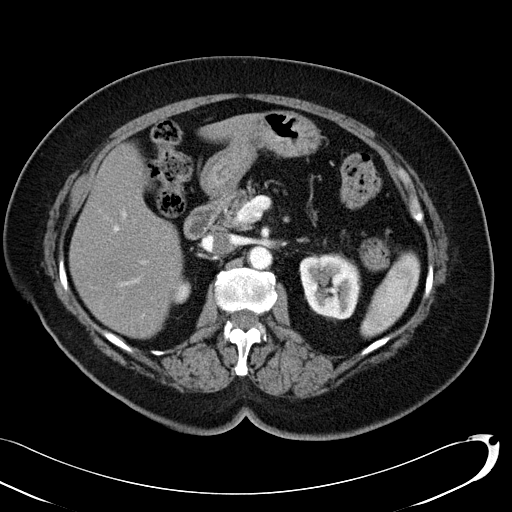
[im 5/69  lung]
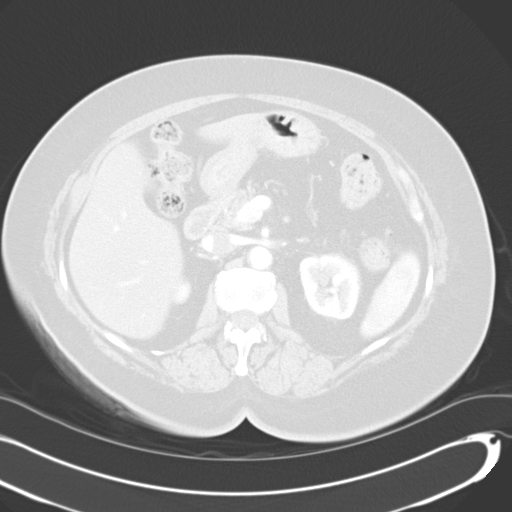
[im 10/69  lung]
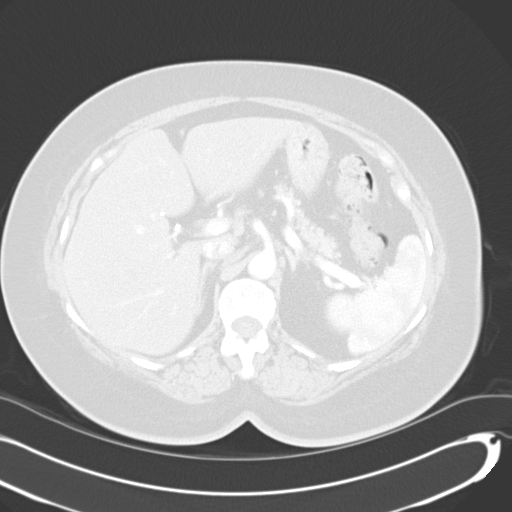
[im 15/69  lung]
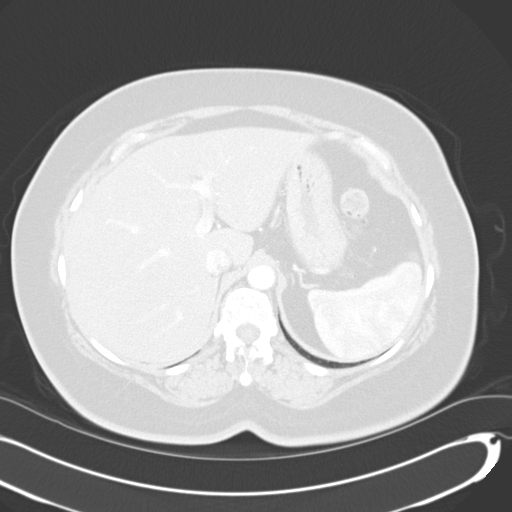
[im 20/69  lung]
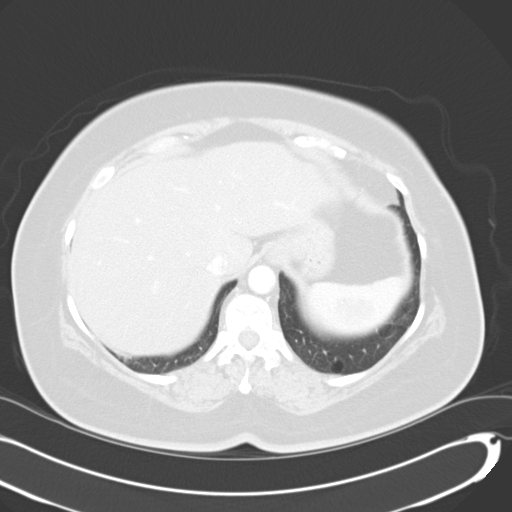
[im 25/69  mediastinal]
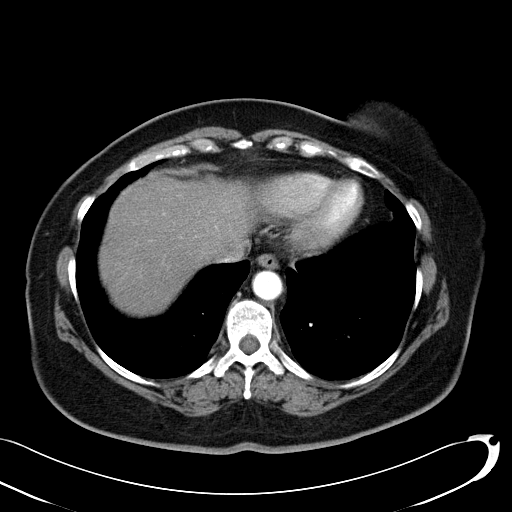
[im 25/69  lung]
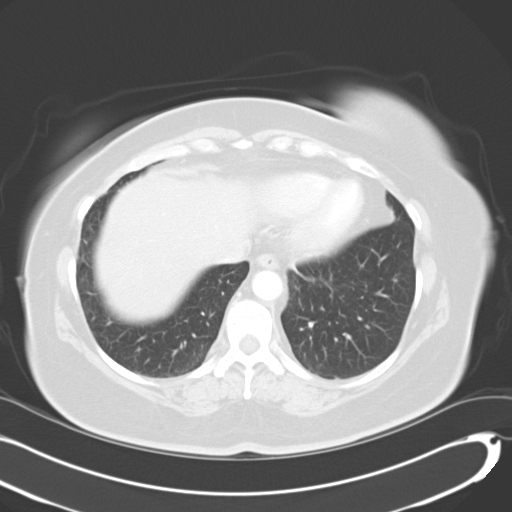
[im 30/69  lung]
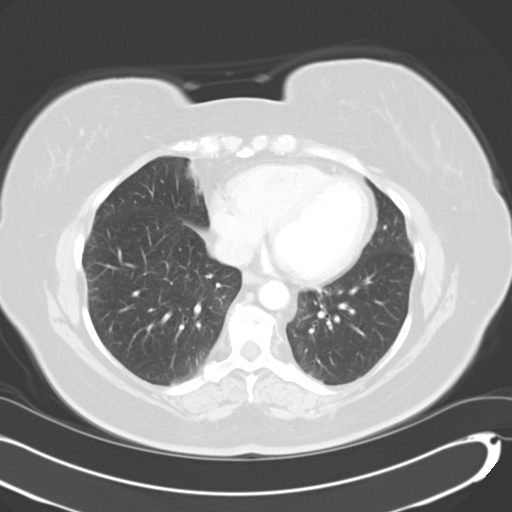
[im 39/69  lung]
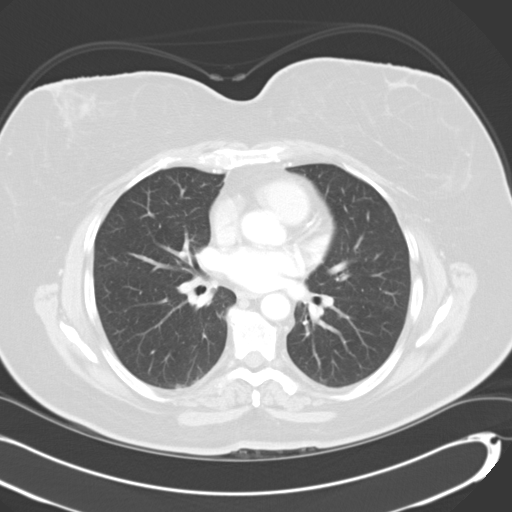
[im 44/69  lung]
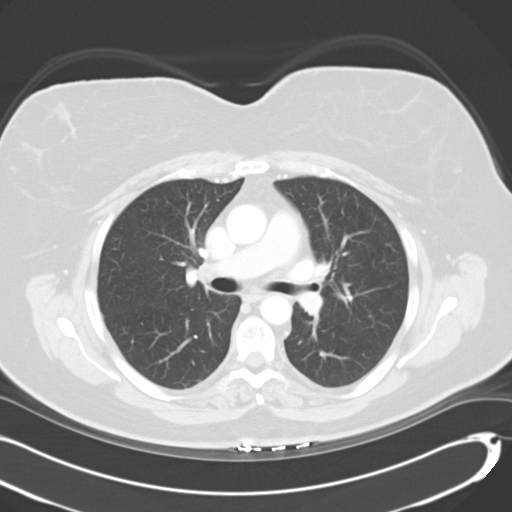
[im 49/69  mediastinal]
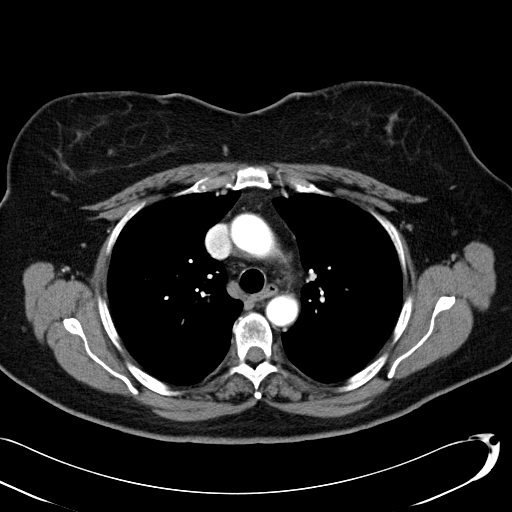
[im 49/69  lung]
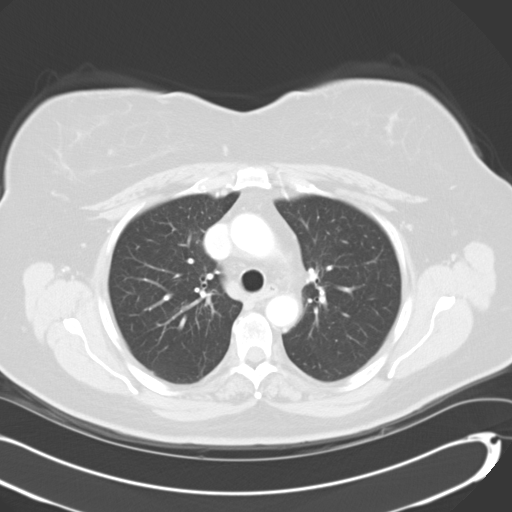
[im 54/69  lung]
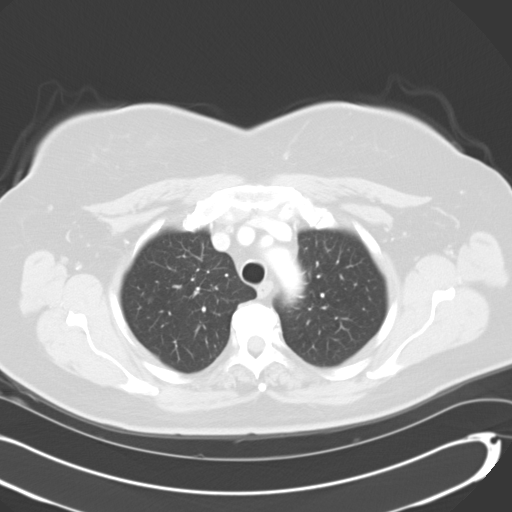
[im 59/69  lung]
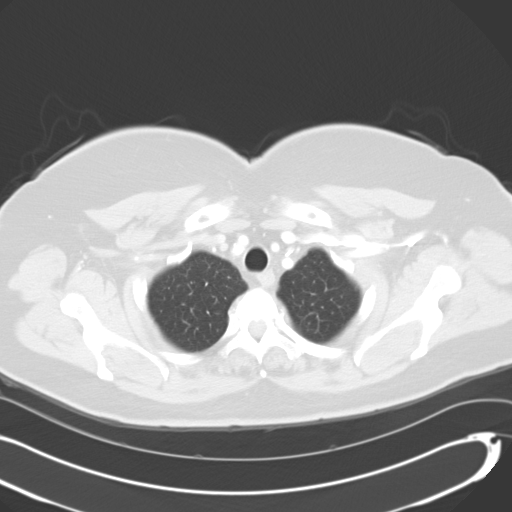
[im 64/69  lung]
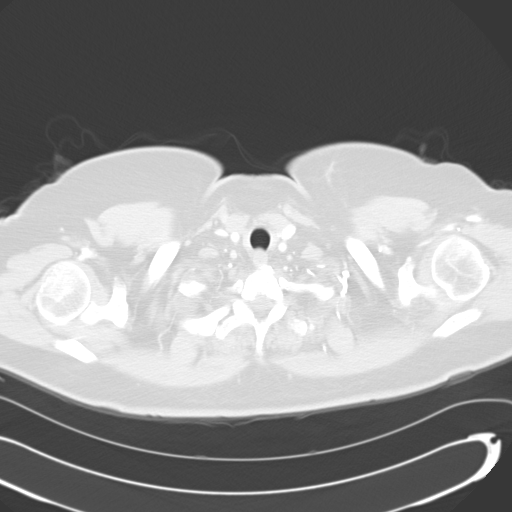

[Series 602: coronal · coronal · 0.74mm/px · 3 of 72 slices shown]
[im 15/72  lung]
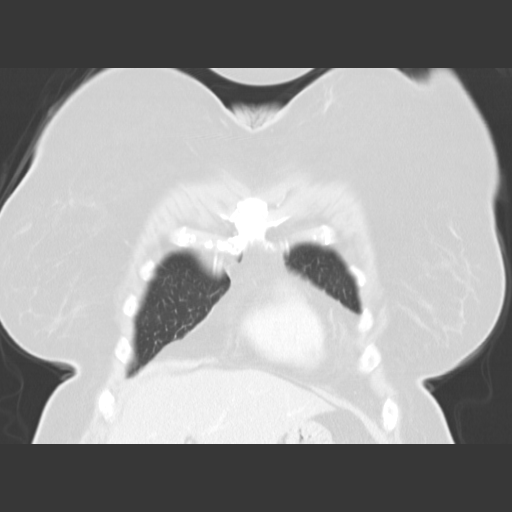
[im 29/72  lung]
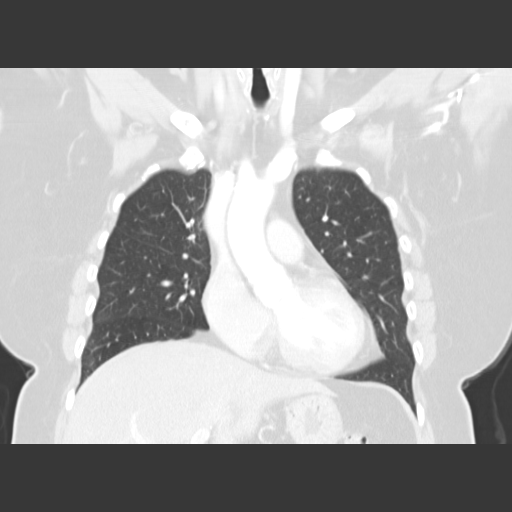
[im 43/72  lung]
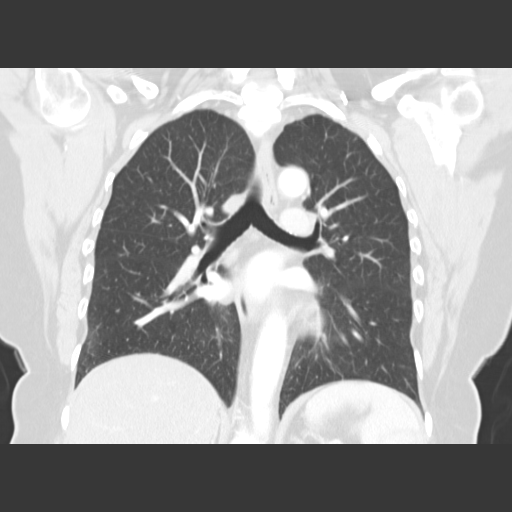

[15 of 36 positions shown; findings below may reference images not displayed]

FINDINGS: No mediastinal or hilar adenopathy.  No axillary nodes or chest wall masses.  No pleural or pericardial fluid.  Upper abdominal structures included on the lowest cuts are unremarkable.  
The nodule in the superior segment of the left lower lobe is difficult to see on today?s exam.  On image #29, it is a tubular structure measuring approximately 8 x 2.5 cm.  It looks like a blood vessel in the axial plane but does not communicate with surrounding blood vessels.  Either the nodule is smaller, or level of the scan slice is slightly different by a few millimeters, making it look smaller.  It certainly appears to be no larger. 
Scattered peripheral subpleural nodules or scar-like densities are stable.
IMPRESSION: 1.  The nodular density in the superior segment of the left lower lobe appears smaller.  This may be an artifact of volume averaging, but there  appears to be no evidence that this nodule has enlarged.  
2.  The other minimal subpleural densities are stable.
3.  No new findings.

## 2006-05-14 ENCOUNTER — Ambulatory Visit: Payer: Self-pay | Admitting: Hematology & Oncology

## 2006-05-17 LAB — CBC WITH DIFFERENTIAL/PLATELET
BASO%: 0.3 % (ref 0.0–2.0)
Basophils Absolute: 0 10*3/uL (ref 0.0–0.1)
EOS%: 4.2 % (ref 0.0–7.0)
HGB: 12.2 g/dL (ref 11.6–15.9)
MCH: 28.2 pg (ref 26.0–34.0)
MCHC: 33.6 g/dL (ref 32.0–36.0)
MCV: 84 fL (ref 81.0–101.0)
MONO%: 6.5 % (ref 0.0–13.0)
RBC: 4.33 10*6/uL (ref 3.70–5.32)
RDW: 14.2 % (ref 11.3–14.5)
lymph#: 1.2 10*3/uL (ref 0.9–3.3)

## 2006-05-17 LAB — COMPREHENSIVE METABOLIC PANEL
ALT: 21 U/L (ref 0–35)
AST: 16 U/L (ref 0–37)
Albumin: 4.6 g/dL (ref 3.5–5.2)
Alkaline Phosphatase: 101 U/L (ref 39–117)
Calcium: 9 mg/dL (ref 8.4–10.5)
Chloride: 103 mEq/L (ref 96–112)
Potassium: 4.2 mEq/L (ref 3.5–5.3)
Sodium: 141 mEq/L (ref 135–145)
Total Protein: 7.2 g/dL (ref 6.0–8.3)

## 2006-08-23 ENCOUNTER — Ambulatory Visit: Payer: Self-pay | Admitting: Hematology & Oncology

## 2006-08-28 LAB — CREATININE, SERUM: Creatinine, Ser: 0.78 mg/dL (ref 0.40–1.20)

## 2006-08-30 ENCOUNTER — Ambulatory Visit (HOSPITAL_COMMUNITY): Admission: RE | Admit: 2006-08-30 | Discharge: 2006-08-30 | Payer: Self-pay | Admitting: Hematology & Oncology

## 2006-08-30 IMAGING — CT CT PELVIS W/ CM
1 of 3 series · 13 of 32 positions shown, 18 images · IV contrast (omnipaque)
Comparison: [DATE] and [DATE]

CHEST CT WITH CONTRAST

CLINICAL DATA: Rectal cancer
TECHNIQUE: Multidetector CT imaging of the chest, abdomen, and pelvis was
performed following the standard protocol during bolus administration of
intravenous contrast.

Contrast:  125 cc Omnipaque 300

[Series 2: cap 5.0 b40f st · axial · 0.75mm/px · z∈[-616,-61]mm · 13 of 125 slices shown, 18 images]
[im 7/125  soft-tissue]
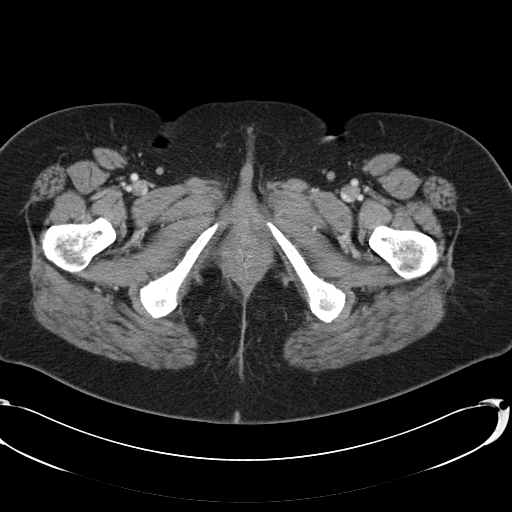
[im 7/125  bone]
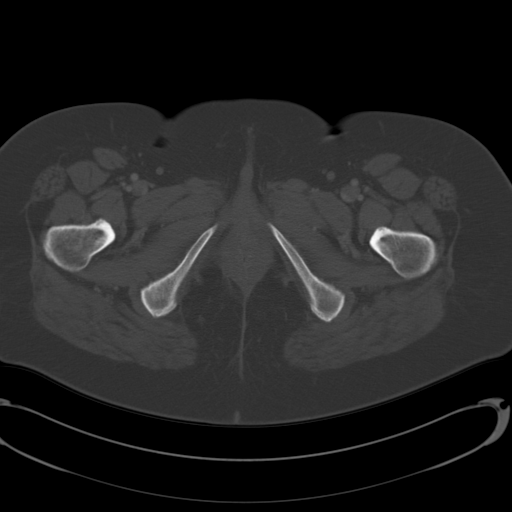
[im 21/125  soft-tissue]
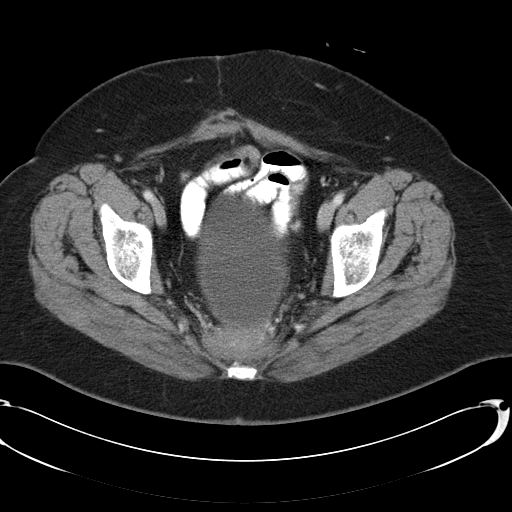
[im 28/125  soft-tissue]
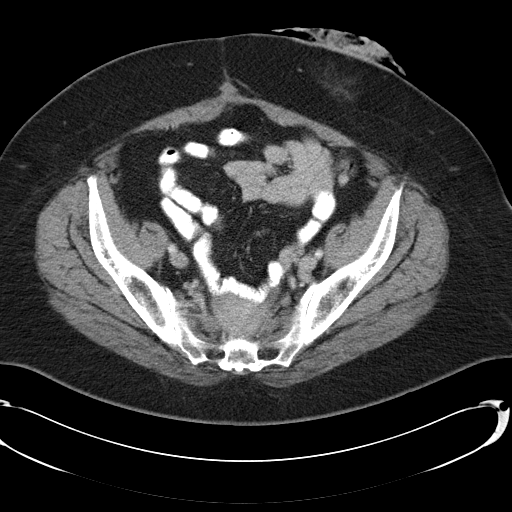
[im 35/125  soft-tissue]
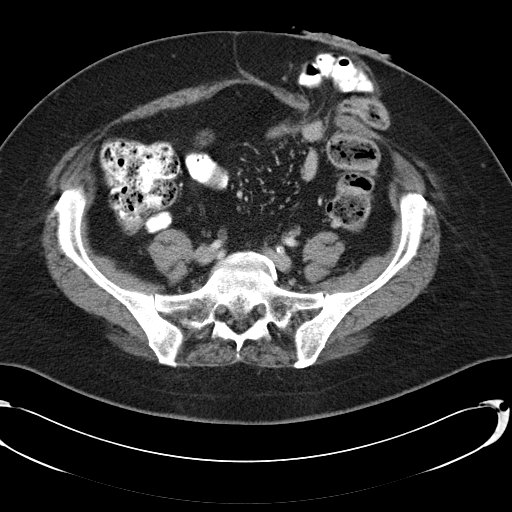
[im 49/125  soft-tissue]
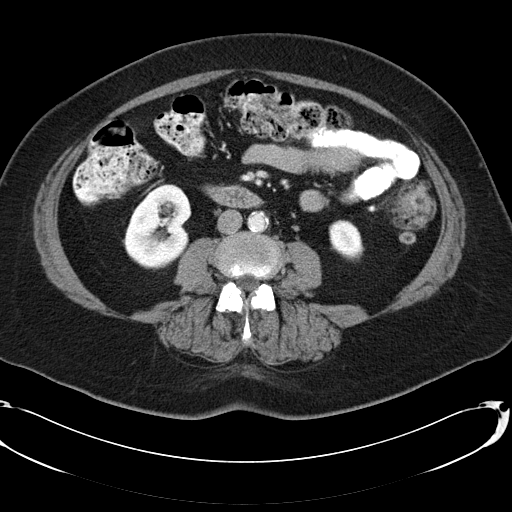
[im 56/125  soft-tissue]
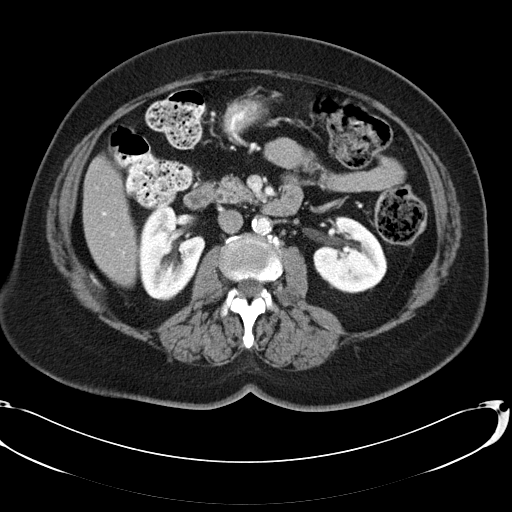
[im 69/125  soft-tissue]
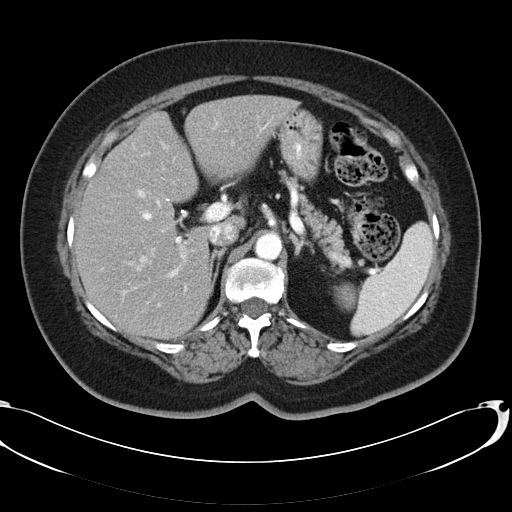
[im 76/125  soft-tissue]
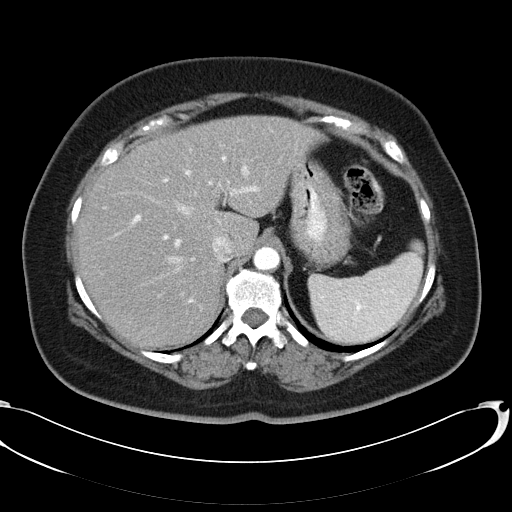
[im 90/125  soft-tissue]
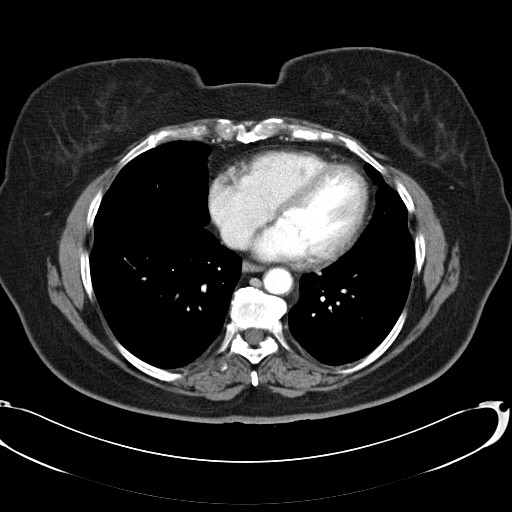
[im 90/125  bone]
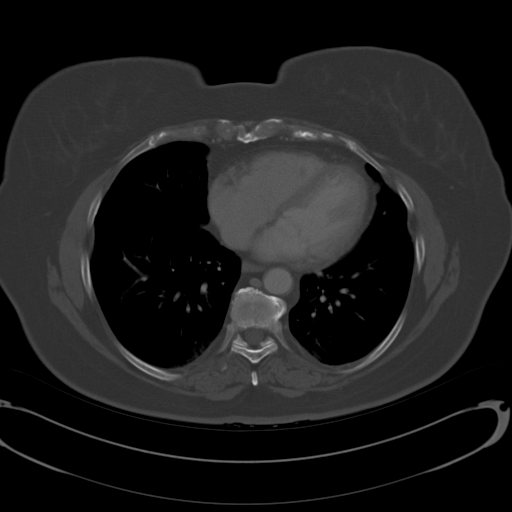
[im 97/125  soft-tissue]
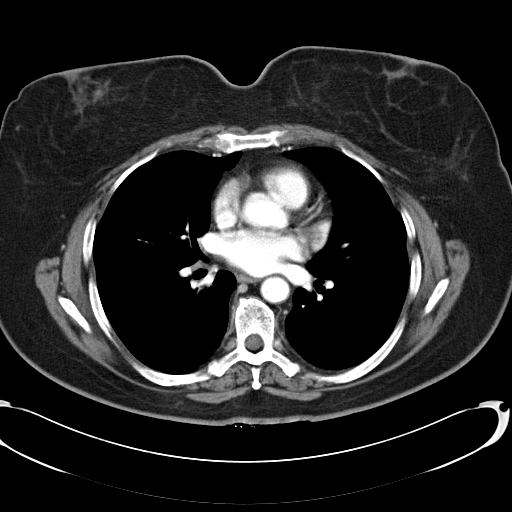
[im 97/125  lung]
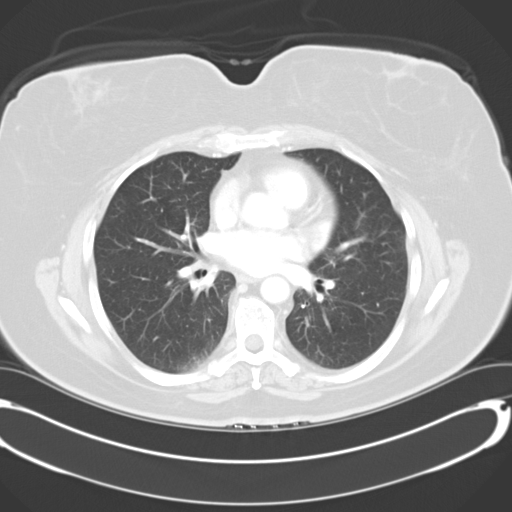
[im 104/125  soft-tissue]
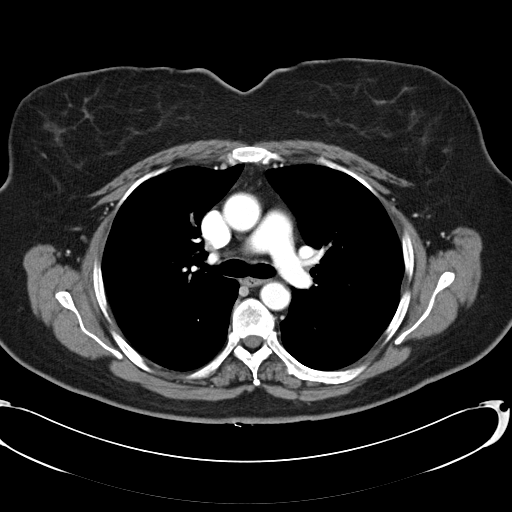
[im 104/125  lung]
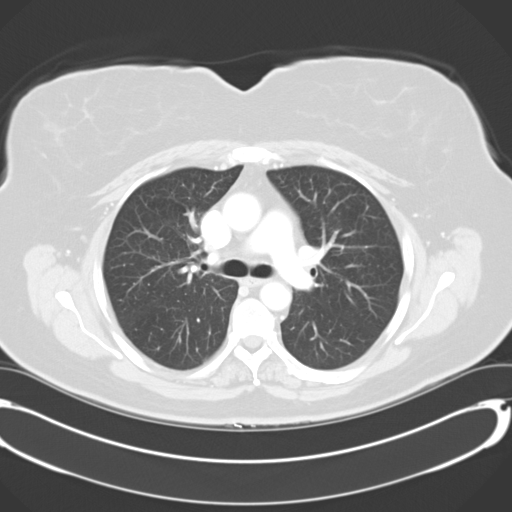
[im 111/125  lung]
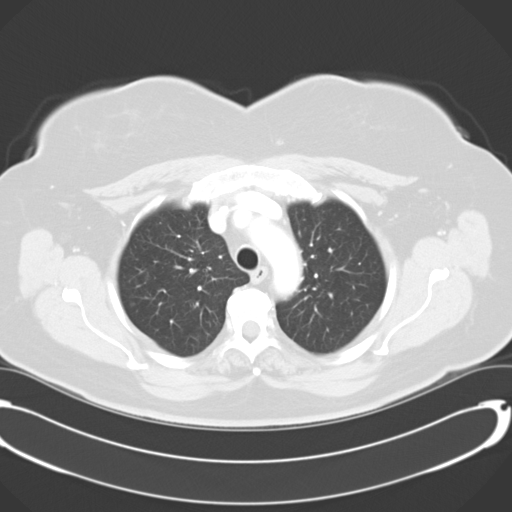
[im 118/125  soft-tissue]
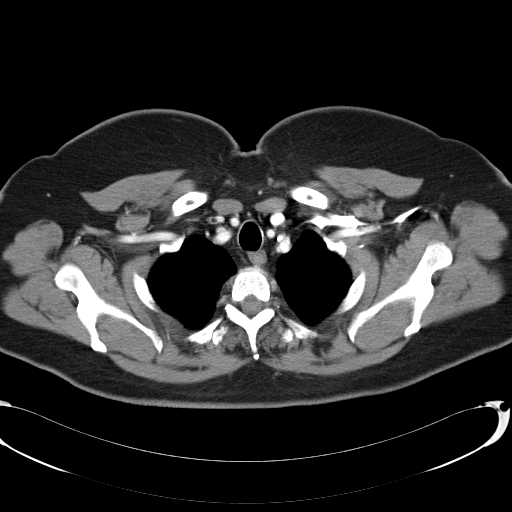
[im 118/125  lung]
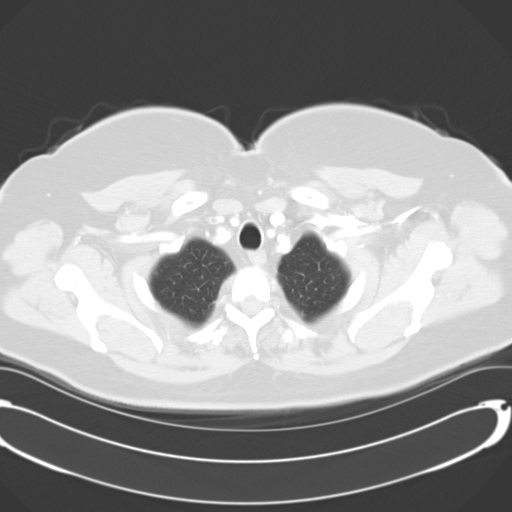

[13 of 32 positions shown; findings below may reference images not displayed]

FINDINGS: Negative for lymphadenopathy.

Negative for pericardial or pleural effusion.

A negative for lytic or sclerotic lesions.

Tiny right lower lobe nodule measures 3 mm comment 32. This is unchanged
compared to prior exam.

Subpleural nodule in the left lower lobe measures 3 mm, image 39. This is
unchanged compared to prior exam. 

Previously referenced lesion within the superior segment of the left lower lobe
is not seen on today's exam. Differences may reflect slice selection and/or
technique.

IMPRESSION

1. Stable CT chest.
2. No specific evidence for metastasis.
3. Tiny bilateral nodules unchanged 

ABDOMEN CT WITH CONTRAST
FINDINGS: Liver is normal in attenuation and morphology.

Spleen is negative.

The adrenal glands are negative.

The pancreas is negative.

The right kidney is normal.

The left kidney is normal.

No pathologically enlarged retrocrural, retroperitoneal, or small bowel
mesenteric lymphadenopathy.

The visualized bowel loops are normal in course and caliber. 

There is a left lower quadrant colostomy which contains herniated loops of small
bowel, image 92. There is no evidence for secondary obstruction. 

Review of the bone windows show mild lumbar spondylosis.

IMPRESSION

1. No specific evidence for metastasis.
2. Left lower quadrant colostomy contains herniated loops of small bowel which
are nonobstructive.

PELVIS CT WITH CONTRAST
FINDINGS: Urinary bladder is negative.

No pelvic or inguinal lymphadenopathy.

Urinary bladder is negative.

Patient is status post colonic resection with soft tissue prominence in the
presacral region. This is similar to prior exam. No surrounding adenopathy. No
bony destructive lesion.

IMPRESSION

1. Presacral soft tissue prominence appears similar to prior exam.

## 2006-09-13 LAB — COMPREHENSIVE METABOLIC PANEL
Albumin: 4.3 g/dL (ref 3.5–5.2)
BUN: 16 mg/dL (ref 6–23)
Calcium: 9.1 mg/dL (ref 8.4–10.5)
Chloride: 100 mEq/L (ref 96–112)
Creatinine, Ser: 0.81 mg/dL (ref 0.40–1.20)
Glucose, Bld: 230 mg/dL — ABNORMAL HIGH (ref 70–99)
Potassium: 4.3 mEq/L (ref 3.5–5.3)

## 2006-09-13 LAB — CBC WITH DIFFERENTIAL/PLATELET
BASO%: 0.5 % (ref 0.0–2.0)
Basophils Absolute: 0 10*3/uL (ref 0.0–0.1)
EOS%: 5.4 % (ref 0.0–7.0)
HCT: 35.2 % (ref 34.8–46.6)
HGB: 12.2 g/dL (ref 11.6–15.9)
LYMPH%: 20.4 % (ref 14.0–48.0)
MCH: 28.3 pg (ref 26.0–34.0)
MCHC: 34.7 g/dL (ref 32.0–36.0)
NEUT%: 66.3 % (ref 39.6–76.8)
Platelets: 273 10*3/uL (ref 145–400)
lymph#: 1.1 10*3/uL (ref 0.9–3.3)

## 2006-09-13 LAB — CEA: CEA: 1.3 ng/mL (ref 0.0–5.0)

## 2007-01-03 ENCOUNTER — Ambulatory Visit: Payer: Self-pay | Admitting: Hematology & Oncology

## 2007-01-08 LAB — CBC WITH DIFFERENTIAL/PLATELET
Eosinophils Absolute: 0.3 10*3/uL (ref 0.0–0.5)
HGB: 12.7 g/dL (ref 11.6–15.9)
MONO#: 0.5 10*3/uL (ref 0.1–0.9)
MONO%: 7.3 % (ref 0.0–13.0)
NEUT#: 4.5 10*3/uL (ref 1.5–6.5)
RBC: 4.49 10*6/uL (ref 3.70–5.32)
RDW: 14.1 % (ref 11.3–14.5)
WBC: 6.8 10*3/uL (ref 3.9–10.0)
lymph#: 1.5 10*3/uL (ref 0.9–3.3)

## 2007-01-08 LAB — COMPREHENSIVE METABOLIC PANEL
Albumin: 4.5 g/dL (ref 3.5–5.2)
Alkaline Phosphatase: 100 U/L (ref 39–117)
CO2: 24 mEq/L (ref 19–32)
Calcium: 9.4 mg/dL (ref 8.4–10.5)
Chloride: 98 mEq/L (ref 96–112)
Glucose, Bld: 178 mg/dL — ABNORMAL HIGH (ref 70–99)
Potassium: 4.4 mEq/L (ref 3.5–5.3)
Sodium: 136 mEq/L (ref 135–145)
Total Protein: 7.3 g/dL (ref 6.0–8.3)

## 2007-01-10 ENCOUNTER — Ambulatory Visit (HOSPITAL_COMMUNITY): Admission: RE | Admit: 2007-01-10 | Discharge: 2007-01-10 | Payer: Self-pay | Admitting: Hematology & Oncology

## 2007-01-10 IMAGING — CT CT CHEST W/ CM
2 of 6 series · 16 of 46 positions shown, 18 images · IV contrast (omnipaque)
Comparison: CT?s of the chest, abdomen and pelvis done [DATE] and [DATE].

CLINICAL DATA: Recurrent rectal carcinoma.  Post radiation chemotherapy.  Follow-up pulmonary nodule.
CHEST CT WITH CONTRAST:
TECHNIQUE: Multidetector CT imaging of the chest was performed following the standard protocol during bolus administration of intravenous contrast.
Contrast:  100 cc Omnipaque 300.  Oral contrast was given.
TECHNIQUE: Multidetector CT imaging of the abdomen was performed following the standard protocol during bolus administration of intravenous contrast.
TECHNIQUE: Multidetector CT imaging of the pelvis was performed following the standard protocol during bolus administration of intravenous contrast.

[Series 2: cap 5.0 b40f · axial · 0.73mm/px · z∈[-618,-82]mm · 13 of 125 slices shown, 15 images]
[im 9/125  soft-tissue]
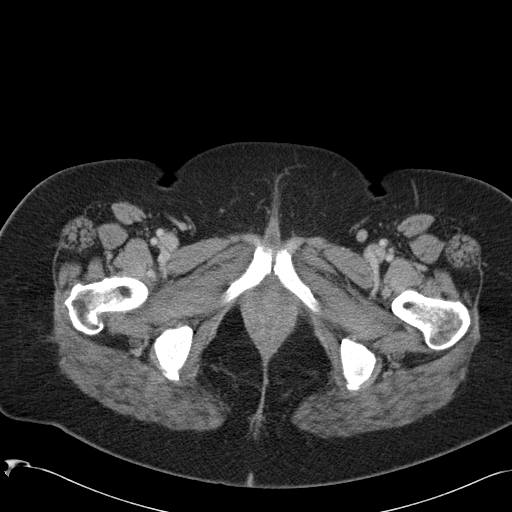
[im 9/125  bone]
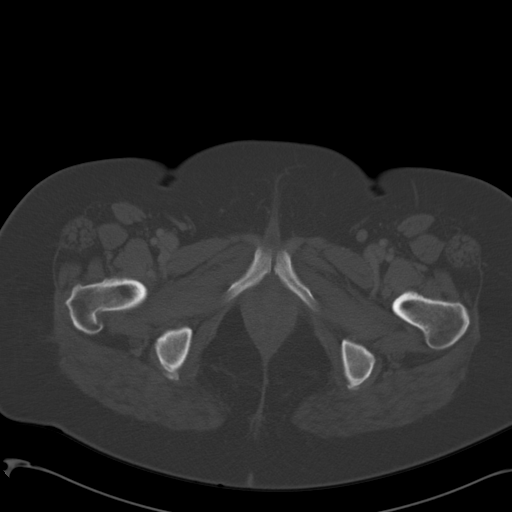
[im 17/125  soft-tissue]
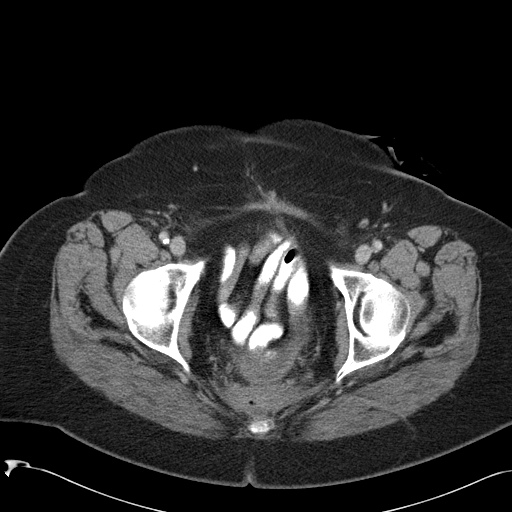
[im 25/125  soft-tissue]
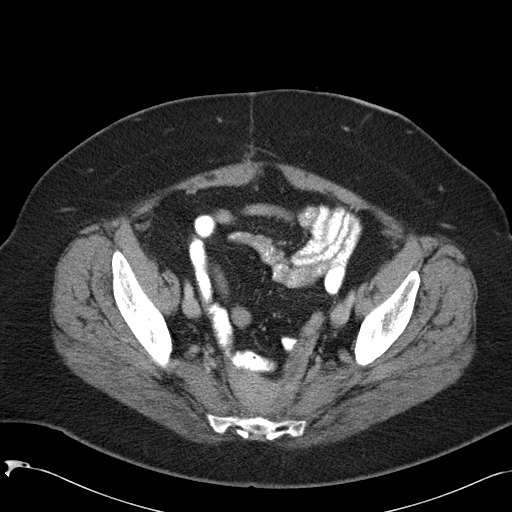
[im 34/125  soft-tissue]
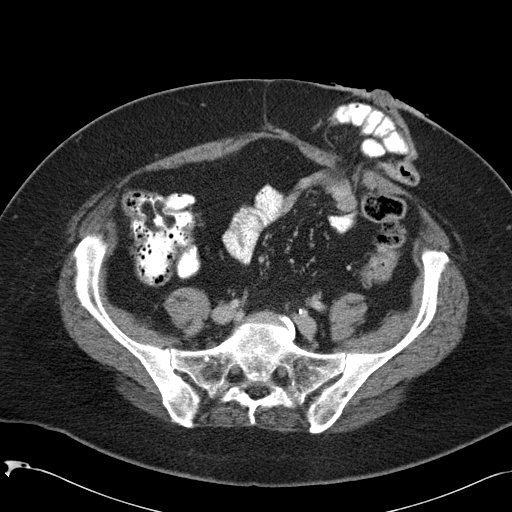
[im 42/125  soft-tissue]
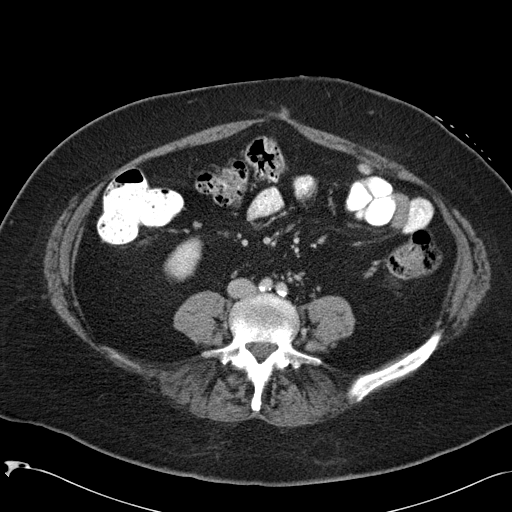
[im 50/125  soft-tissue]
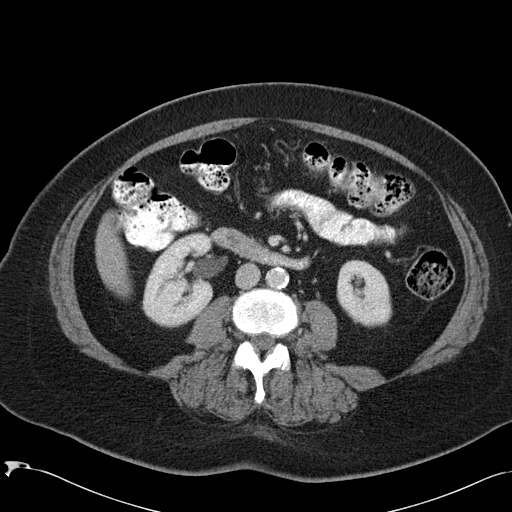
[im 67/125  soft-tissue]
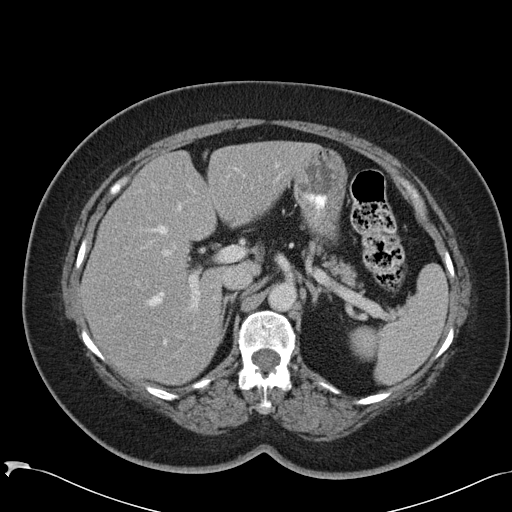
[im 75/125  soft-tissue]
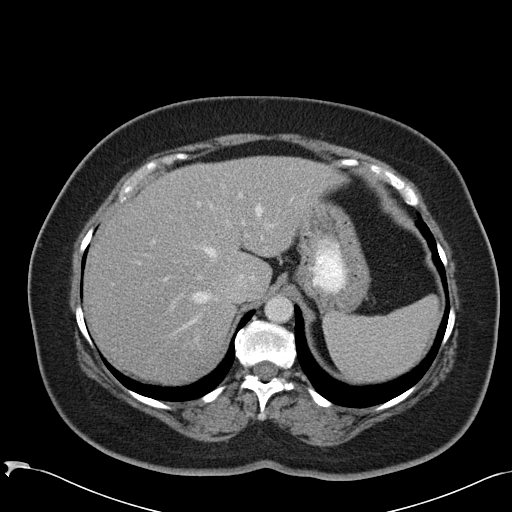
[im 83/125  soft-tissue]
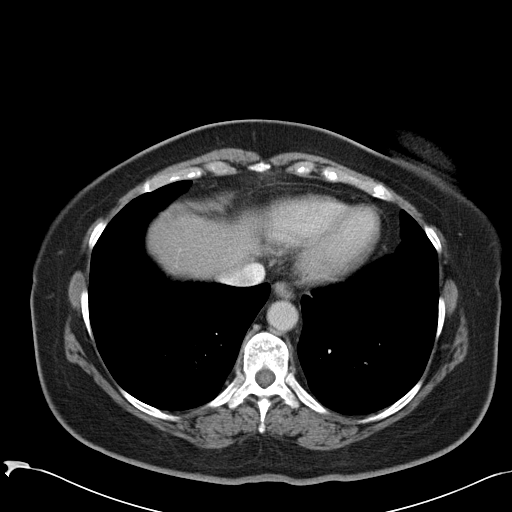
[im 83/125  bone]
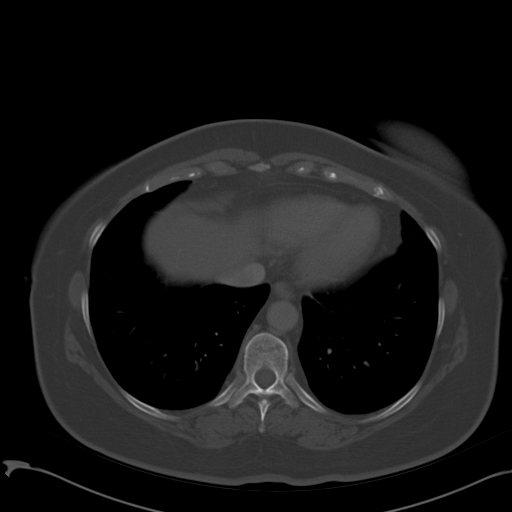
[im 91/125  soft-tissue]
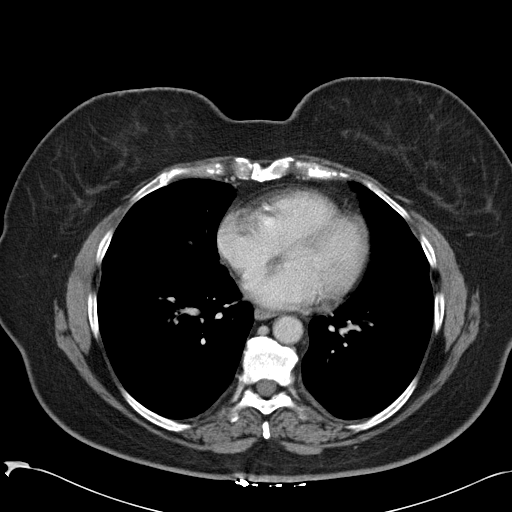
[im 100/125  soft-tissue]
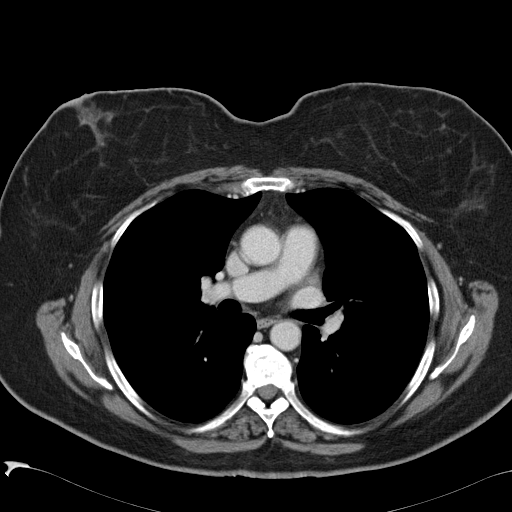
[im 108/125  soft-tissue]
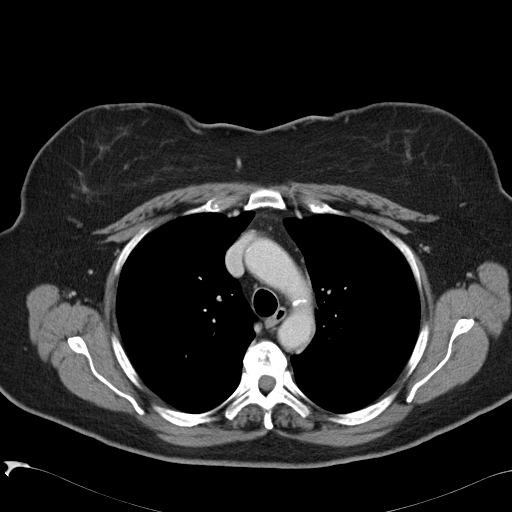
[im 116/125  soft-tissue]
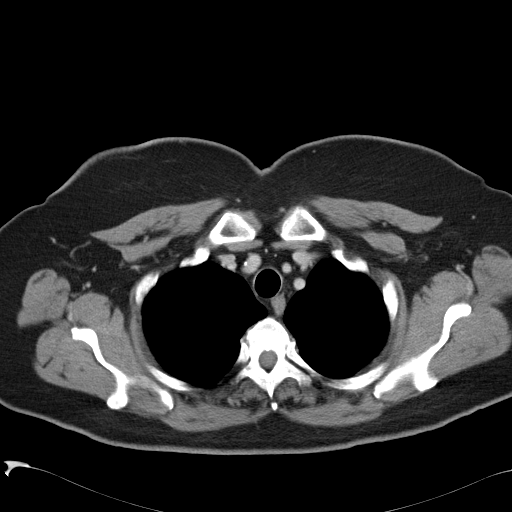

[Series 602: <mpr thick range> · coronal · 1.22mm/px · 3 of 94 slices shown]
[im 32/94  soft-tissue]
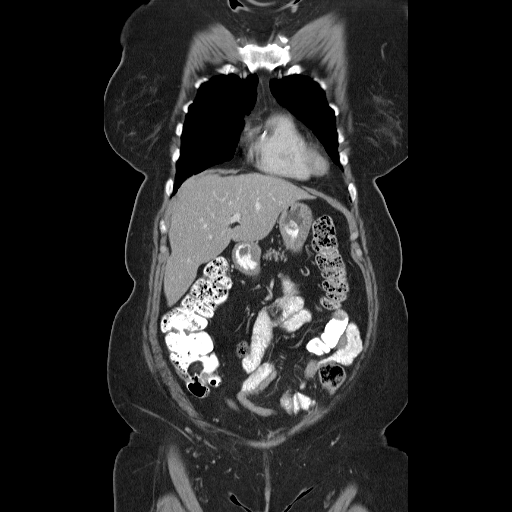
[im 42/94  soft-tissue]
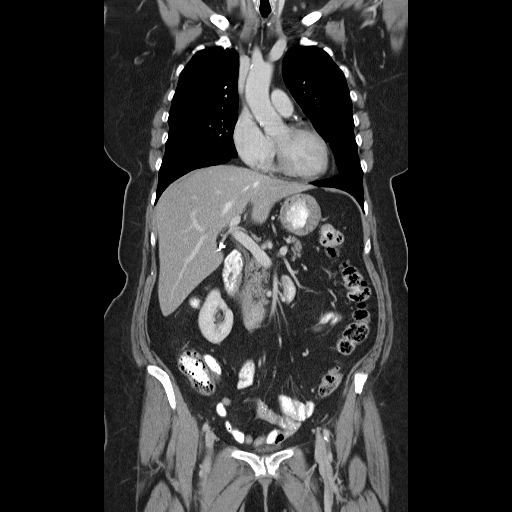
[im 52/94  soft-tissue]
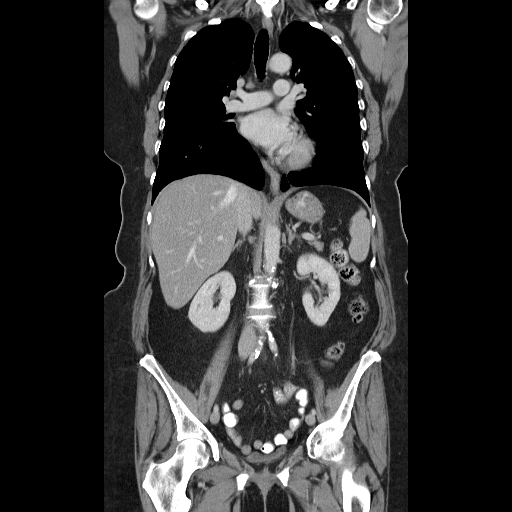

[16 of 46 positions shown; findings below may reference images not displayed]

FINDINGS: No enlarged mediastinal or hilar lymph nodes are present.  There is no pleural or pericardial effusion.  The mediastinum has a stable appearance.  There are faint coronary artery calcifications.
Scattered subpleural tiny pulmonary nodules are unchanged from the prior examination.  These are noted in the right lower lobe on image 34 and in the left lower lobe on images 34, 42, and 46.  There are no suspicious pulmonary nodules or osseous lesions.
IMPRESSION: Stable examination with scattered subpleural nodularity, likely post inflammatory. No new or suspicious findings.
ABDOMEN CT WITH CONTRAST:
FINDINGS: The liver has a stable appearance without focal abnormality.  The gallbladder is surgically absent.  There is no biliary dilatation.  The pancreas is atrophied. The spleen, adrenal glands and kidneys appear stable.  No enlarged abdominal lymph nodes are seen.  There is no ascites or suspicious osseous lesion.
IMPRESSION: Stable CT of the abdomen.  No evidence of metastatic disease.
PELVIS CT WITH CONTRAST:
FINDINGS: Descending colostomy and parastomal hernia containing small bowel are unchanged.  There is no evidence of incarceration or bowel obstruction. No enlarged pelvic lymph nodes are demonstrated.  Presacral fibrotic changes status post distal colon resection appear stable.  Some of this soft tissue density is attributed to the patient?s uterus which is posteriorly displaced. There are no suspicious osseous lesions.
IMPRESSION: Stable postsurgical appearance of the pelvis with mild presacral fibrosis and a parastomal hernia surrounding the descending colostomy.   No evidence of bowel obstruction.

## 2007-07-08 ENCOUNTER — Ambulatory Visit: Payer: Self-pay | Admitting: Hematology & Oncology

## 2007-07-10 LAB — CBC WITH DIFFERENTIAL/PLATELET
BASO%: 0.3 % (ref 0.0–2.0)
EOS%: 5 % (ref 0.0–7.0)
MCH: 28.2 pg (ref 26.0–34.0)
MCHC: 34.3 g/dL (ref 32.0–36.0)
RDW: 14.3 % (ref 11.3–14.5)
WBC: 5.7 10*3/uL (ref 3.9–10.0)
lymph#: 1.3 10*3/uL (ref 0.9–3.3)

## 2007-07-10 LAB — COMPREHENSIVE METABOLIC PANEL
ALT: 27 U/L (ref 0–35)
AST: 17 U/L (ref 0–37)
Albumin: 4.4 g/dL (ref 3.5–5.2)
Calcium: 9.1 mg/dL (ref 8.4–10.5)
Chloride: 99 mEq/L (ref 96–112)
Potassium: 4.3 mEq/L (ref 3.5–5.3)
Sodium: 138 mEq/L (ref 135–145)
Total Protein: 6.9 g/dL (ref 6.0–8.3)

## 2007-07-12 ENCOUNTER — Ambulatory Visit (HOSPITAL_COMMUNITY): Admission: RE | Admit: 2007-07-12 | Discharge: 2007-07-12 | Payer: Self-pay | Admitting: Hematology & Oncology

## 2007-07-12 IMAGING — CT CT PELVIS W/ CM
2 of 5 series · 17 of 46 positions shown, 19 images · IV contrast (omnipaque)
Comparison: [DATE]

CHEST CT WITH CONTRAST

CLINICAL DATA: Rectal cancer. Left lower lobe lung nodule.
TECHNIQUE: Multidetector CT imaging of the chest, abdomen, and pelvis was
performed following the standard protocol during bolus administration of
intravenous contrast.

Contrast:  100 cc Omnipaque 300

[Series 2: cap 5.0 b40f · axial · 0.70mm/px · z∈[-368,+178]mm · 14 of 125 slices shown, 16 images]
[im 8/125  soft-tissue]
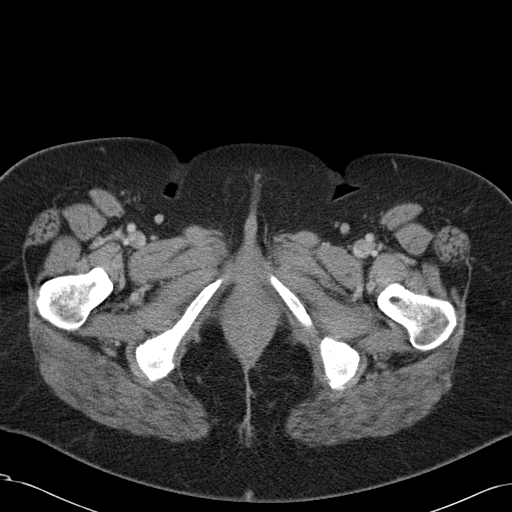
[im 8/125  bone]
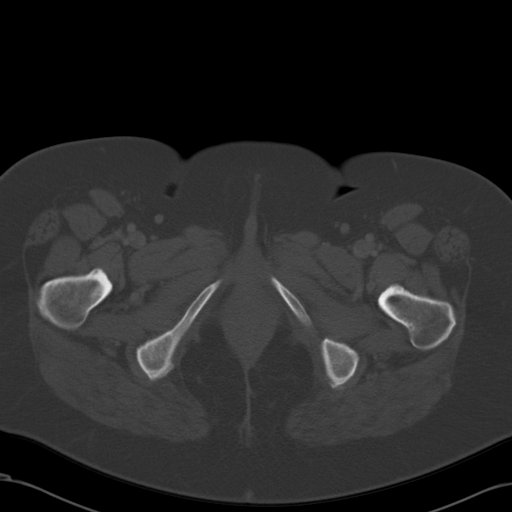
[im 15/125  soft-tissue]
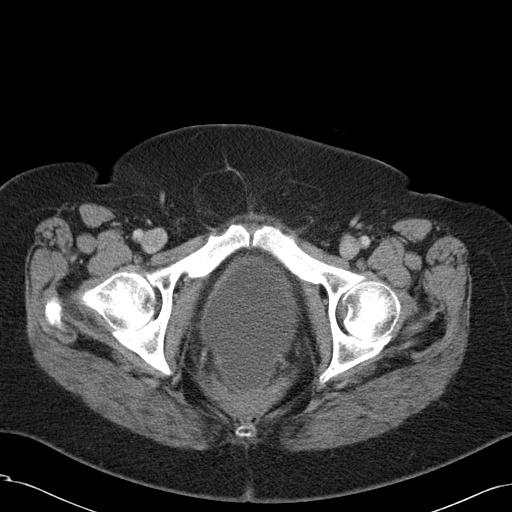
[im 22/125  soft-tissue]
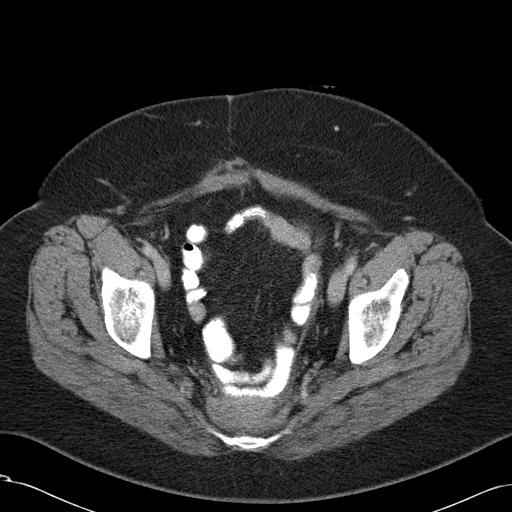
[im 37/125  soft-tissue]
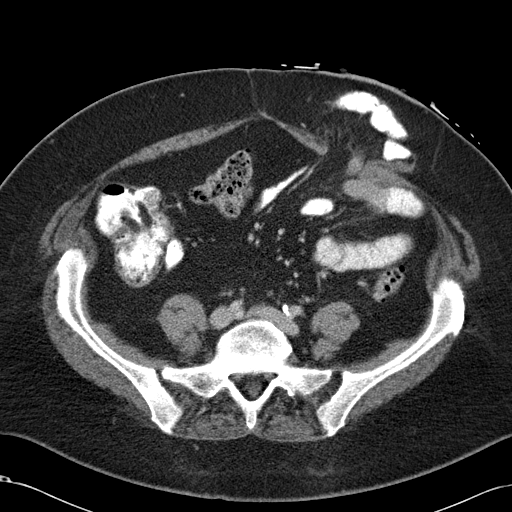
[im 44/125  soft-tissue]
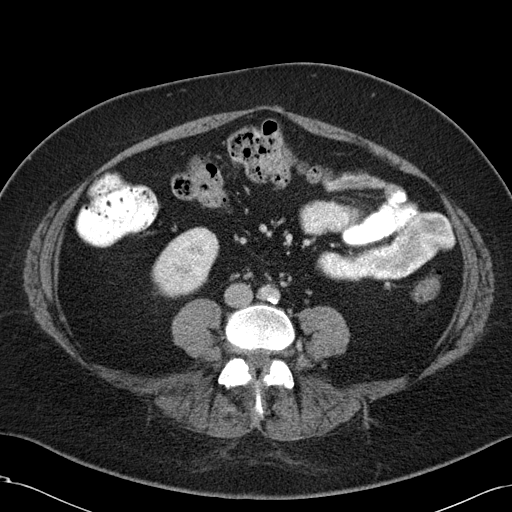
[im 52/125  soft-tissue]
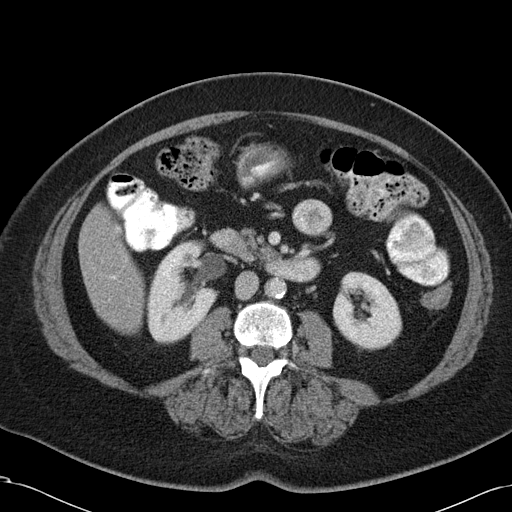
[im 59/125  soft-tissue]
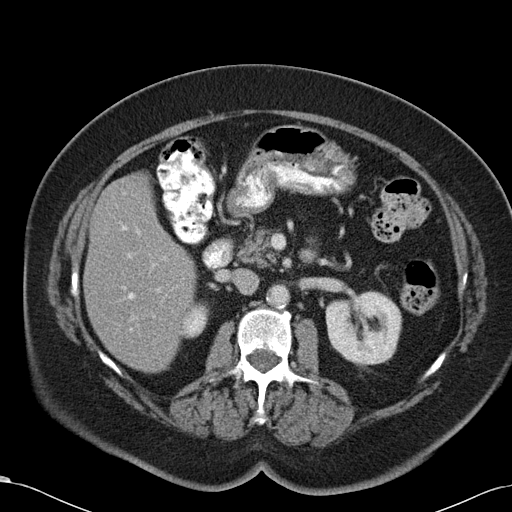
[im 66/125  soft-tissue]
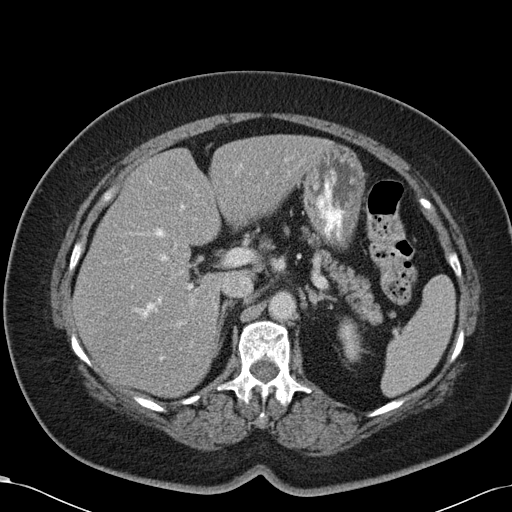
[im 73/125  soft-tissue]
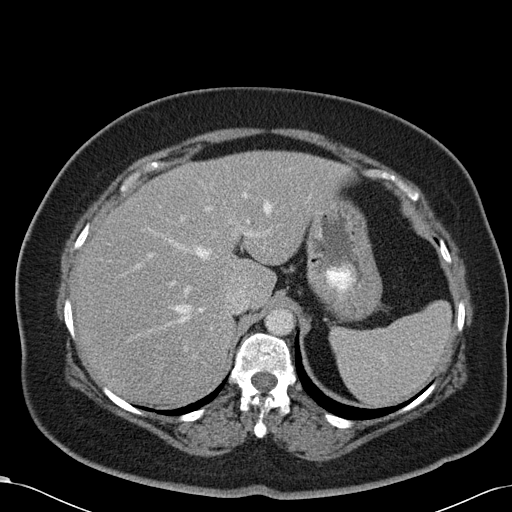
[im 73/125  bone]
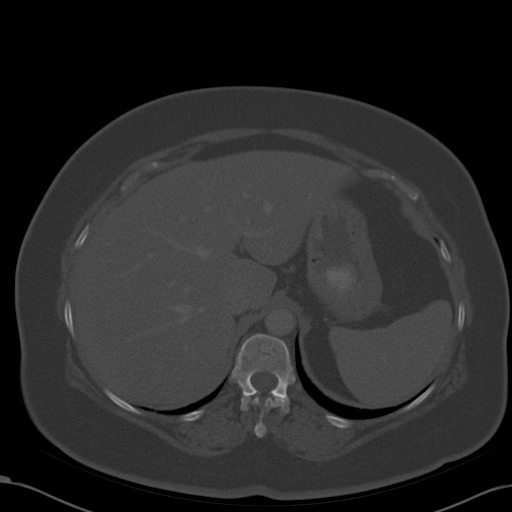
[im 81/125  soft-tissue]
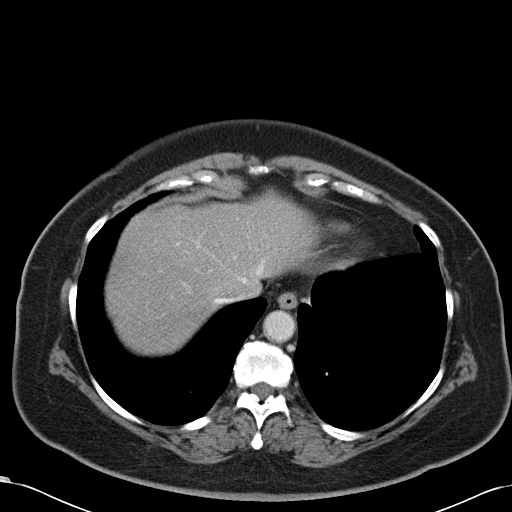
[im 95/125  soft-tissue]
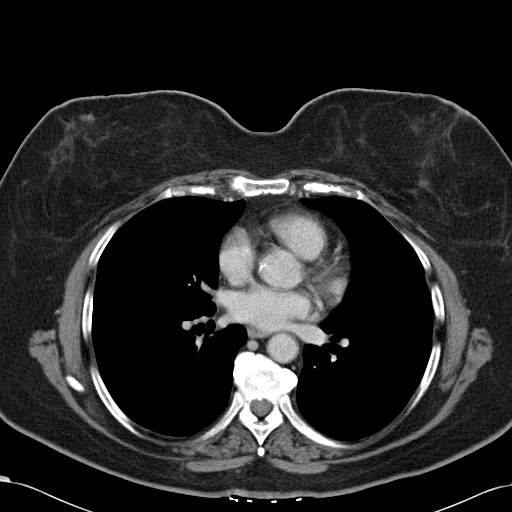
[im 103/125  soft-tissue]
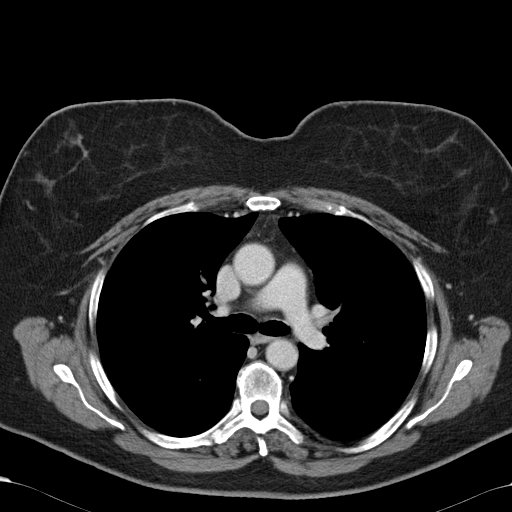
[im 110/125  soft-tissue]
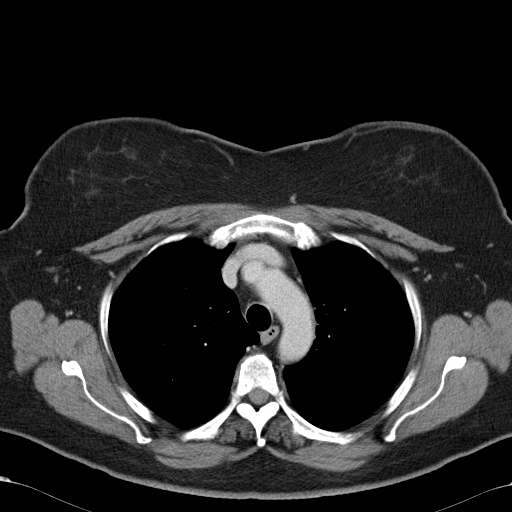
[im 117/125  soft-tissue]
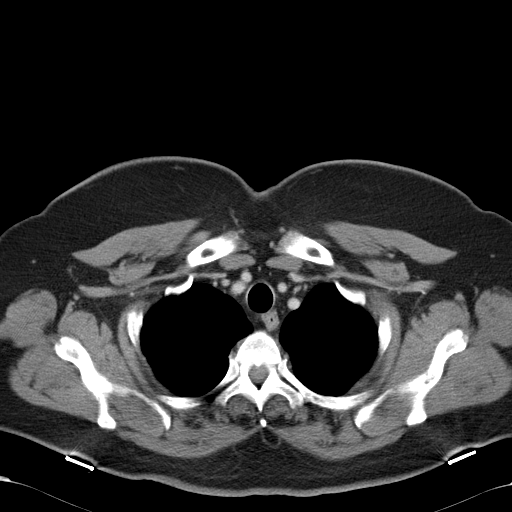

[Series 602: <mpr thick range> · coronal · 1.22mm/px · 3 of 89 slices shown]
[im 30/89  soft-tissue]
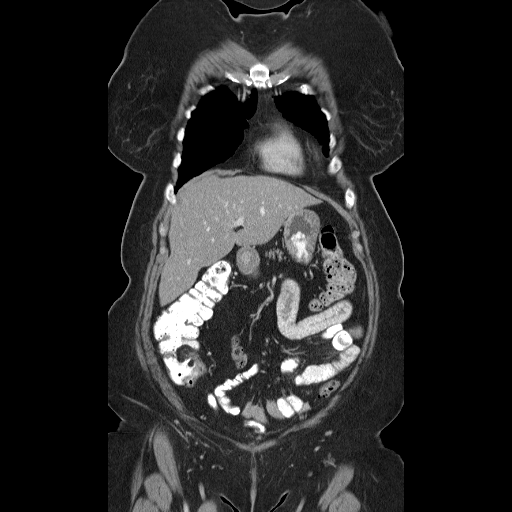
[im 40/89  soft-tissue]
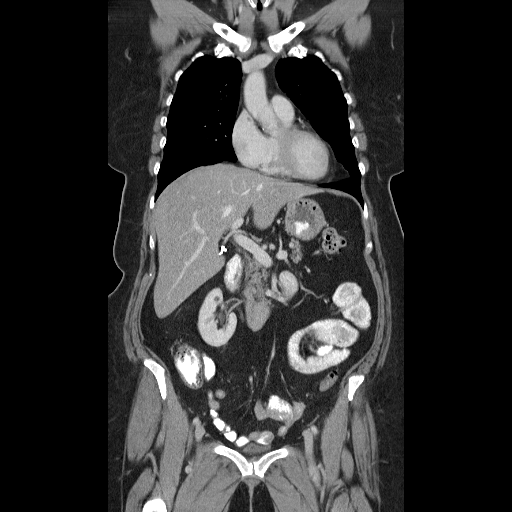
[im 49/89  soft-tissue]
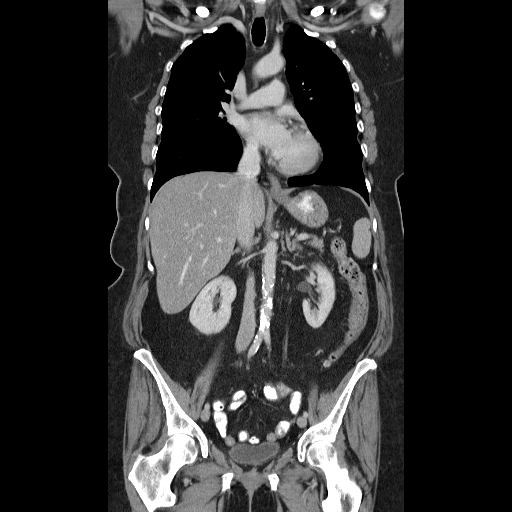

[17 of 46 positions shown; findings below may reference images not displayed]

FINDINGS: No pathologic hilar or mediastinal adenopathy identified. Very faint
and scattered subpleural nodularity is noted in several locations, measuring 2
to 3 mm and unchanged from prior exams. This appearance is likely
postinflammatory.

No findings suspicious for recurrent malignancy in the chest.

IMPRESSION

1. Stable CT appearance of the chest, without findings of malignancy.

ABDOMEN CT WITH CONTRAST
FINDINGS: Gallbladder surgically absent. No hepatic masses currently
identified. The spleen, adrenal glands, and pancreas appear normal. The kidneys
appear normal. Atherosclerosis of the abdominal aorta is present.

IMPRESSION

1. Stable CT appearance of the abdomen, without metastatic disease identified.

PELVIS CT WITH CONTRAST
FINDINGS: Left colostomy noted with a peristomal hernia containing small bowel,
similar to the prior exam. Stable presacral soft tissue density is thought to
represent the posteriorly displaced uterus. Mild presacral fibrosis noted. There
may be a small cystic lesion of the left ovary, which appears mildly hypodense.

No pelvic adenopathy is identified. No specific findings of recurrent pelvic
malignancy.

IMPRESSION

1. Stable CT appearance of the pelvis. No specific findings of malignancy.
2. Peristomal hernia contains small bowel. 
3. Stable appearing hypodensity in the left adnexa likely representing a left
ovarian cyst.

## 2008-01-08 ENCOUNTER — Ambulatory Visit: Payer: Self-pay | Admitting: Hematology & Oncology

## 2008-01-10 LAB — COMPREHENSIVE METABOLIC PANEL
ALT: 23 U/L (ref 0–35)
CO2: 28 mEq/L (ref 19–32)
Calcium: 9 mg/dL (ref 8.4–10.5)
Chloride: 100 mEq/L (ref 96–112)
Creatinine, Ser: 0.9 mg/dL (ref 0.40–1.20)
Glucose, Bld: 203 mg/dL — ABNORMAL HIGH (ref 70–99)
Total Protein: 7.1 g/dL (ref 6.0–8.3)

## 2008-01-10 LAB — CEA: CEA: 1.4 ng/mL (ref 0.0–5.0)

## 2008-01-10 LAB — CBC WITH DIFFERENTIAL/PLATELET
BASO%: 0.6 % (ref 0.0–2.0)
Eosinophils Absolute: 0.3 10*3/uL (ref 0.0–0.5)
HCT: 36.3 % (ref 34.8–46.6)
HGB: 12.4 g/dL (ref 11.6–15.9)
MCHC: 34.3 g/dL (ref 32.0–36.0)
MONO#: 0.5 10*3/uL (ref 0.1–0.9)
NEUT#: 3.5 10*3/uL (ref 1.5–6.5)
NEUT%: 61.3 % (ref 39.6–76.8)
WBC: 5.8 10*3/uL (ref 3.9–10.0)
lymph#: 1.4 10*3/uL (ref 0.9–3.3)

## 2008-01-17 ENCOUNTER — Ambulatory Visit (HOSPITAL_COMMUNITY): Admission: RE | Admit: 2008-01-17 | Discharge: 2008-01-17 | Payer: Self-pay | Admitting: Hematology & Oncology

## 2008-01-17 IMAGING — CT CT ABDOMEN W/ CM
2 of 6 series · 16 of 46 positions shown, 18 images · IV contrast (agent unspecified)
Comparison: Prior CTs of the chest, abdomen and pelvis [DATE].

CT CHEST

CLINICAL DATA: Restaging rectal carcinoma status post completion
of radiation and chemotherapy.

CT CHEST, ABDOMEN AND PELVIS WITH CONTRAST
TECHNIQUE: Multidetector CT imaging of the chest, abdomen and
pelvis was performed following the standard protocol during bolus
administration of intravenous contrast.
Contrast: 100 ml [MY] intravenously.  Oral contrast was
given.

[Series 2: cap 5.0 b40f · axial · 0.81mm/px · z∈[-622,-82]mm · 13 of 126 slices shown, 15 images]
[im 9/126  soft-tissue]
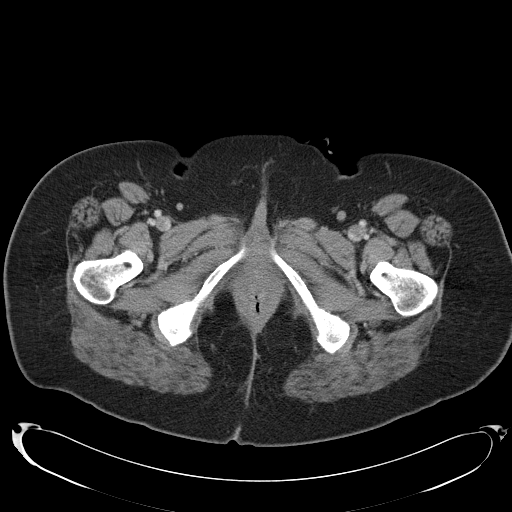
[im 9/126  bone]
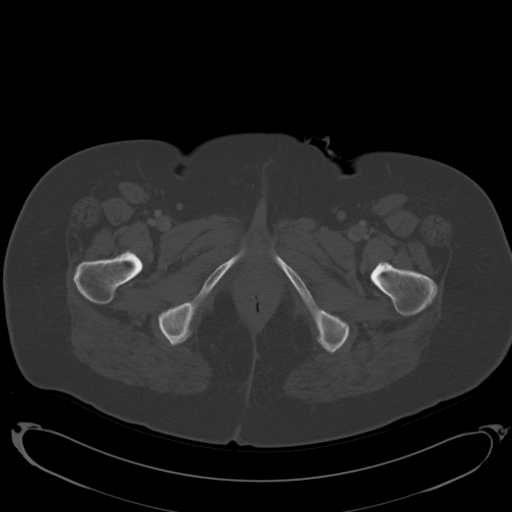
[im 17/126  soft-tissue]
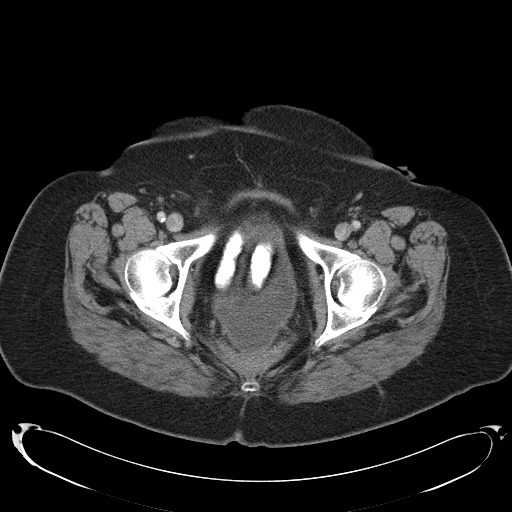
[im 26/126  soft-tissue]
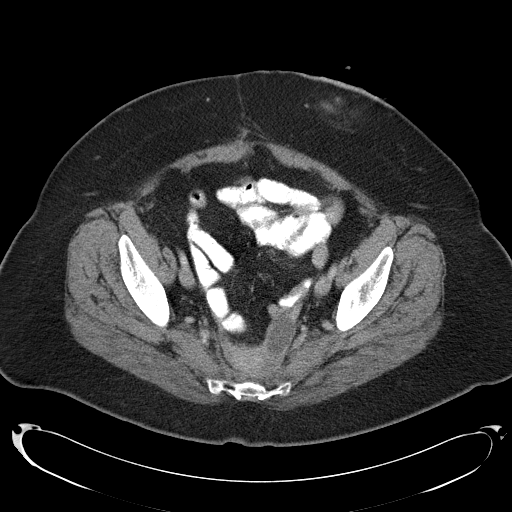
[im 34/126  soft-tissue]
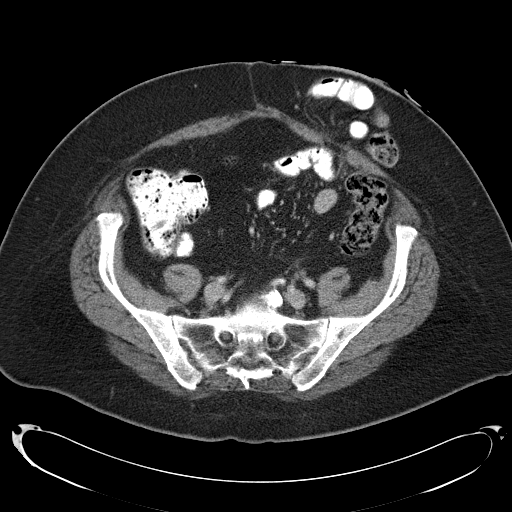
[im 42/126  soft-tissue]
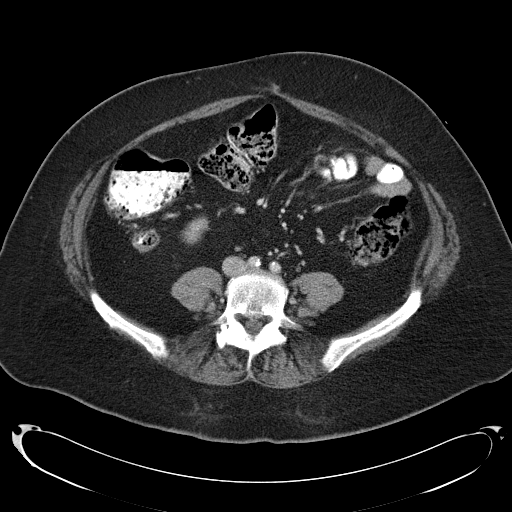
[im 51/126  soft-tissue]
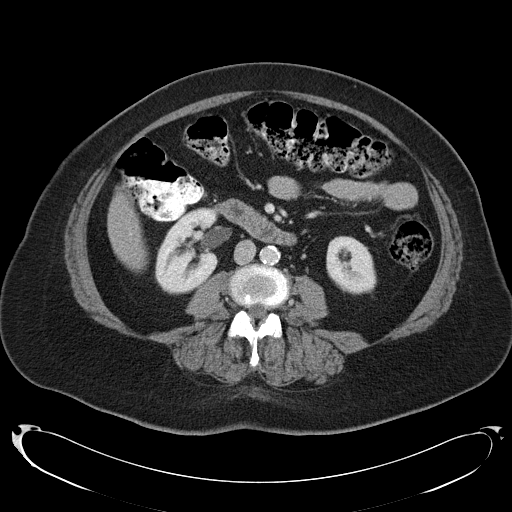
[im 67/126  soft-tissue]
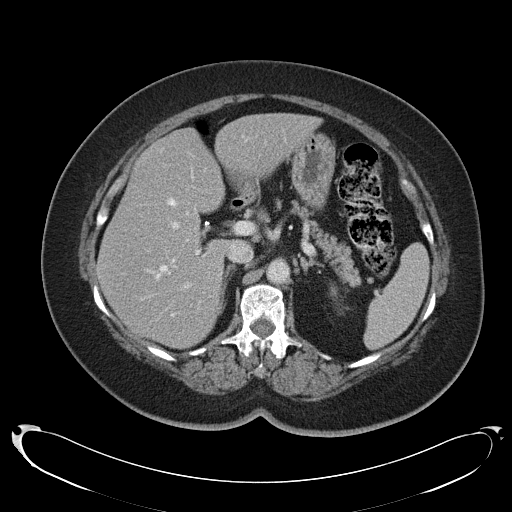
[im 76/126  soft-tissue]
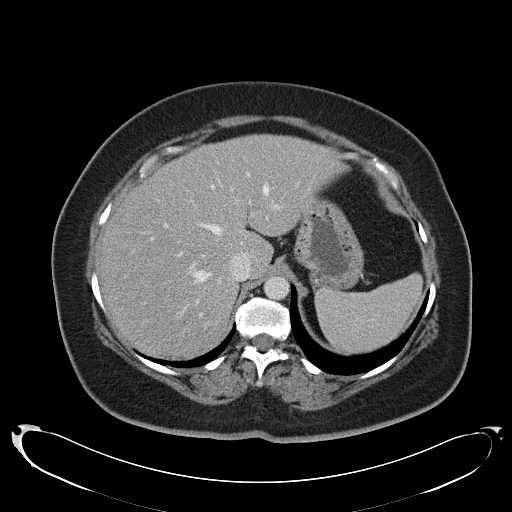
[im 84/126  soft-tissue]
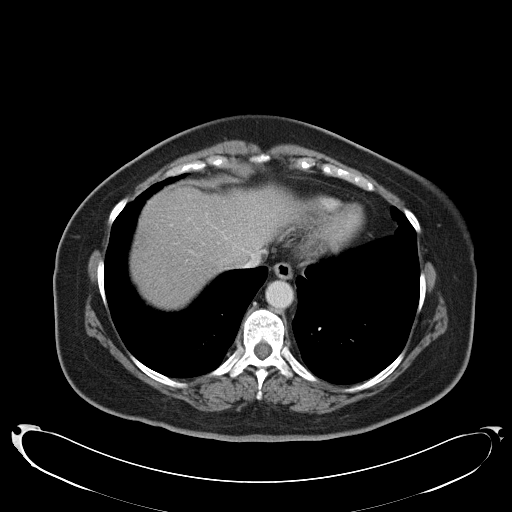
[im 84/126  bone]
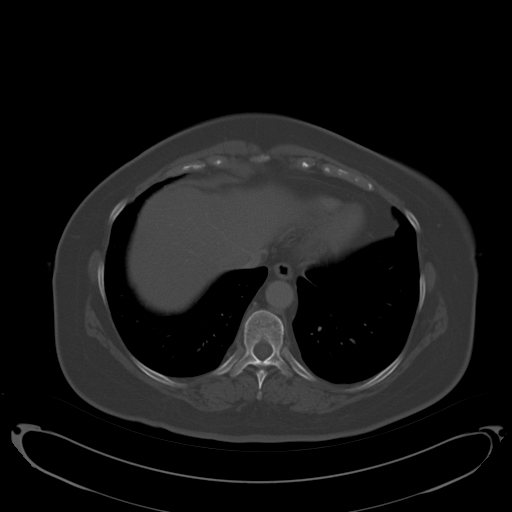
[im 92/126  soft-tissue]
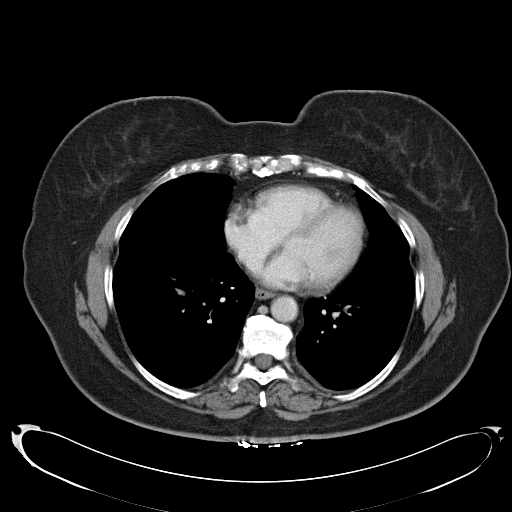
[im 101/126  soft-tissue]
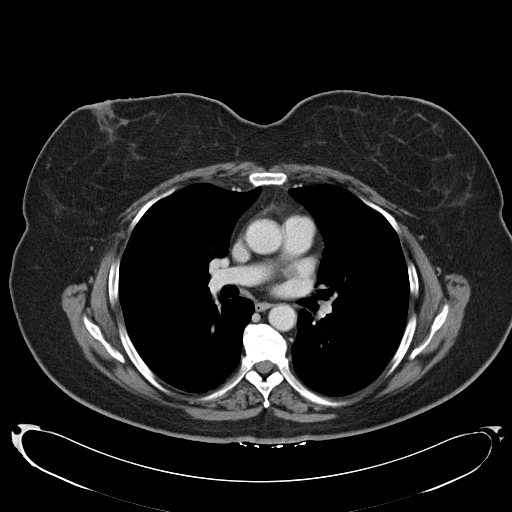
[im 109/126  soft-tissue]
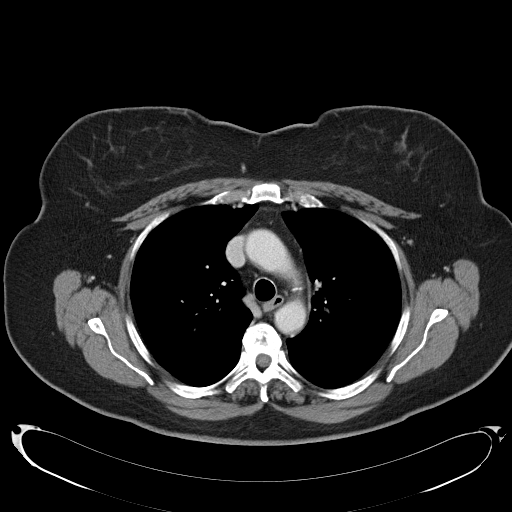
[im 117/126  soft-tissue]
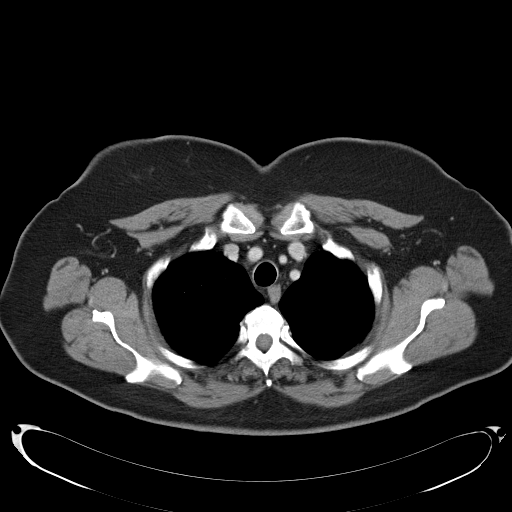

[Series 602: <mpr thick range> · coronal · 1.23mm/px · 3 of 101 slices shown]
[im 34/101  soft-tissue]
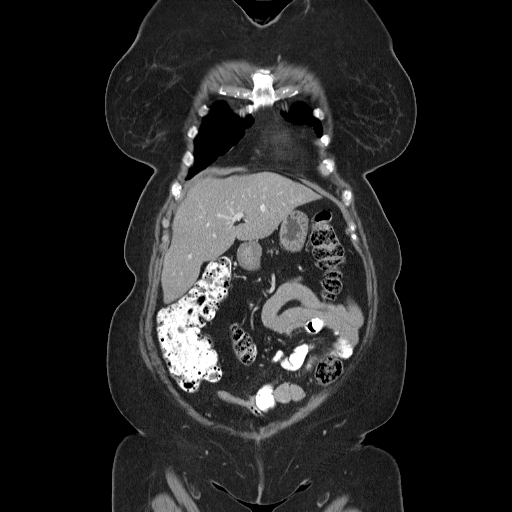
[im 45/101  soft-tissue]
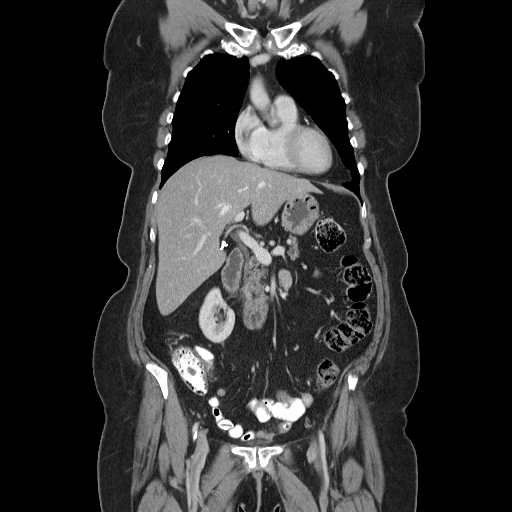
[im 56/101  soft-tissue]
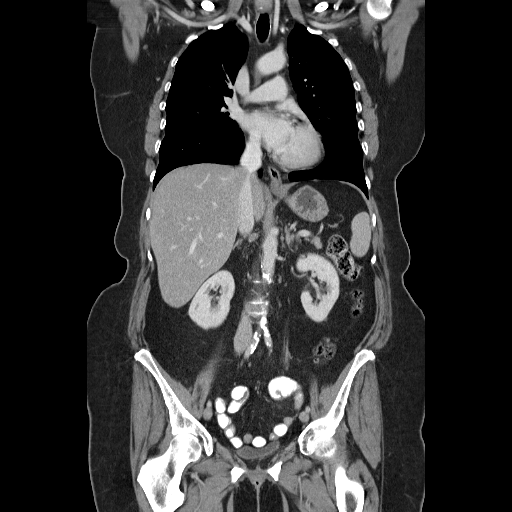

[16 of 46 positions shown; findings below may reference images not displayed]

FINDINGS: The mediastinum appears stable without pathologically
enlarged lymph nodes.  Faint coronary artery calcifications are
again noted.  There is no pleural or pericardial effusion.

Scattered small subpleural nodules in both lungs are unchanged and
remain most compatible with postinflammatory scarring.  No new or
enlarging nodules are identified.  There are no suspicious osseous
findings.
IMPRESSION: 1.  Stable CT of the chest with scattered subpleural nodularity
most compatible with postinflammatory scarring.
2.  No evidence of metastatic disease.

CT ABDOMEN
FINDINGS: Mild hepatic steatosis is noted.  There are no focal
liver lesions.  There is no biliary dilatation status post
cholecystectomy.  The spleen, pancreas, adrenal glands and kidneys
appear normal.  No enlarged lymph nodes are demonstrated.  There is
no ascites or peritoneal nodularity.  Aortic atherosclerosis and
lumbar spondylosis appear stable.
IMPRESSION: 1.  Stable CT of the abdomen.
2.  No evidence of metastatic disease.

CT PELVIS
FINDINGS: Descending colostomy and parastomal hernia containing
small bowel are unchanged.  There is no evidence of bowel
obstruction.  Presacral soft tissue is unchanged and again
attributed to a combination of postsurgical fibrosis and a
posteriorly displaced uterus.  However, there is an enlarging low
density structure in the left adnexa measuring 3.9 x 2.3 cm
transverse on image 100 compared with 2.4 x 1.4 cm on the most
recent examination.  There are no enlarged pelvic lymph nodes.
There is no evidence of ureteral obstruction.
IMPRESSION: 1.  Enlarging low density left adnexal structure.  As suggested
previously, this may reflect a cystic lesion of the left ovary
(assuming the patient still has her uterus and ovaries).  Even so,
given the patient's age, this requires further evaluation to
exclude ovarian cystic neoplasm and atypical recurrence of rectal
carcinoma.  Correlation with [MY] levels and pelvic ultrasound is
recommended.  PET CT may also be helpful.
2.  Stable parastomal hernia status post descending colostomy.

## 2008-01-23 ENCOUNTER — Ambulatory Visit: Payer: Self-pay | Admitting: Hematology & Oncology

## 2008-07-07 ENCOUNTER — Encounter: Admission: RE | Admit: 2008-07-07 | Discharge: 2008-07-07 | Payer: Self-pay | Admitting: Family Medicine

## 2008-07-07 IMAGING — CR DG FOOT 2V*L*
2 series · 2 of 2 positions shown · non-contrast
Comparison: None

CLINICAL DATA: Left lateral foot pain for several years, no trauma

LEFT FOOT - 2 VIEW

[view not recorded (1 of 2)]
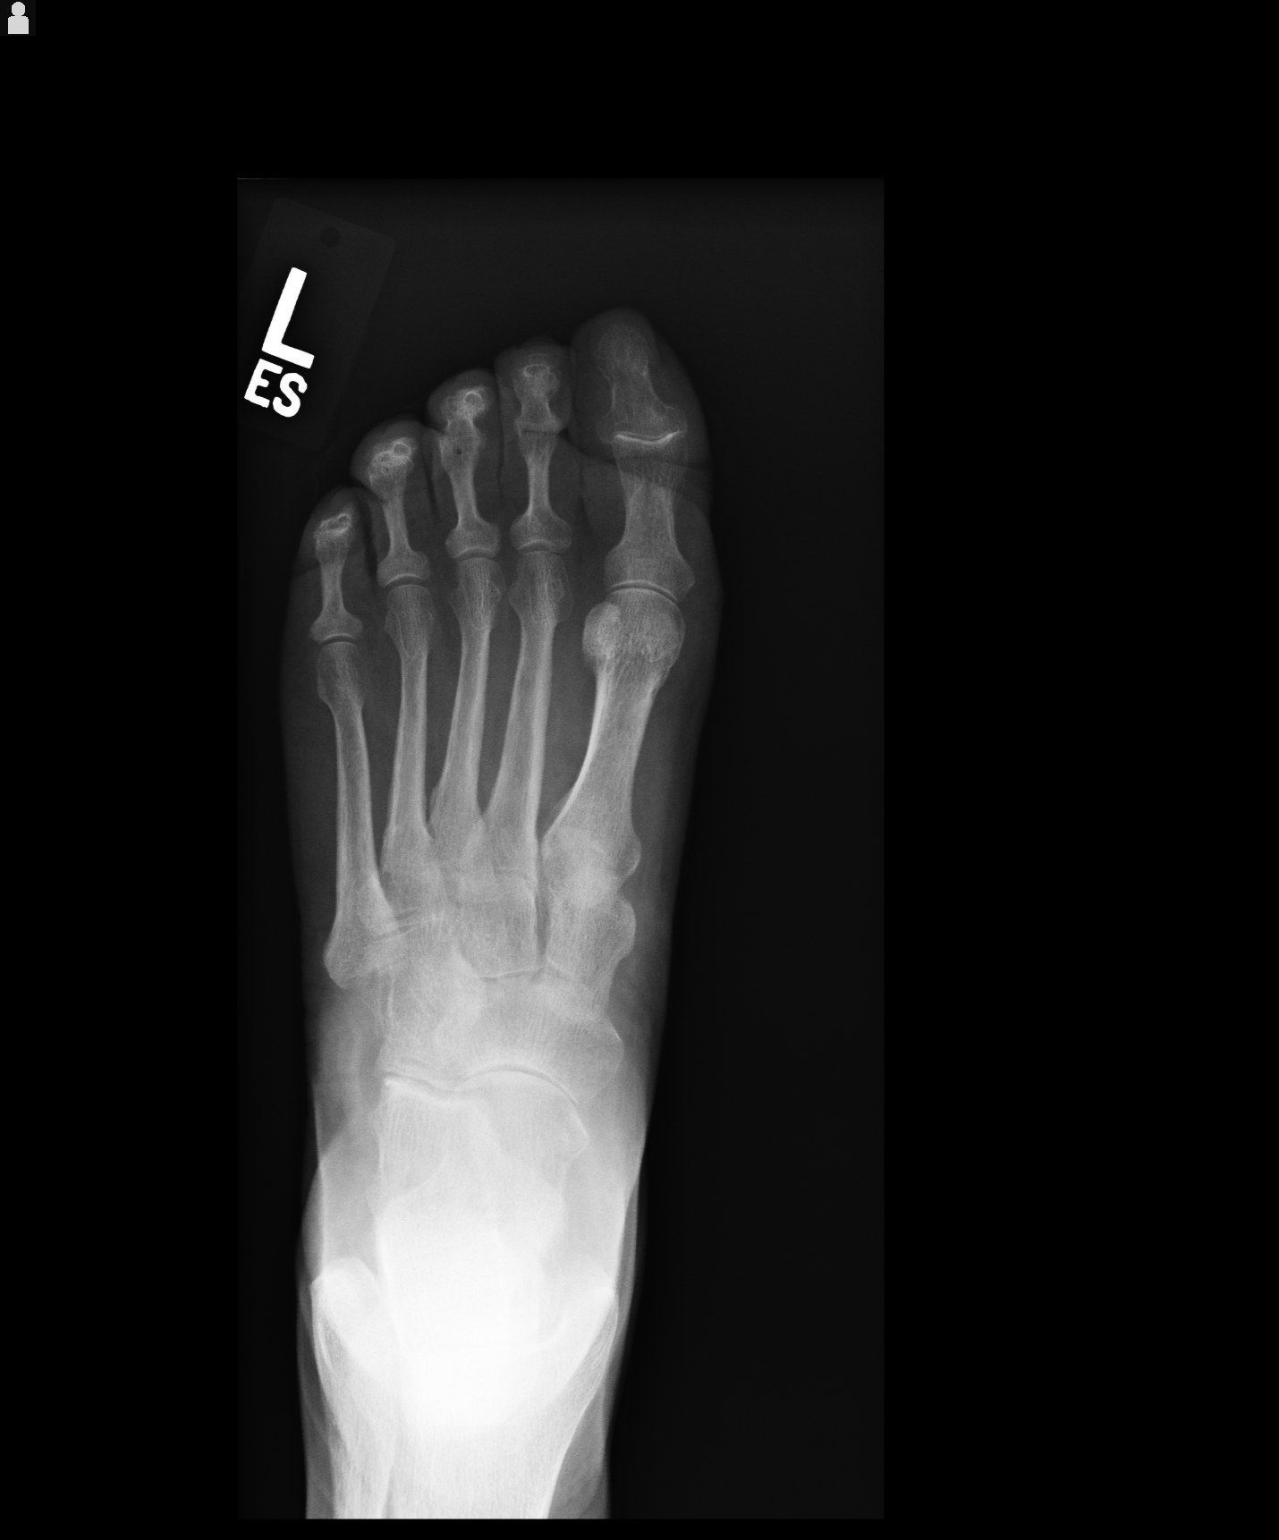

[view not recorded (2 of 2)]
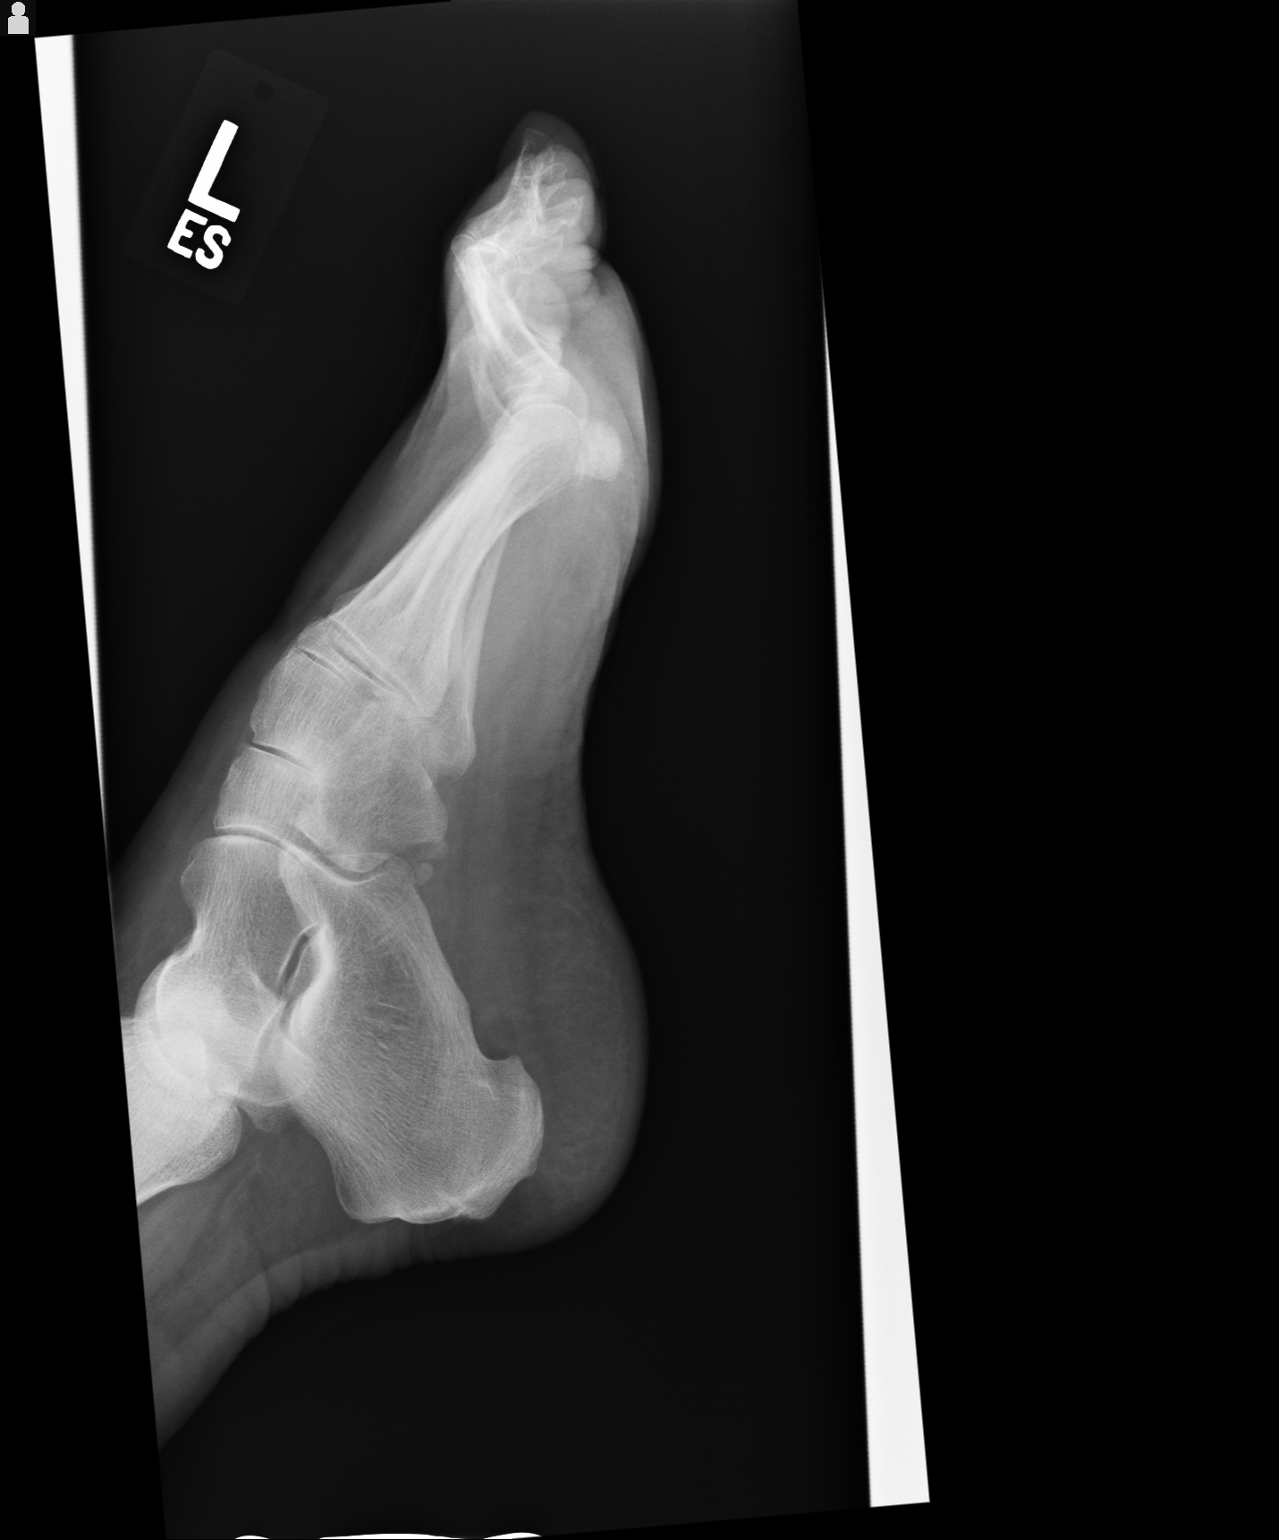

[2 of 2 positions shown; findings below may reference images not displayed]

FINDINGS: No acute bony abnormality is seen.  Tarsal - metatarsal
alignment is normal.  There is mild degenerative change at the left
first MTP joint.  No erosion is seen.
IMPRESSION: No acute abnormality.

## 2008-09-24 ENCOUNTER — Encounter: Payer: Self-pay | Admitting: Cardiology

## 2009-01-06 ENCOUNTER — Ambulatory Visit: Payer: Self-pay | Admitting: Hematology

## 2009-01-08 LAB — CBC WITH DIFFERENTIAL/PLATELET
Basophils Absolute: 0 10*3/uL (ref 0.0–0.1)
Eosinophils Absolute: 0.4 10*3/uL (ref 0.0–0.5)
HCT: 34.9 % (ref 34.8–46.6)
LYMPH%: 22.1 % (ref 14.0–49.7)
MCV: 85.7 fL (ref 79.5–101.0)
MONO#: 0.4 10*3/uL (ref 0.1–0.9)
MONO%: 5.8 % (ref 0.0–14.0)
NEUT#: 4.6 10*3/uL (ref 1.5–6.5)
NEUT%: 65.9 % (ref 38.4–76.8)
Platelets: 311 10*3/uL (ref 145–400)
WBC: 6.9 10*3/uL (ref 3.9–10.3)

## 2009-01-08 LAB — COMPREHENSIVE METABOLIC PANEL
Alkaline Phosphatase: 100 U/L (ref 39–117)
BUN: 15 mg/dL (ref 6–23)
CO2: 22 mEq/L (ref 19–32)
Creatinine, Ser: 0.89 mg/dL (ref 0.40–1.20)
Glucose, Bld: 202 mg/dL — ABNORMAL HIGH (ref 70–99)
Total Bilirubin: 0.3 mg/dL (ref 0.3–1.2)
Total Protein: 6.8 g/dL (ref 6.0–8.3)

## 2009-01-08 LAB — CEA: CEA: 1.1 ng/mL (ref 0.0–5.0)

## 2009-01-12 ENCOUNTER — Ambulatory Visit (HOSPITAL_COMMUNITY): Admission: RE | Admit: 2009-01-12 | Discharge: 2009-01-12 | Payer: Self-pay | Admitting: Hematology & Oncology

## 2009-01-12 IMAGING — CT CT CHEST W/ CM
2 of 6 series · 16 of 46 positions shown, 18 images · IV contrast (agent unspecified)
Comparison: [DATE] and [DATE]

CT CHEST

CLINICAL DATA: Rectal cancer diagnosed in [ES].  Chemotherapy and
radiation therapy.  Evaluate for rectal cancer recurrence.

CT CHEST, ABDOMEN AND PELVIS WITH CONTRAST
TECHNIQUE: Contiguous axial images of the chest abdomen and pelvis
were obtained after IV contrast administration.
Contrast: 100  ml [ES]

[Series 3: cap with st · axial · 0.85mm/px · z∈[-477,+78]mm · 13 of 129 slices shown, 15 images]
[im 9/129  soft-tissue]
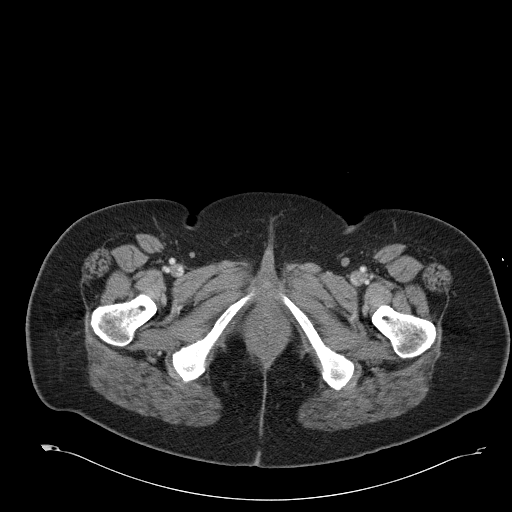
[im 9/129  bone]
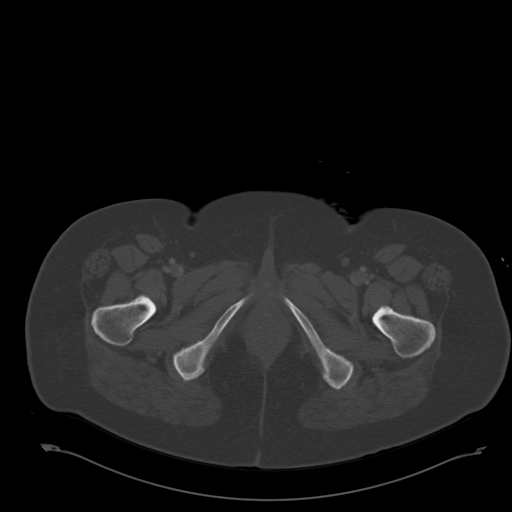
[im 18/129  soft-tissue]
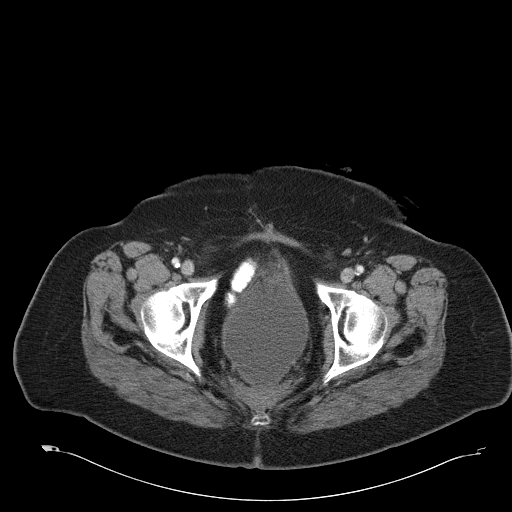
[im 26/129  soft-tissue]
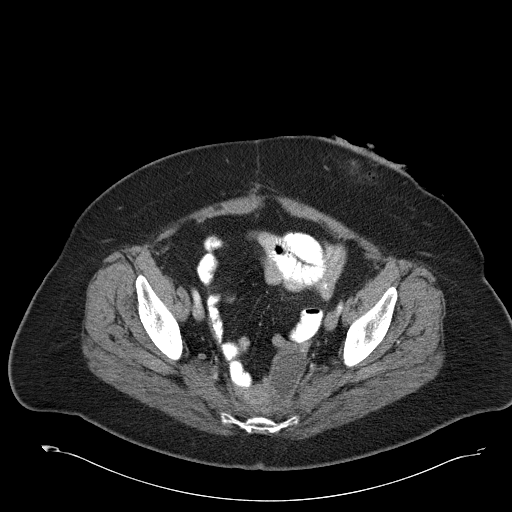
[im 35/129  soft-tissue]
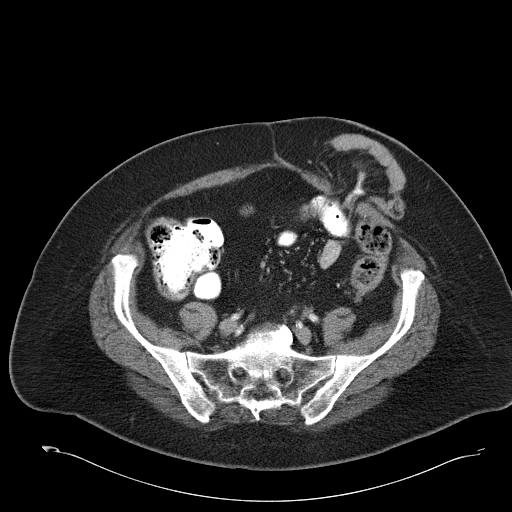
[im 43/129  soft-tissue]
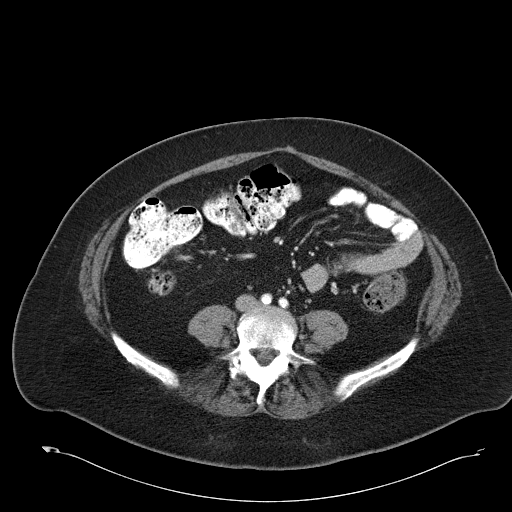
[im 52/129  soft-tissue]
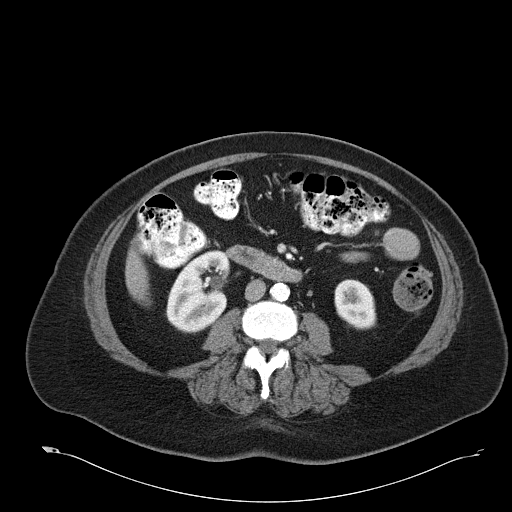
[im 69/129  soft-tissue]
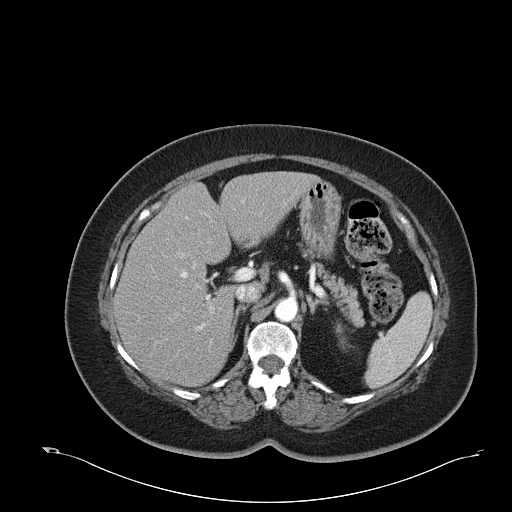
[im 77/129  soft-tissue]
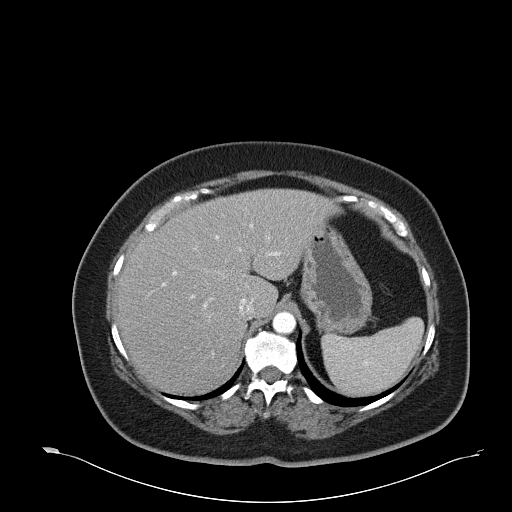
[im 86/129  soft-tissue]
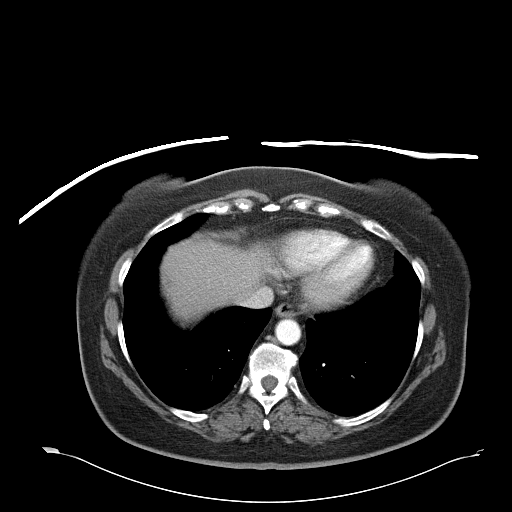
[im 86/129  bone]
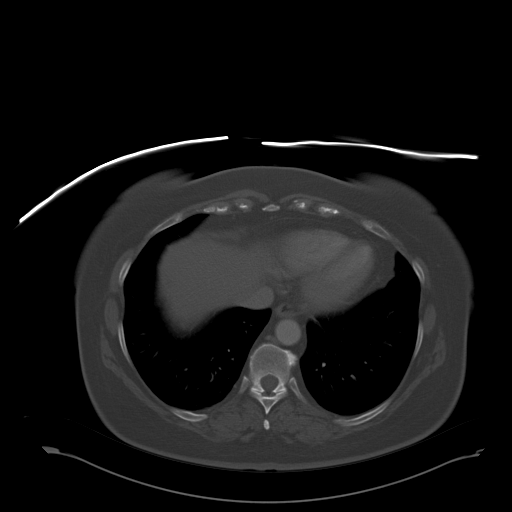
[im 94/129  soft-tissue]
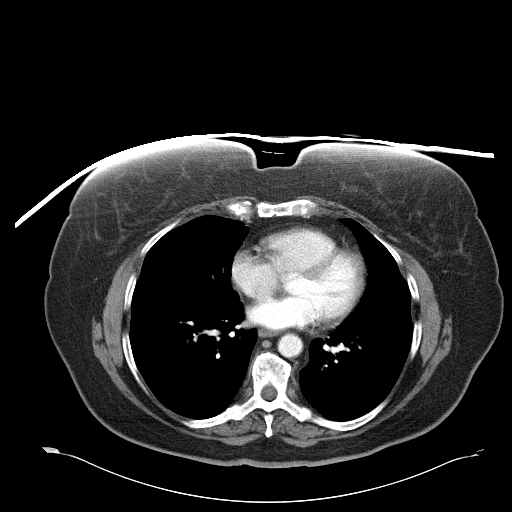
[im 103/129  soft-tissue]
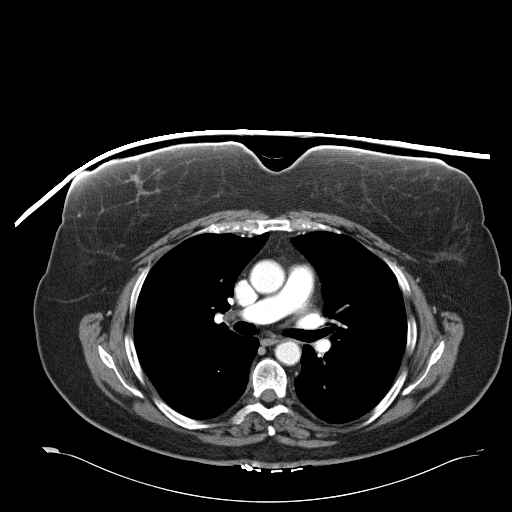
[im 111/129  soft-tissue]
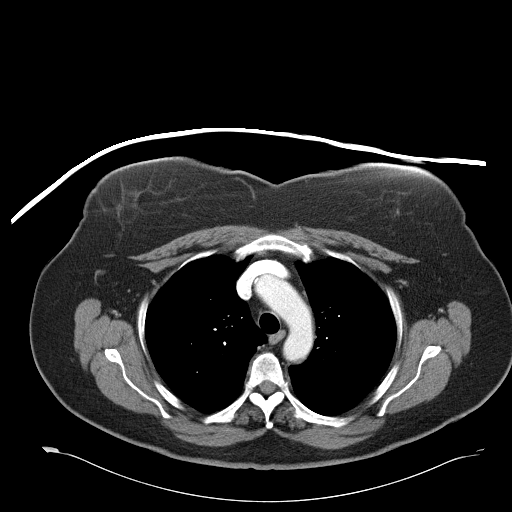
[im 120/129  soft-tissue]
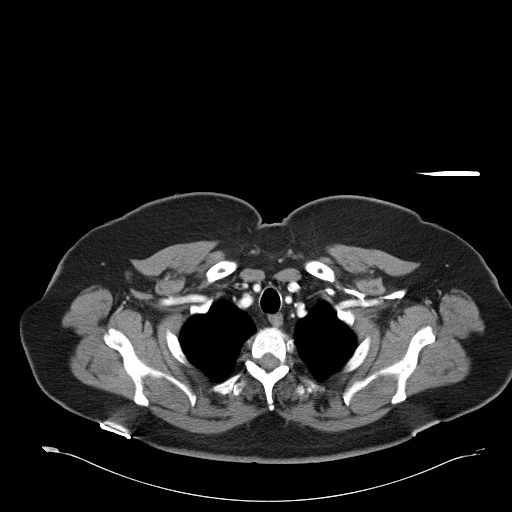

[Series 602: <mpr thick range> · coronal · 1.26mm/px · 3 of 100 slices shown]
[im 34/100  soft-tissue]
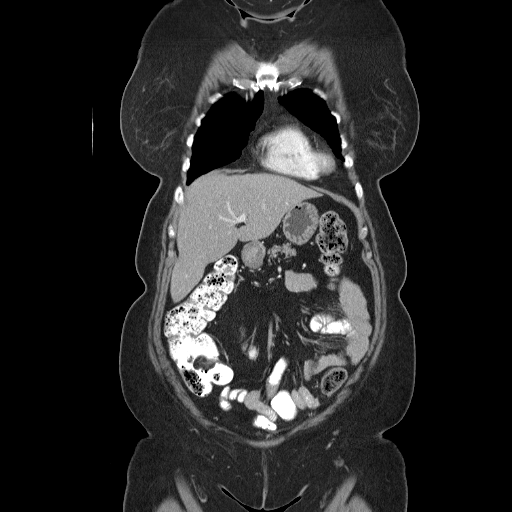
[im 45/100  soft-tissue]
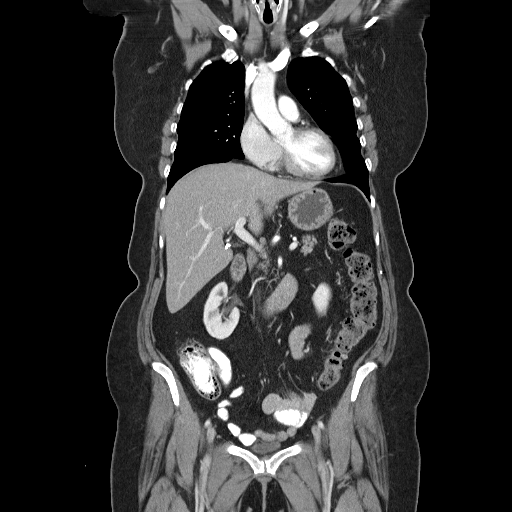
[im 56/100  soft-tissue]
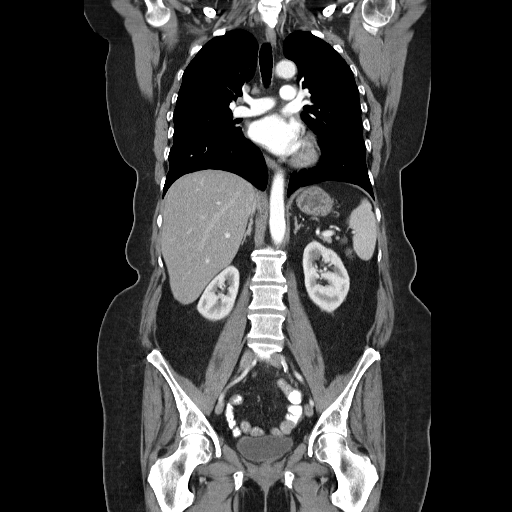

[16 of 46 positions shown; findings below may reference images not displayed]

FINDINGS: Lung windows demonstrate stable tiny left lower lobe lung
nodule at 2 mm on image 47.  Similar subpleural scarring in the
left lower lobe on image 42.

Soft tissue windows demonstrate normal heart size without
pericardial or pleural effusion.  Probable coronary artery
atherosclerosis. No mediastinal or hilar adenopathy.
IMPRESSION: No acute process or evidence of metastatic disease in the chest.

CT ABDOMEN
FINDINGS: Mild fatty infiltration of the liver.  Probable surgical
clip versus less likely calcification along the posterior right
hepatic capsule.  No focal liver lesions.  Normal spleen, stomach.
Cholecystectomy without biliary ductal dilatation.  Normal adrenal
glands and kidneys.  Moderate aortic atherosclerosis.  Numerous
right renal arteries. No retroperitoneal or retrocrural adenopathy.
Descending colonic diverticulosis.  Normal terminal ileum. Normal
abdominal small bowel without ascites.
IMPRESSION: 1. No acute process or evidence of metastatic disease in the
abdomen.
2.  Mild fatty infiltration of the liver.

CT PELVIS
FINDINGS: The patient is status post left lower quadrant and
colostomy.  Similar appearance of a parastomal hernia containing
nonobstructive small bowel. No pelvic adenopathy.    Normal urinary
bladder.  Similar configuration of presacral soft tissue density.
Foci of air within are decreased in conspicuity since the prior
exam.

A left pelvic low density focus measures 4.6 x 2.6 cm on image 102.
Slightly enlarged from 3.9 x 2.3 cm on the prior exam.  3.1 cm
cranial caudal on coronal image 67.  2.7 cm on the prior exam. No
significant free fluid.  No acute osseous abnormality. Degenerative
disc disease at the lumbosacral junction.
IMPRESSION: 1.  Similar appearance of presacral soft tissue and gas density.
Again felt to be secondary to postoperative fibrosis and possibly
the posterior displaced uterus.  Correlate with prior hysterectomy.
The gas within is less conspicuous than on the prior and likely
indicative of air within the vaginal fornices.
2.  Slight enlargement of the left pelvic low density collection.
Considerations again include a left ovarian cystic lesion or less
likely, a slow growing focus of residual or recurrent disease.
Correlate with whether patient has had a prior left oophorectomy.
Contrast enhanced pelvic MR DULOVIC be informative to differentiate a
cystic ovarian lesion from a extra ovarian postoperative focus.  In
addition, PET, or short-term follow-up CT should be considered.

## 2009-01-18 ENCOUNTER — Ambulatory Visit: Payer: Self-pay | Admitting: Hematology & Oncology

## 2009-11-01 ENCOUNTER — Inpatient Hospital Stay (HOSPITAL_COMMUNITY): Admission: EM | Admit: 2009-11-01 | Discharge: 2009-11-03 | Payer: Self-pay | Admitting: Emergency Medicine

## 2009-11-01 IMAGING — CT CT ABD-PELV W/ CM
2 of 6 series · 16 of 46 positions shown, 18 images · IV contrast (omnipaque)
Comparison: [DATE]

CLINICAL DATA: Abdominal pain.  Mildly elevated white count.
History of rectal cancer.

CT ABDOMEN AND PELVIS WITH CONTRAST
TECHNIQUE: Multidetector CT imaging of the abdomen and pelvis was
performed following the standard protocol during bolus
administration of intravenous contrast.
Contrast: 100 ml Omnipaque 300 IV.

[Series 2: rtn ap with st · axial · 0.74mm/px · z∈[-438,-54]mm · 13 of 87 slices shown, 15 images]
[im 5/87  soft-tissue]
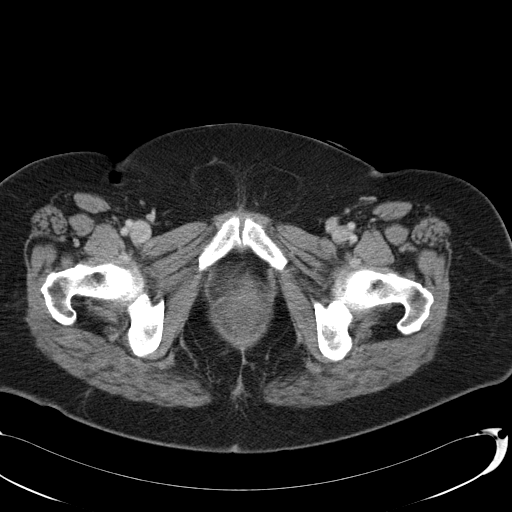
[im 5/87  bone]
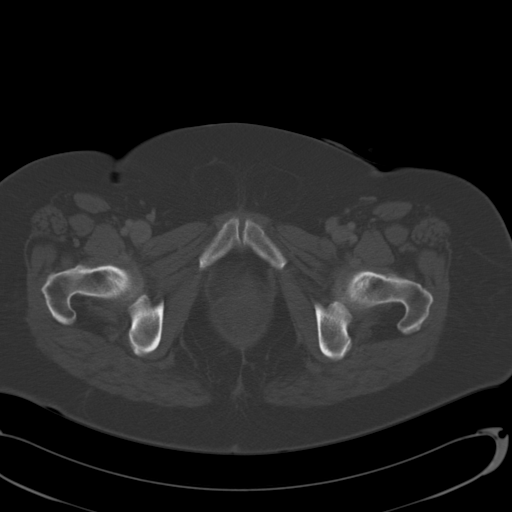
[im 10/87  soft-tissue]
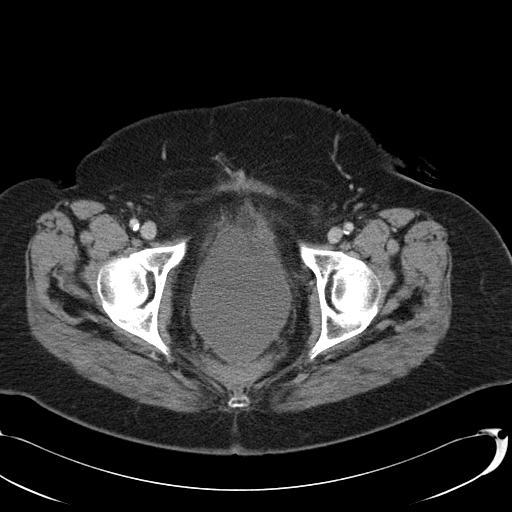
[im 20/87  soft-tissue]
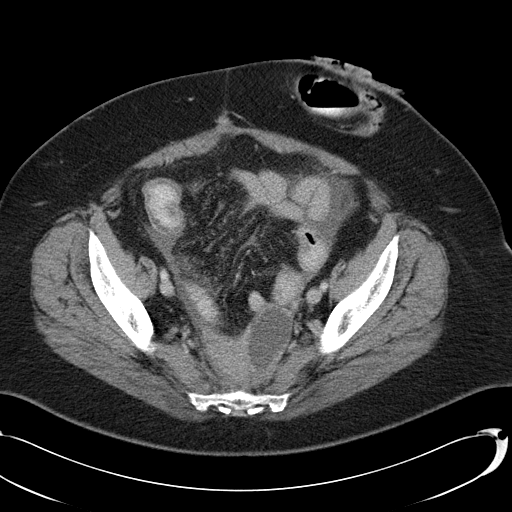
[im 24/87  soft-tissue]
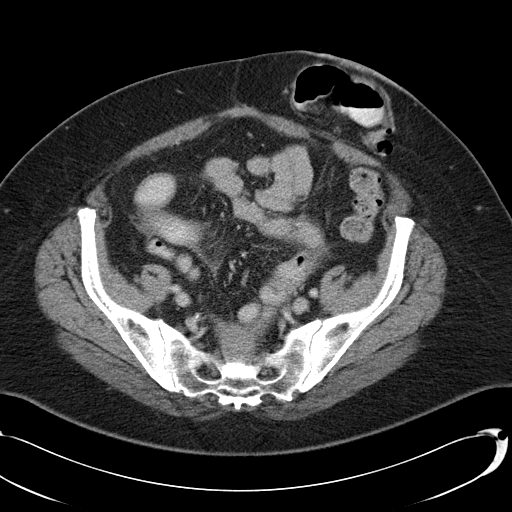
[im 29/87  soft-tissue]
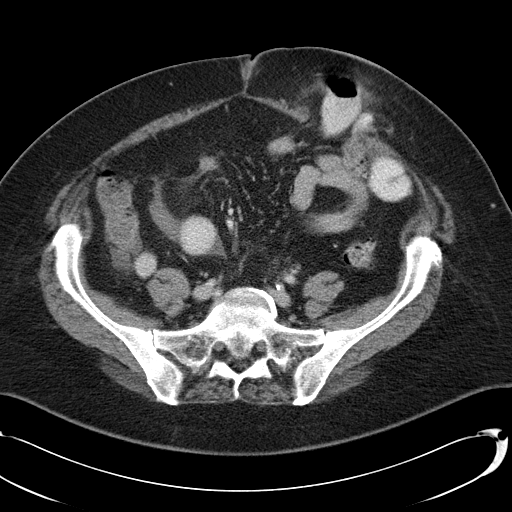
[im 39/87  soft-tissue]
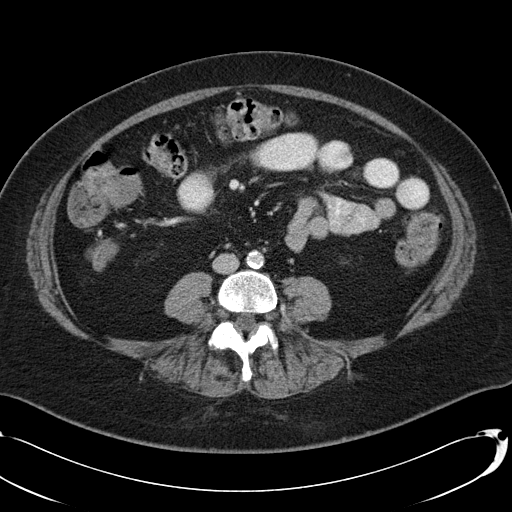
[im 44/87  soft-tissue]
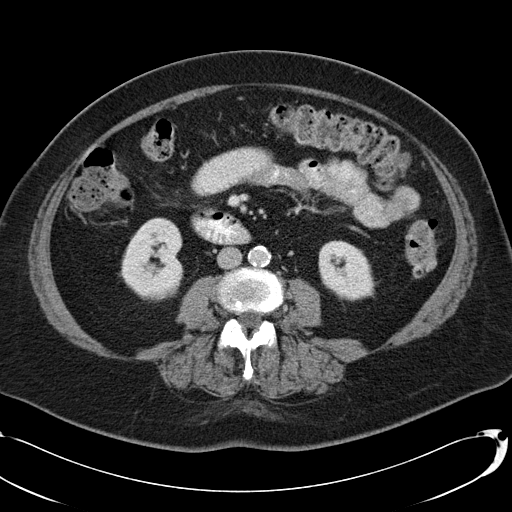
[im 48/87  soft-tissue]
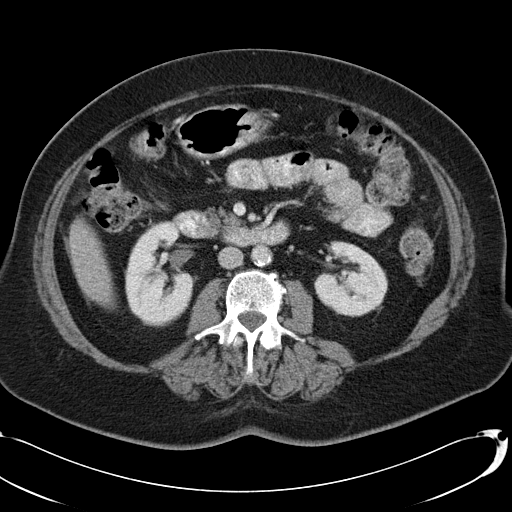
[im 58/87  soft-tissue]
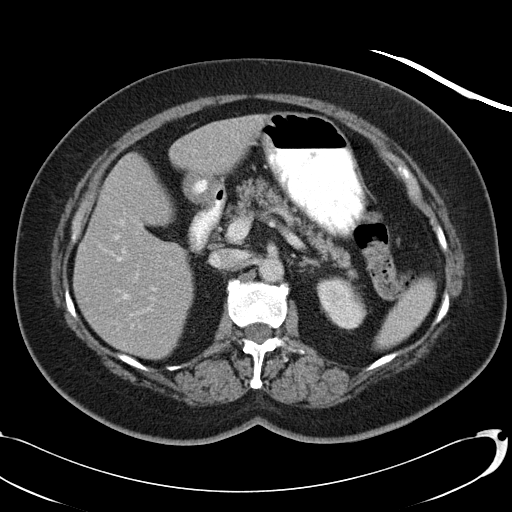
[im 58/87  bone]
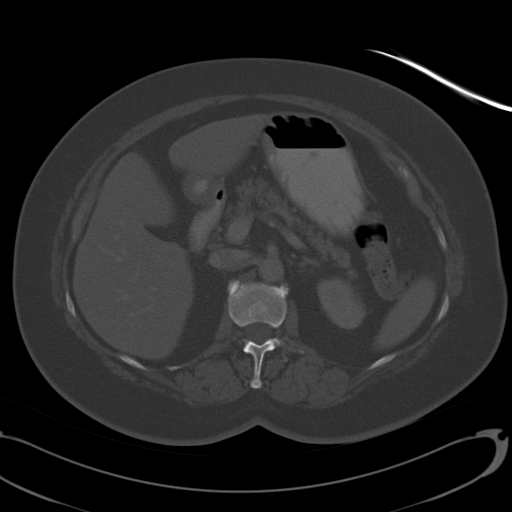
[im 63/87  soft-tissue]
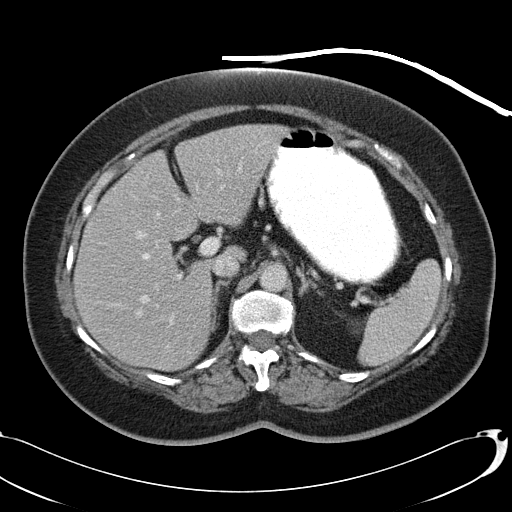
[im 67/87  soft-tissue]
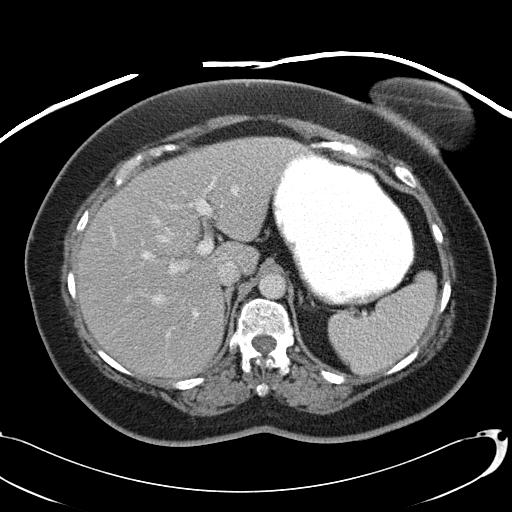
[im 77/87  soft-tissue]
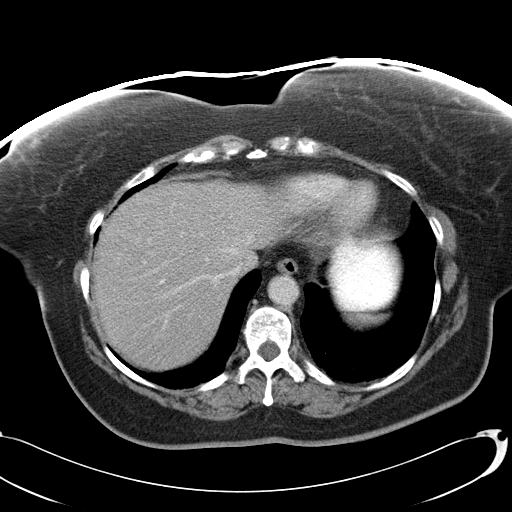
[im 82/87  soft-tissue]
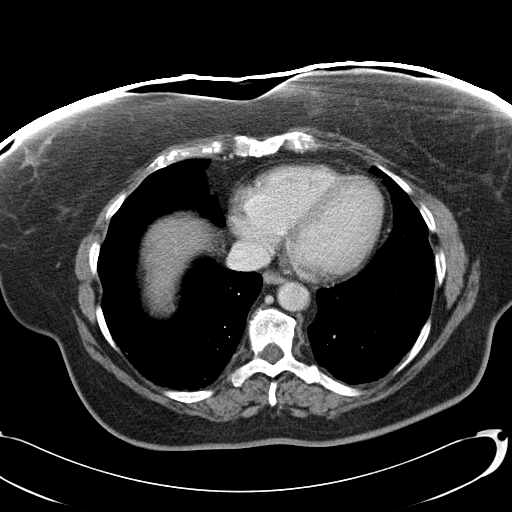

[Series 602: <mpr thick range> · coronal · 0.88mm/px · 3 of 92 slices shown]
[im 31/92  soft-tissue]
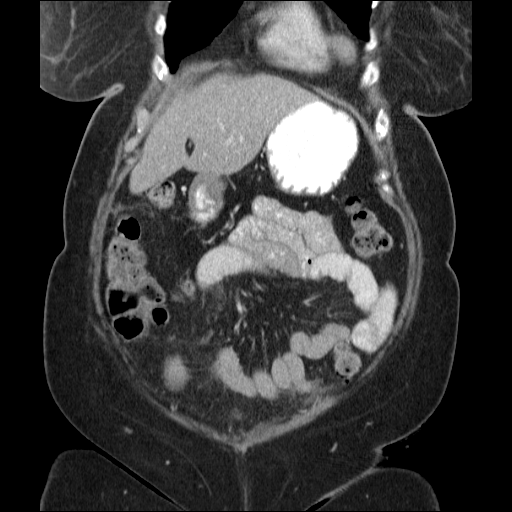
[im 41/92  soft-tissue]
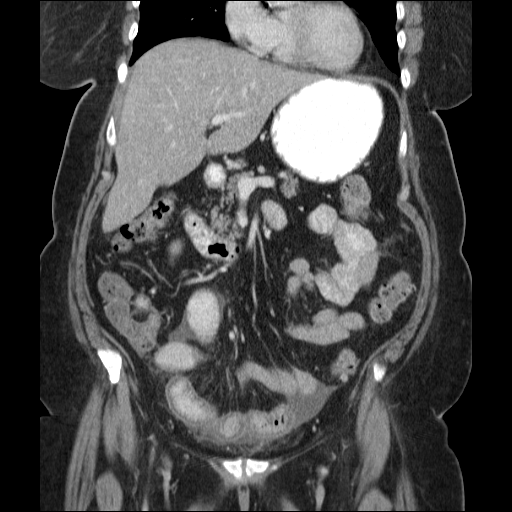
[im 51/92  soft-tissue]
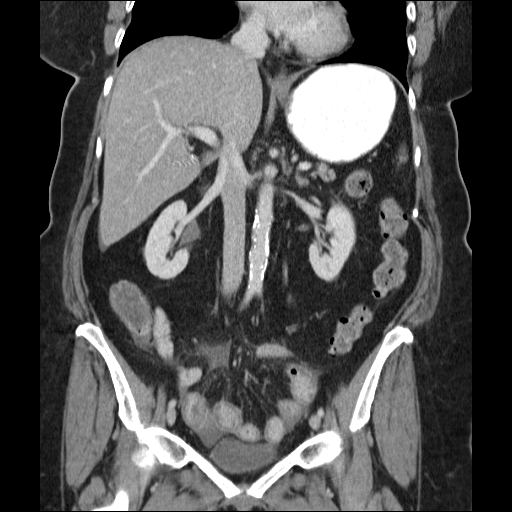

[16 of 46 positions shown; findings below may reference images not displayed]

FINDINGS: Dependent atelectasis in the lung bases.  No effusions.
Heart is normal size.

Prior cholecystectomy.  There is mild fatty infiltration of the
liver, unchanged.  No focal abnormality in the liver.  Spleen,
pancreas, adrenals and kidneys are unremarkable.

Left lower quadrant ostomy is present.  There is herniation of
small bowel loops through the ostomy defect.  This was present on
prior study.  Small bowel loops are dilated into the pelvis.  The
herniation of small bowel loops into the ostomy defect does not
appear to be the cause of the obstruction.  Within the pelvis,
there are abnormally thick walled small bowel loops with wall
edema.  There is surrounding inflammatory change and free fluid.
This causes a caliber change in the small bowel compatible with
mild small bowel obstruction.  This is likely due to enteritis and
could be related to radiation, infectious or inflammatory, or
ischemic.

Descending colonic diverticula are noted.  No active
diverticulitis.  Stable soft tissue within the presacral space.
There is a low density fluid collection within the left side of the
pelvis.  This measures 4.3 cm, similar to prior study.  Bladder is
grossly unremarkable.
IMPRESSION: Abnormal small bowel loops in the pelvis with wall thickening/edema
and surrounding mesenteric edema as well as free fluid.  This is
likely related to enteritis, either related to radiation,
infectious/inflammatory, or ischemia.

This causes a caliber change in the small bowel, likely low grade
small bowel obstruction.

Left lower quadrant ostomy.  Herniation of small bowel loops
through the ostomy defect, similar to prior study.

Stable postoperative changes in the pelvis with presacral soft
tissue and left pelvic fluid collection.

Mild fatty infiltration of the liver.

## 2009-11-02 IMAGING — CR DG ABDOMEN ACUTE W/ 1V CHEST
4 series · 4 of 4 positions shown · non-contrast
Comparison: Chest x-ray dated [DATE] and scout image for
abdominal CT scan dated [DATE]

CLINICAL DATA: Nausea and vomiting. History of rectal cancer.

ACUTE ABDOMEN SERIES (ABDOMEN 2 VIEW & CHEST 1 VIEW)

[w chest pa]
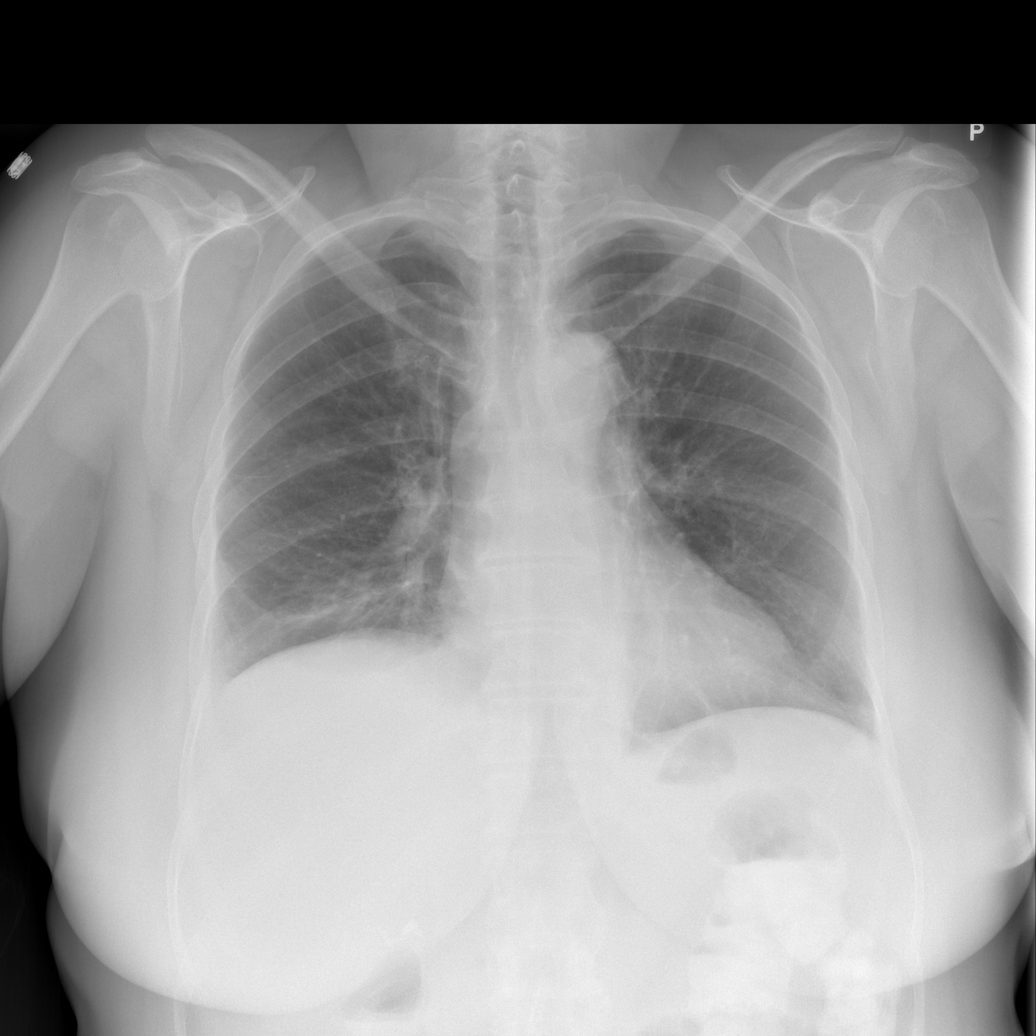

[w abdomen upright *]
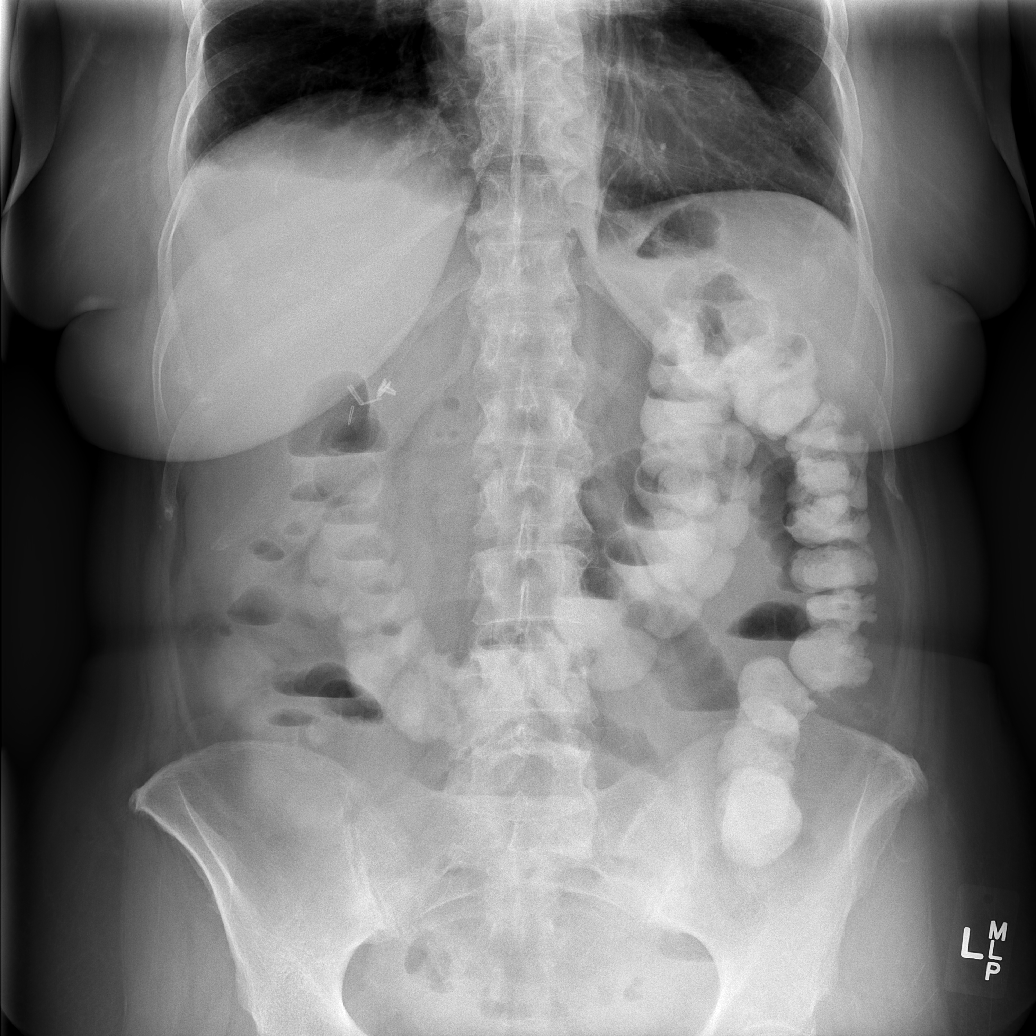

[t abdomen supine (1 of 2)]
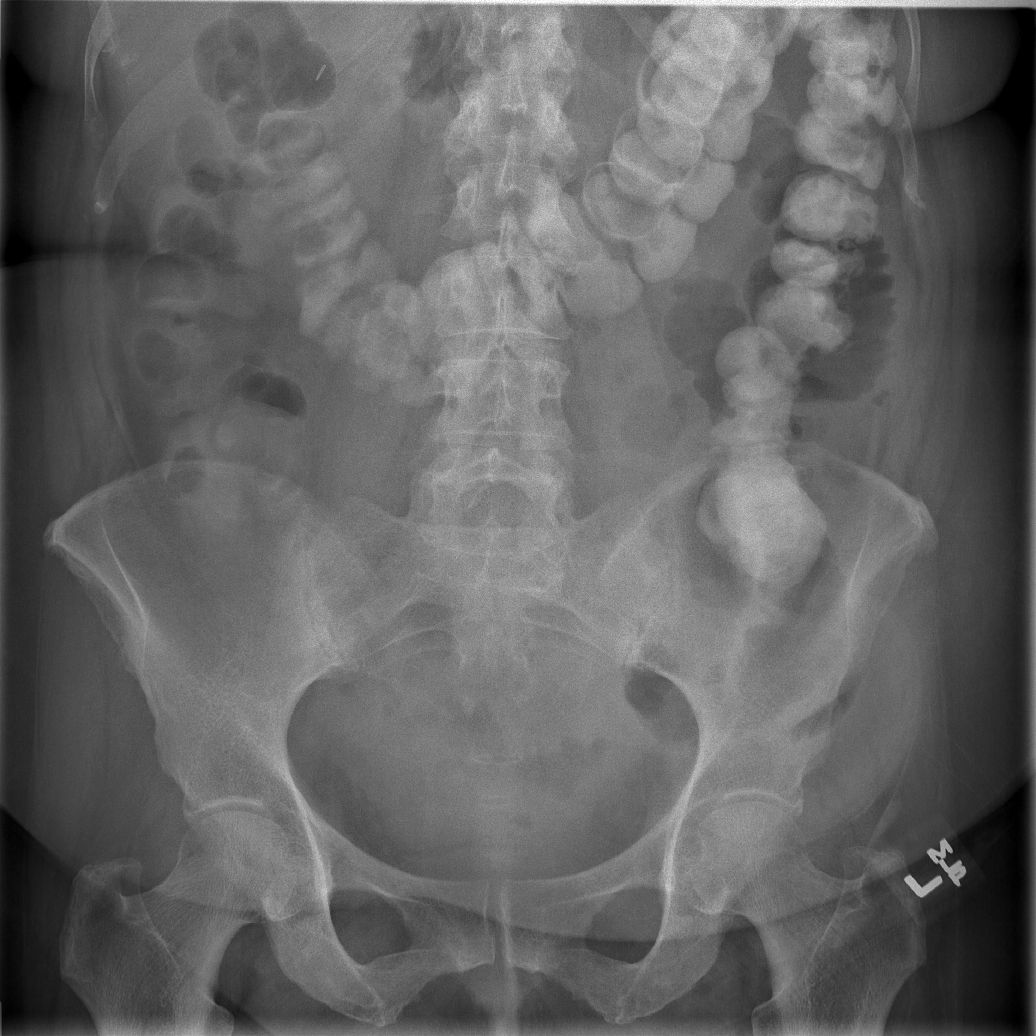

[t abdomen supine (2 of 2)]
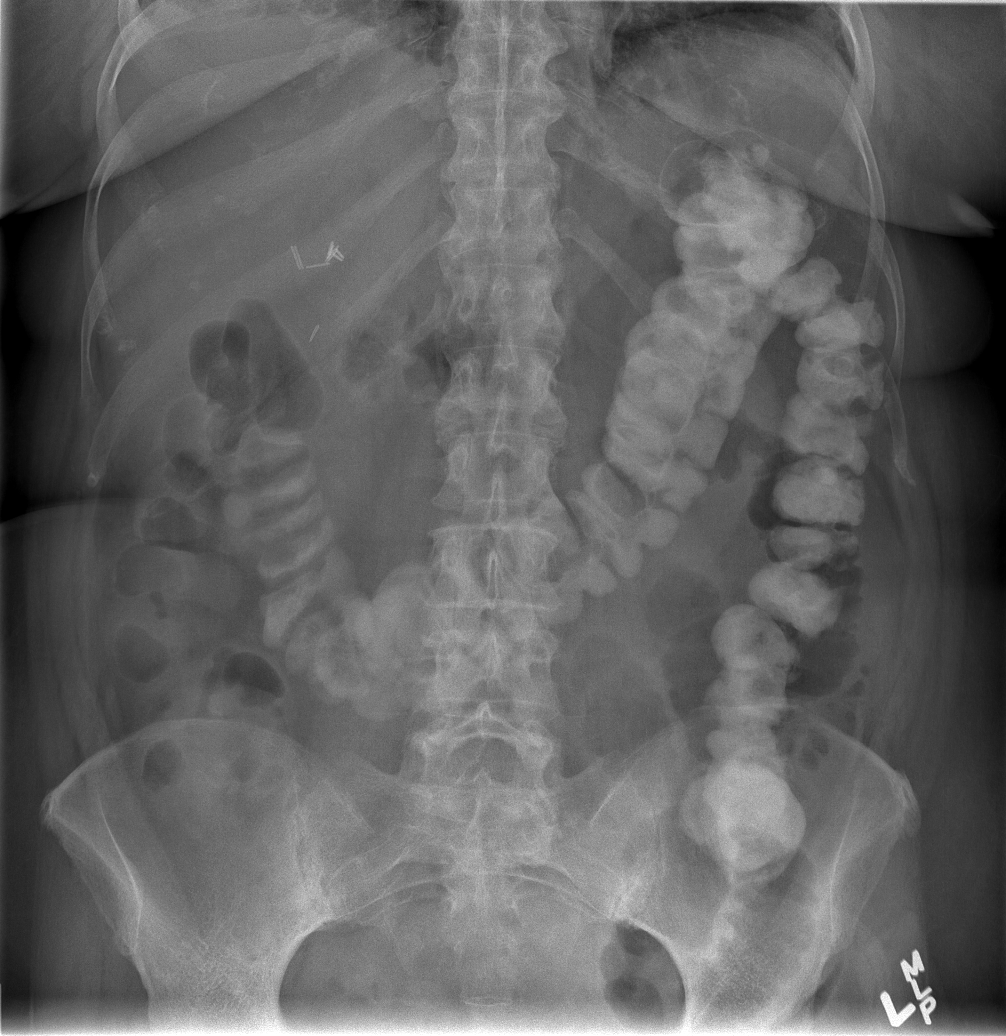

[4 of 4 positions shown; findings below may reference images not displayed]

FINDINGS: Contrast from the prior CT has passed into the
nondistended colon.  There are slightly distended small bowel loops
in the left mid abdomen.  Prior CT scan demonstrated that there is
a loop of small bowel herniated into the ostomy site in the left
lower quadrant although this does not appear to obstruct the bowel.

There is no free air in the abdomen.  There is a tiny right
effusion with mild atelectasis at the right lung base.
IMPRESSION: Minimal distention of small bowel loops.  This could be secondary
to radiation enteritis noted on the recent CT scan.  There is no
obstruction.

## 2009-11-16 ENCOUNTER — Ambulatory Visit: Payer: Self-pay | Admitting: Hematology & Oncology

## 2009-11-17 LAB — CBC WITH DIFFERENTIAL (CANCER CENTER ONLY)
BASO%: 0.5 % (ref 0.0–2.0)
EOS%: 4.6 % (ref 0.0–7.0)
Eosinophils Absolute: 0.3 10*3/uL (ref 0.0–0.5)
HCT: 35.8 % (ref 34.8–46.6)
LYMPH%: 27.7 % (ref 14.0–48.0)
MCH: 28.9 pg (ref 26.0–34.0)
Platelets: 412 10*3/uL — ABNORMAL HIGH (ref 145–400)

## 2009-11-18 LAB — COMPREHENSIVE METABOLIC PANEL
AST: 17 U/L (ref 0–37)
Albumin: 4.6 g/dL (ref 3.5–5.2)
Alkaline Phosphatase: 70 U/L (ref 39–117)
BUN: 20 mg/dL (ref 6–23)
Calcium: 9.5 mg/dL (ref 8.4–10.5)
Chloride: 97 mEq/L (ref 96–112)
Creatinine, Ser: 0.92 mg/dL (ref 0.40–1.20)
Sodium: 134 mEq/L — ABNORMAL LOW (ref 135–145)

## 2009-11-18 LAB — VITAMIN D 25 HYDROXY (VIT D DEFICIENCY, FRACTURES): Vit D, 25-Hydroxy: 20 ng/mL — ABNORMAL LOW (ref 30–89)

## 2009-11-19 ENCOUNTER — Encounter: Admission: RE | Admit: 2009-11-19 | Discharge: 2009-11-19 | Payer: Self-pay | Admitting: Hematology & Oncology

## 2010-05-29 ENCOUNTER — Encounter: Payer: Self-pay | Admitting: Hematology & Oncology

## 2010-07-24 LAB — BASIC METABOLIC PANEL
BUN: 11 mg/dL (ref 6–23)
BUN: 6 mg/dL (ref 6–23)
CO2: 27 mEq/L (ref 19–32)
CO2: 29 mEq/L (ref 19–32)
Calcium: 8.3 mg/dL — ABNORMAL LOW (ref 8.4–10.5)
Calcium: 8.5 mg/dL (ref 8.4–10.5)
Chloride: 104 mEq/L (ref 96–112)
Chloride: 98 mEq/L (ref 96–112)
Creatinine, Ser: 0.72 mg/dL (ref 0.4–1.2)
GFR calc Af Amer: >60 mL/min (ref >60)
GFR calc Af Amer: >60 mL/min (ref >60)
Glucose, Bld: 181 mg/dL — ABNORMAL HIGH (ref 70–99)
Potassium: 3.4 mEq/L — ABNORMAL LOW (ref 3.5–5.1)
Potassium: 4.6 mEq/L (ref 3.5–5.1)
Sodium: 135 mEq/L (ref 135–145)
Sodium: 138 mEq/L (ref 135–145)

## 2010-07-24 LAB — DIFFERENTIAL
Basophils Absolute: 0 10*3/uL (ref 0.0–0.1)
Basophils Relative: 0 % (ref 0–1)
Eosinophils Absolute: 0.1 10*3/uL (ref 0.0–0.7)
Eosinophils Absolute: 0.3 10*3/uL (ref 0.0–0.7)
Lymphocytes Relative: 8 % — ABNORMAL LOW (ref 12–46)
Lymphs Abs: 1.2 10*3/uL (ref 0.7–4.0)
Monocytes Relative: 6 % (ref 3–12)
Neutro Abs: 6.4 10*3/uL (ref 1.7–7.7)
Neutrophils Relative %: 74 % (ref 43–77)
Neutrophils Relative %: 83 % — ABNORMAL HIGH (ref 43–77)

## 2010-07-24 LAB — COMPREHENSIVE METABOLIC PANEL
ALT: 21 U/L (ref 0–35)
AST: 21 U/L (ref 0–37)
CO2: 24 mEq/L (ref 19–32)
Calcium: 9.2 mg/dL (ref 8.4–10.5)
Creatinine, Ser: 0.96 mg/dL (ref 0.4–1.2)
GFR calc Af Amer: >60 mL/min (ref >60)
GFR calc non Af Amer: 59 mL/min — ABNORMAL LOW (ref >60)
Glucose, Bld: 222 mg/dL — ABNORMAL HIGH (ref 70–99)
Sodium: 133 mEq/L — ABNORMAL LOW (ref 135–145)
Total Protein: 7.1 g/dL (ref 6.0–8.3)

## 2010-07-24 LAB — GLUCOSE, CAPILLARY
Glucose-Capillary: 133 mg/dL — ABNORMAL HIGH (ref 70–99)
Glucose-Capillary: 146 mg/dL — ABNORMAL HIGH (ref 70–99)
Glucose-Capillary: 149 mg/dL — ABNORMAL HIGH (ref 70–99)
Glucose-Capillary: 150 mg/dL — ABNORMAL HIGH (ref 70–99)
Glucose-Capillary: 151 mg/dL — ABNORMAL HIGH (ref 70–99)
Glucose-Capillary: 153 mg/dL — ABNORMAL HIGH (ref 70–99)
Glucose-Capillary: 154 mg/dL — ABNORMAL HIGH (ref 70–99)
Glucose-Capillary: 180 mg/dL — ABNORMAL HIGH (ref 70–99)
Glucose-Capillary: 182 mg/dL — ABNORMAL HIGH (ref 70–99)
Glucose-Capillary: 183 mg/dL — ABNORMAL HIGH (ref 70–99)
Glucose-Capillary: 189 mg/dL — ABNORMAL HIGH (ref 70–99)
Glucose-Capillary: 232 mg/dL — ABNORMAL HIGH (ref 70–99)

## 2010-07-24 LAB — CBC
HCT: 31.4 % — ABNORMAL LOW (ref 36.0–46.0)
Hemoglobin: 10.5 g/dL — ABNORMAL LOW (ref 12.0–15.0)
Hemoglobin: 11.1 g/dL — ABNORMAL LOW (ref 12.0–15.0)
Hemoglobin: 11.4 g/dL — ABNORMAL LOW (ref 12.0–15.0)
MCH: 29.2 pg (ref 26.0–34.0)
MCH: 29.2 pg (ref 26.0–34.0)
MCH: 29.4 pg (ref 26.0–34.0)
MCHC: 33.7 g/dL (ref 30.0–36.0)
MCHC: 34.4 g/dL (ref 30.0–36.0)
MCV: 86.5 fL (ref 78.0–100.0)
MCV: 87.3 fL (ref 78.0–100.0)
Platelets: 305 10*3/uL (ref 150–400)
Platelets: 313 10*3/uL (ref 150–400)
RBC: 3.6 MIL/uL — ABNORMAL LOW (ref 3.87–5.11)
RDW: 14 % (ref 11.5–15.5)
RDW: 14 % (ref 11.5–15.5)
WBC: 6.2 10*3/uL (ref 4.0–10.5)

## 2010-07-24 LAB — LIPASE, BLOOD: Lipase: 38 U/L (ref 11–59)

## 2010-07-24 LAB — URINALYSIS, ROUTINE W REFLEX MICROSCOPIC
Hgb urine dipstick: NEGATIVE
Nitrite: NEGATIVE
Protein, ur: 30 mg/dL — AB
Urobilinogen, UA: 0.2 mg/dL (ref 0.0–1.0)

## 2010-07-24 LAB — HEMOGLOBIN A1C
Hgb A1c MFr Bld: 7.3 % — ABNORMAL HIGH (ref <5.7)
Mean Plasma Glucose: 163 mg/dL — ABNORMAL HIGH (ref <117)

## 2010-07-24 LAB — URINE CULTURE: Culture: NO GROWTH

## 2010-07-24 LAB — HEMOCCULT GUIAC POC 1CARD (OFFICE): Fecal Occult Bld: NEGATIVE

## 2010-07-24 LAB — URINE MICROSCOPIC-ADD ON

## 2010-07-24 LAB — LACTIC ACID, PLASMA: Lactic Acid, Venous: 1.8 mmol/L (ref 0.5–2.2)

## 2010-08-19 ENCOUNTER — Other Ambulatory Visit: Payer: Self-pay | Admitting: Family

## 2010-08-19 ENCOUNTER — Encounter (HOSPITAL_BASED_OUTPATIENT_CLINIC_OR_DEPARTMENT_OTHER): Payer: Medicare Other | Admitting: Hematology & Oncology

## 2010-08-19 DIAGNOSIS — C20 Malignant neoplasm of rectum: Secondary | ICD-10-CM

## 2010-08-19 LAB — CBC WITH DIFFERENTIAL (CANCER CENTER ONLY)
BASO#: 0.1 10*3/uL (ref 0.0–0.2)
Eosinophils Absolute: 0.4 10*3/uL (ref 0.0–0.5)
HGB: 11.7 g/dL (ref 11.6–15.9)
MCH: 28.2 pg (ref 26.0–34.0)
MCV: 84 fL (ref 81–101)
MONO#: 0.6 10*3/uL (ref 0.1–0.9)
MONO%: 8 % (ref 0.0–13.0)
NEUT#: 3.6 10*3/uL (ref 1.5–6.5)
Platelets: 302 10*3/uL (ref 145–400)
RBC: 4.15 10*6/uL (ref 3.70–5.32)
WBC: 6.9 10*3/uL (ref 3.9–10.0)

## 2010-09-23 NOTE — H&P (Signed)
[name], INSCO NO.:  [id number]   MEDICAL RECORD NO.:  [id number]                   PATIENT TYPE:   LOCATION:                                       FACILITY:   PHYSICIAN:  [name], M.D.               DATE OF BIRTH:  Jun 17, 1945   DATE OF ADMISSION:  03/10/2002  DATE OF DISCHARGE:                                HISTORY & PHYSICAL   HISTORY OF PRESENT ILLNESS:  The patient is a 65 year old white female who  was seen for evaluation of dyspnea on exertion.  She states that  approximately six weeks ago she began experiencing symptoms of significant  shortness of breath on exertion.  It has advanced to the point where she  cannot walk across a parking lot or even towel off after a shower without  getting shortness of breath.  It is associated with symptoms of spasms in  her mid back.  The patient states that she was tried on Nexium initially and  later switched to Aciphex and her symptoms seemed to improve somewhat, but  she has had persistent symptoms.  She was tried on muscle relaxants.  She  was recently placed on a Combivent inhaler, but has noted no improvement in  her symptoms.  She has had no significant PND, orthopnea, or edema, but does  get short of breath to some extent when she first goes to bed.  She  subsequently underwent a stress Cardiolite study on February 27, 2002.  She  was able to exercise for 5 minutes and 45 seconds, developing symptoms of  dyspnea and mid substernal chest pain.  She was noted to have frequent PVCs,  but no diagnostic ST segment changes.  Cardiolite images suggested  inferobasal ischemia.  For this reason, cardiac catheterization has been  recommended.   PAST MEDICAL HISTORY:  Significant for hiatal hernia with acid reflux  disease.   PAST SURGICAL HISTORY:  She has had previous cholecystectomy, appendectomy,  tonsillectomy, adenoidectomy, and oophorectomy.   ALLERGIES:  She has no known  allergies.   CURRENT MEDICATIONS:  1. Synthroid 0.15 mg daily.  2. Aciphex 20 mg b.i.d.  3. Wellbutrin 150 mg per day.   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  She does not exercise.  Her daughter is the Print production planner at [employer].  The patient has been a smoker for 25 years of at least one pack  per day, but is trying to cut down and quit.  She denies alcohol use.   FAMILY HISTORY:  Her father is age 70 and has had previous myocardial  infarction and pacemaker implant, as well as valve surgery.  Her mother is  age 97 and in good health.  One brother is age 69 and in good health.   REVIEW OF SYMPTOMS:  The patient has no history  of TIA or stroke.  No recent  bowel or bladder complaints.  All other review of systems is negative.   PHYSICAL EXAMINATION:  GENERAL APPEARANCE:  The patient is a pleasant,  obese, white female in no apparent distress.  WEIGHT:  183 pounds.  VITAL SIGNS:  The blood pressure is 142/100 and pulse 84 and regular.  HEENT:  Unremarkable.  NECK:  She has no JVD or bruits.  LUNGS:  Clear.  CARDIAC:  Regular rate and rhythm.  Normal S1 and S2 without gallops,  murmurs, or clicks.  ABDOMEN:  Soft and nontender.  She has no hepatosplenomegaly, masses, or  bruits.  EXTREMITIES:  Femoral and pedal pulses are 2+ and symmetric.  NEUROLOGIC:  Exam is nonfocal.   LABORATORY DATA:  The ECG shows a normal sinus rhythm with T-wave flattening  laterally.  Prior pulmonary function tests showed mild obstructive and  restrictive disease.  A previous echocardiogram showed an estimated normal  left ventricular function, although the endocardium was poorly visualized.  There were no significant valvular abnormalities.  The chest x-ray showed  COPD without active disease.   IMPRESSION:  1. Dyspnea on exertion and chest pain.  A stress Cardiolite is suggestive of     inferobasal ischemia.  2. Gastrointestinal reflux  disease.  3. Chronic obstructive pulmonary disease with chronic tobacco abuse.  4. Combined hyperlipidemia.  5. Obesity.   PLAN:  The patient is will be admitted for cardiac catheterization with  further therapy pending these results.                                               [name], M.D.    PMJ/MEDQ  D:  03/03/2002  T:  03/03/2002  Job:  [id number]   cc:   Tama Headings. Marina Goodell, M.D.   John C. Gina Villarreal Fireman, M.D.  [address]  [place]  [place] [address]  Fax: [phone]

## 2010-09-23 NOTE — Discharge Summary (Signed)
NAMELASHARN, BUFKIN                ACCOUNT NO.:  [id number]   MEDICAL RECORD NO.:  [id number]          PATIENT TYPE:  INP   LOCATION:  0442                         FACILITY:  [facility]   PHYSICIAN:  Abigail Miyamoto, M.D. DATE OF BIRTH:  Aug 08, 1945   DATE OF ADMISSION:  01/15/2004  DATE OF DISCHARGE:  01/22/2004                                 DISCHARGE SUMMARY   DISCHARGE DIAGNOSES:  Recurrent rectal cancer, status post abdominoperineal  resection. Other diagnoses include history of coronary artery disease,  history of hypertension, and history of reflux.   SUMMARY OF HISTORY:  Ardys Hataway is a 65 year old female who has been  treated before with local resection for a rectal cancer and undergone  chemotherapy and radiation postoperatively. She is now one year out and has  recurred on biopsy. Therefore, decision has been made after discussing with  the oncologist to proceed with the abdominoperineal resection.   HOSPITAL COURSE:  The patient was admitted on January 15, 2004, and taken  to the operating room where she underwent an abdominoperineal resection with  two drains left in the perianal area. She tolerated the procedure well and  was taken in stable condition to a regular surgical floor. Over the next  several days she did have some fevers which were felt to be secondary to  atelectasis. A pulmonary toilet was instituted. On postoperative day three  she was still having some perianal discomfort, but continued to do well. Her  ostomy was mildly dusky, but was becoming productive and her drains were  draining serosanguineous fluid. She began ambulating better and by  postoperative day five she was tolerating liquids. There was some gaseous  stool in the bag and drains were still draining serosanguineous fluid. At  this point one of the drains was removed and her diet was advanced. By  postoperative day seven, she was doing quite well, tolerating a regular  diet, and on oral pain  medications. Her ostomy was working well and the  ostomy itself was viable and well perfused. Her drains were draining  serosanguineous fluid in the perineum. Decision was made to discharge her  home with drain in place.   DISCHARGE MEDICATIONS:  She is  to resume her home medications. She will  take Advil, Tylenol, and Percocet for pain.   DISCHARGE ACTIVITY:  She should do no heavy lifting greater than 20 pounds  for the next six weeks.   WOUND CARE:  She may shower. Home health has been arranged for ostomy care  as well as drain care.   FOLLOWUP:  She will follow up in my office next Tuesday.      DB/MEDQ  D:  02/16/2004  T:  02/16/2004  Job:  [id number]

## 2010-09-23 NOTE — Op Note (Signed)
NAME:  Gina Villarreal, Gina Villarreal                          ACCOUNT NO.:  [id number]   MEDICAL RECORD NO.:  [id number]                   PATIENT TYPE:  INP   LOCATION:  0442                                 FACILITY:  [facility]   PHYSICIAN:  Abigail Miyamoto, M.D.              DATE OF BIRTH:  May 15, 1945   DATE OF PROCEDURE:  01/15/2004  DATE OF DISCHARGE:                                 OPERATIVE REPORT   PREOPERATIVE DIAGNOSIS:  Recurrent rectal cancer.   POSTOPERATIVE DIAGNOSIS:  Recurrent rectal cancer.   PROCEDURE:  Abdominoperineal resection.   SURGEON:  Abigail Miyamoto, M.D.   ASSISTANT:  Currie Paris, M.D.   ANESTHESIA:  General endotracheal anesthesia.   ESTIMATED BLOOD LOSS:  700 cc.   DRAINS:  Two 39 French Villarreal drains.   INDICATIONS:  Gina Villarreal is a 65 year old female who in July 2004 underwent  transanal excision of a rectal cancer, which was a T2 lesion.  She then  underwent postoperative chemotherapy and radiation; however, this year in  August, 2005 was found on endoscopy to have recurrence of the resected area,  approximately  4 cm from the anal verge.  CAT scan performed and showed no adenopathy or  any evidence of intestine disease.  Again, the findings were recurrent,  despite therapy.  The decision was made to proceed with an abdominoperineal  resection.   FINDINGS:  Patient was found to have an abdomen that was free of any  metastatic disease.  The pelvis was rather fixed and fibrotic from the  radiation changes, but the specimen could still be resected in full.   PROCEDURE IN DETAIL:  Patient was brought to the operating room and  identified as Gina Villarreal.  She was placed supine on the operating room  table, and general anesthesia was induced.  Her abdomen was then prepped and  draped in the usual sterile fashion after she placed in the lithotomy  position.  The patient's previous midline incision was then opened up with a  #10 blade.  The incision was  carried down through the fascia with  electrocautery.  The peritoneum was then opened the entire length of the  incision.  Upon entering the abdomen, the patient was found to have some  omentum stuck to the abdominal wall, and this was taken down easily.  The  small bowel was then examined as well as the liver, and both were found to  be free of disease.  The sigmoid colon was also quite mobile down to the  rectum and appeared normal down to the peritoneal reflection.  The sigmoid  colon was then mobilized along the white line of Toldt and freed up with the  electrocautery.  An area of distal sigmoid was then identified and  transected to the GIA-75 stapler.  The mesentery was then taken down with  Gina Villarreal clamps and 2-0 silk ties, moving towards the mesorectum.  The complete  mesorectal resection was then performed on the distal segment of bowel,  including the rectum.  Dissected down the mesentery, staying close to the  sacrum.  This was done with Gina Villarreal clamps as well as 2-0 Gina Villarreal silk ties as  well as the harmonic scalpel.  The attachments were taken down posterior,  and then we worked circumferentially and anterior.  The anterior segment  close to the vagina was found to be quite fibrotic and difficult to dissect  out; however, the posterior aspect was easily resected.  Care was taken to  stay out of the sacral plexus.  As we continued to dissect posterior with a  harmonic scalpel, the rectum became more mobile.  The lateral attachments  were taken down easily as well, just leaving the hard fibrotic area  anteriorly.  Once the rectal stump was mobilized to its maximum extent, the  decision was turned towards the anal approach.  The anus was closed  circumferentially with a 2-0 purse-string silk suture.  The electrocautery  was then used to dissect out the anus circumferentially at the skin level.  The dissection was carried into the sphincter complex circumferentially with   electrocautery.  Once this was done, the levators were reached and dissected  out circumferentially with electrocautery.  I continued to move posterior,  which was the easier part of the dissection, staying in the planes with the  electrocautery.  The anterior segment stayed quite fibrotic.  In order to  stay out of the rectum, the vagina was finally entered in the most proximal  aspect of the vagina.  This could not be avoided secondary to fibrosis.  Finally, posteriorly, the dissection was carried into the pelvis of the  previously dissected planes.  This allowed the proximal end of the rectum to  be delivered out of the hole in the anus.  This then allowed me to dissect  further with the electrocautery freeing up the rest of the hard, fibrotic  attachments to the anterior wall of the rectum and anus.  The entire  specimen was finally delivered, and all of the rest of the attachments were  taken down with the cautery.  The specimen was examined and found to be  intact.  At this point, the hole in the vagina was examined, and two  separate holes were identified.  It was difficult to reapproximate the more  proximal hole up in the vagina.  This was done with running 2-0 Gina Villarreal  sutures and interrupted 2-0 Gina Villarreal sutures.  The more proximal hole was then  closed also with the 2-0 Gina Villarreal sutures.  Two separate skin incisions were  made, and two 19 Gina Villarreal drains were placed through the perineum and  into the deep pelvis.  These were sewn in place with nylon sutures.  The  perineum was then closed in layers with figure-of-eight 2-0 Gina Villarreal sutures.  The skin was then closed with interrupted 3-0 nylon sutures.  I then re-  scrubbed and came back to the abdominal portion of the procedure.  The Villarreal  drains were examined and found to be in perfect place in the pelvis.  The  uterus and peritoneum were reapproximated in order to close a layer over the deep dissected plane of the pelvis.   Next, we turned our attention to the  colostomy.  The proximal sigmoid colon was mobilized further along the white  line of Toldt.  An area in the lateral flank was then identified, and a  circular incision  was created with the scalpel after grasping the skin with  a Kocher clamp.  A plug of fat was then removed in the subcutaneous tissue,  and then the rectus fascia was identified and opened in a cruciate fashion.  The muscle fibers were dissected free and retracted medially and laterally,  allowing [place] to visualize the peritoneum once this was then opened.  The  proximal end of the colon was then pulled out of this as the colostomy.  It  was secured internally to the peritoneum with interrupted 3-0 silk sutures.  The abdomen was copiously irrigated with several liters of normal saline.  Again, hemostasis appeared to be achieved.  The midline fascia was then  closed with a running #1 PDS suture.  The skin was then irrigated and closed  with skin staples.  The distal end of the sigmoid colon was then opened up  along each staple line with the electrocautery.  The colostomy was then  matured circumferentially with 3-0 Gina Villarreal sutures.  A well-perfused ostomy  appeared to be achieved.  An ostomy appliance was then placed over this.  Gauze was then placed over the rest of the incision.  The patient tolerated  the procedure well.  All counts were correct at the end of the procedure.  The patient was then extubated in the operating room and taken in a stable  condition to the recovery room.                                               Abigail Miyamoto, M.D.    DB/MEDQ  D:  01/15/2004  T:  01/16/2004  Job:  [id number]   cc:   Rose Phi. Myna Hidalgo, M.D.  [address] [facility]  [place], [place] [address]  Fax: [phone]

## 2010-09-23 NOTE — Cardiovascular Report (Signed)
NAME:  Gina Villarreal, CHENOWETH                          ACCOUNT NO.:  [id number]   MEDICAL RECORD NO.:  [id number]                   PATIENT TYPE:  OIB   LOCATION:  6533                                 FACILITY:  [facility]   PHYSICIAN:  [name], M.D.               DATE OF BIRTH:  1945/08/08   DATE OF PROCEDURE:  03/10/2002  DATE OF DISCHARGE:                              CARDIAC CATHETERIZATION   INDICATIONS FOR PROCEDURE:  A 65 year old female presents with progressive  dyspnea on exertion.  A stress Cardiolite study indicates evidence of  inferior basal ischemia.  The patient has a history of hyperlipidemia.   ACCESS:  Via the right femoral artery using standard Seldinger technique.   EMERGENCY DEPARTMENT COURSE:  The #6 French 4-cm right and left Judkins  catheters, #6 French pigtail catheter, #6 [place] arterial sheath, #6 [place]  JR4 guide with sideholes, a 0.014 Hi-Torque Floppy wire, a 2.5 x 15-mm  CrossSail balloon, and a 2.75 x 15-mm Zeta stent.   CONTRAST:  Omnipaque, 170 cc.   MEDICATIONS:  Nitroglycerin 200 mcg intracoronary x3, heparin 4000 units IV  with subsequent ACT of 268, Integrilin double bolus at 0.18 mcg/kg per  minute followed by continuous infusion at 2 mcg/kg per minute, Plavix 300 mg  p.o.   HEMODYNAMIC DATA:  1. Aortic pressure was 170/93 with a mean of 122.  2. Left ventricular pressure was 185 with an EDP of 16 mmHg.  3. There was no significant aortic valve gradient by pullback.   ANGIOGRAPHIC DATA:  1. The left coronary artery arises and distributes normally.  2. The left main coronary artery is normal.  3. The left anterior descending artery is mildly calcified.  There is     diffuse narrowing of the mid LAD up to 30%.  4. The left circumflex coronary artery has only minor wall irregularities,     less than 10%.  5. The right coronary artery arises and distributes normally.  There is a     focal 95% stenosis in the proximal right coronary  artery.  6. Left ventricular angiography is performed in the RAO view.  This     demonstrates moderate inferobasal hypokinesia with overall normal left     ventricular systolic function.  Ejection fraction is estimated at 60%.     There is no mitral regurgitation or prolapse.   We proceeded at this point with intervention of the right coronary artery.  This lesion was easily crossed and predilated with a 2.5-mm balloon,  dilating up to 8 atmospheres.  We then implanted the 2.75 x 15-mm Zeta  stent, deploying it at 8 atmospheres and then post dilating it to 16  atmospheres.  This yielded an excellent angiographic result with less than  0% residual stenosis and TIMI grade 3 flow.  The patient tolerated the  procedure well without complications.    FINAL INTERPRETATION:  1. Single-vessel  obstructive atherosclerotic coronary artery disease.  2. Overall normal left ventricular systolic function.  3. Successful stenting of the proximal right coronary artery.                                               [name], M.D.    PMJ/MEDQ  D:  03/10/2002  T:  03/10/2002  Job:  [id number]   cc:   Tama Headings. Marina Goodell, M.D.   John C. Madilyn Fireman, M.D.  [address]  [place]  [place] [address]  Fax: [phone]

## 2011-01-18 ENCOUNTER — Other Ambulatory Visit: Payer: Self-pay | Admitting: Internal Medicine

## 2011-01-18 DIAGNOSIS — Z1231 Encounter for screening mammogram for malignant neoplasm of breast: Secondary | ICD-10-CM

## 2011-02-07 ENCOUNTER — Ambulatory Visit
Admission: RE | Admit: 2011-02-07 | Discharge: 2011-02-07 | Disposition: A | Discharge status: Home / Self Care | Payer: No Typology Code available for payment source | Source: Ambulatory Visit | Attending: Internal Medicine | Admitting: Internal Medicine

## 2011-02-07 DIAGNOSIS — Z1231 Encounter for screening mammogram for malignant neoplasm of breast: Secondary | ICD-10-CM

## 2011-06-21 DIAGNOSIS — E785 Hyperlipidemia, unspecified: Secondary | ICD-10-CM | POA: Diagnosis not present

## 2011-06-21 DIAGNOSIS — E039 Hypothyroidism, unspecified: Secondary | ICD-10-CM | POA: Diagnosis not present

## 2011-06-21 DIAGNOSIS — I10 Essential (primary) hypertension: Secondary | ICD-10-CM | POA: Diagnosis not present

## 2011-06-21 DIAGNOSIS — R350 Frequency of micturition: Secondary | ICD-10-CM | POA: Diagnosis not present

## 2011-07-18 DIAGNOSIS — J40 Bronchitis, not specified as acute or chronic: Secondary | ICD-10-CM | POA: Diagnosis not present

## 2011-08-10 ENCOUNTER — Telehealth: Payer: Self-pay | Admitting: Hematology & Oncology

## 2011-08-10 NOTE — Telephone Encounter (Signed)
Left pt message moved 4-19 to 4-15

## 2011-08-14 ENCOUNTER — Telehealth: Payer: Self-pay | Admitting: Hematology & Oncology

## 2011-08-14 NOTE — Telephone Encounter (Signed)
Received message from [place]. Per her message rescheduled appointment to 5-13 and left message on machine.

## 2011-08-21 ENCOUNTER — Ambulatory Visit: Payer: Medicare Other | Admitting: Hematology & Oncology

## 2011-08-21 ENCOUNTER — Other Ambulatory Visit: Payer: Medicare Other | Admitting: Lab

## 2011-08-25 ENCOUNTER — Other Ambulatory Visit: Payer: Medicare Other | Admitting: Lab

## 2011-08-25 ENCOUNTER — Ambulatory Visit: Payer: Medicare Other | Admitting: Hematology & Oncology

## 2011-09-13 DIAGNOSIS — D1801 Hemangioma of skin and subcutaneous tissue: Secondary | ICD-10-CM | POA: Diagnosis not present

## 2011-09-13 DIAGNOSIS — D235 Other benign neoplasm of skin of trunk: Secondary | ICD-10-CM | POA: Diagnosis not present

## 2011-09-13 DIAGNOSIS — Z85820 Personal history of malignant melanoma of skin: Secondary | ICD-10-CM | POA: Diagnosis not present

## 2011-09-18 ENCOUNTER — Ambulatory Visit (HOSPITAL_BASED_OUTPATIENT_CLINIC_OR_DEPARTMENT_OTHER): Payer: Medicare Other | Admitting: Hematology & Oncology

## 2011-09-18 ENCOUNTER — Other Ambulatory Visit (HOSPITAL_BASED_OUTPATIENT_CLINIC_OR_DEPARTMENT_OTHER): Payer: Medicare Other | Admitting: Lab

## 2011-09-18 VITALS — BP 109/73 | HR 78 | Temp 97.6°F | Ht 66.0 in | Wt 186.0 lb

## 2011-09-18 DIAGNOSIS — E559 Vitamin D deficiency, unspecified: Secondary | ICD-10-CM | POA: Diagnosis not present

## 2011-09-18 DIAGNOSIS — C20 Malignant neoplasm of rectum: Secondary | ICD-10-CM | POA: Diagnosis not present

## 2011-09-18 DIAGNOSIS — D649 Anemia, unspecified: Secondary | ICD-10-CM

## 2011-09-18 LAB — CBC WITH DIFFERENTIAL (CANCER CENTER ONLY)
BASO%: 0.4 % (ref 0.0–2.0)
EOS%: 4.9 % (ref 0.0–7.0)
HCT: 34.5 % — ABNORMAL LOW (ref 34.8–46.6)
LYMPH#: 2 10*3/uL (ref 0.9–3.3)
MCHC: 33 g/dL (ref 32.0–36.0)
NEUT%: 66.4 % (ref 39.6–80.0)
Platelets: 318 10*3/uL (ref 145–400)
RDW: 13.2 % (ref 11.1–15.7)

## 2011-09-18 NOTE — Progress Notes (Signed)
This office note has been dictated.

## 2011-09-19 LAB — COMPREHENSIVE METABOLIC PANEL
Albumin: 4.3 g/dL (ref 3.5–5.2)
BUN: 18 mg/dL (ref 6–23)
CO2: 27 mEq/L (ref 19–32)
Calcium: 9.2 mg/dL (ref 8.4–10.5)
Chloride: 99 mEq/L (ref 96–112)
Glucose, Bld: 186 mg/dL — ABNORMAL HIGH (ref 70–99)
Potassium: 4.5 mEq/L (ref 3.5–5.3)

## 2011-09-19 LAB — LACTATE DEHYDROGENASE: LDH: 106 U/L (ref 94–250)

## 2011-09-19 LAB — VITAMIN D 25 HYDROXY (VIT D DEFICIENCY, FRACTURES): Vit D, 25-Hydroxy: 24 ng/mL — ABNORMAL LOW (ref 30–89)

## 2011-09-19 LAB — CEA: CEA: 1.2 ng/mL (ref 0.0–5.0)

## 2011-09-19 NOTE — Progress Notes (Signed)
CC:   Frederick A. Worthy Rancher, M.D. John C. Madilyn Fireman, M.D.  DIAGNOSIS:  History of locally recurrent adenocarcinoma of the rectum.  CURRENT THERAPY:  Observation.  INTERIM HISTORY:  Ms. Quast comes in for followup.  We see her yearly. She is doing well.  She just got back from [place] I think a few weeks ago.  She had a good time over there.  She said it was cold while she was over there.  She had moved her mother to a new place.  This is along the [place].  Ms. Erkkila is doing okay.  She did have a recent bronchitis episode.  She was put on antibiotics.  This did cause some diarrhea.  She has a colostomy which she says is __________because of the diarrhea.  She has had no problems with rashes.  There has been no leg swelling.  We did do her CEA level today.  It was 1.2.  Her last mammogram was done back in July of 2012.  Everything looked okay on the mammogram without any problems with breast cancer.  Her mammogram (sic) was done in October.  PHYSICAL EXAMINATION:  This is a well-developed, well-nourished white female in no obvious distress.  Vital signs were done and showed temperature 97.5, pulse 78, respiratory rate 22, blood pressure 109/73. Weight is 186.  Head and neck exam shows a normocephalic, atraumatic skull.  There are no ocular or oral lesions.  There are no palpable cervical or supraclavicular lymph nodes.  Lungs are clear to percussion and auscultation bilaterally.  Cardiac regular rate and rhythm with a normal S1, S2.  No murmurs, rubs or bruits.  Abdomen soft with good bowel sounds.  She has a colostomy in the left lower quadrant.  There is no fluid wave.  There is no guarding or rebound or tenderness.  There is no palpable hepatosplenomegaly.  On back exam no tenderness over the spine, ribs or hips.  Extremities show no clubbing, cyanosis or edema.  LABORATORY STUDIES:  White cell count is 9.4, hemoglobin 11.4, hematocrit 34.5, platelet count  318.  Vitamin D is 24.  LDH is 106.  BUN and creatinine are 18 and 0.9.  IMPRESSION:  Ms. Atilano is a 66 year old white female with a past history of locally recurrent adenocarcinoma of the rectum.  This was treated with surgery.  She has been out from the recurrence now for 8-1/2 years.  I am a little worried about the anemia.  Her hemoglobin has gone down gradually over the past 2 years.  There is no obvious bleeding.  I want to see her back in 3 months for followup.  I want to make sure that we are not overlooking anything that might be causing this anemia.  I will check the routine anemia studies on her when we see her back.  I do not suspect that we need to do a bone marrow biopsy, however.    ______________________________ Josph Macho, M.D. PRE/MEDQ  D:  09/19/2011  T:  09/19/2011  Job:  [id number]

## 2011-09-28 ENCOUNTER — Telehealth: Payer: Self-pay | Admitting: *Deleted

## 2011-09-28 NOTE — Telephone Encounter (Signed)
Message copied by Anselm Jungling on Thu Sep 28, 2011 11:57 AM ------      Message from: Arlan Organ R      Created: Mon Sep 25, 2011  6:20 PM       Call - Vit D is low!!!  How much is she taking??  Need to double this!!!  pete

## 2011-09-28 NOTE — Telephone Encounter (Signed)
Called patient and left voicemail that her vitamin d level is low per dr. Lupita Leash. Encouraged the patient to call us back to let us know what the dosage of vitamin d is that she is taking.

## 2011-09-29 ENCOUNTER — Telehealth: Payer: Self-pay | Admitting: *Deleted

## 2011-09-29 NOTE — Telephone Encounter (Addendum)
Message copied by Mirian Capuchin on Fri Sep 29, 2011 11:18 AM ------      Message from: Arlan Organ R      Created: Mon Sep 25, 2011  6:20 PM       Call - Vit D is low!!!  How much is she taking??  Need to double this!!!  Cindee Lame This message left on pt's home answering machine.

## 2011-10-06 ENCOUNTER — Encounter: Payer: Self-pay | Admitting: Cardiology

## 2011-11-02 DIAGNOSIS — I10 Essential (primary) hypertension: Secondary | ICD-10-CM | POA: Diagnosis not present

## 2011-11-02 DIAGNOSIS — E785 Hyperlipidemia, unspecified: Secondary | ICD-10-CM | POA: Diagnosis not present

## 2011-11-02 DIAGNOSIS — E039 Hypothyroidism, unspecified: Secondary | ICD-10-CM | POA: Diagnosis not present

## 2011-12-20 ENCOUNTER — Ambulatory Visit (HOSPITAL_BASED_OUTPATIENT_CLINIC_OR_DEPARTMENT_OTHER): Payer: Medicare Other | Admitting: Hematology & Oncology

## 2011-12-20 ENCOUNTER — Other Ambulatory Visit (HOSPITAL_BASED_OUTPATIENT_CLINIC_OR_DEPARTMENT_OTHER): Payer: Medicare Other | Admitting: Lab

## 2011-12-20 VITALS — BP 121/73 | HR 70 | Temp 97.0°F | Resp 18 | Ht 66.0 in | Wt 186.0 lb

## 2011-12-20 DIAGNOSIS — D649 Anemia, unspecified: Secondary | ICD-10-CM

## 2011-12-20 DIAGNOSIS — C20 Malignant neoplasm of rectum: Secondary | ICD-10-CM | POA: Diagnosis not present

## 2011-12-20 LAB — CBC WITH DIFFERENTIAL (CANCER CENTER ONLY)
BASO#: 0.1 10*3/uL (ref 0.0–0.2)
BASO%: 0.8 % (ref 0.0–2.0)
EOS%: 5.7 % (ref 0.0–7.0)
HGB: 12.3 g/dL (ref 11.6–15.9)
LYMPH#: 2.1 10*3/uL (ref 0.9–3.3)
MCHC: 33.7 g/dL (ref 32.0–36.0)
NEUT#: 4.1 10*3/uL (ref 1.5–6.5)
RDW: 13.4 % (ref 11.1–15.7)

## 2011-12-20 NOTE — Progress Notes (Signed)
This office note has been dictated.

## 2011-12-21 NOTE — Progress Notes (Signed)
CC:   John C. Madilyn Fireman, M.D. Frederick A. Worthy Rancher, M.D.  DIAGNOSES: 1. Locally recurrent adenocarcinoma of the rectum. 2. Transient anemia.  CURRENT THERAPY:  Observation.  INTERIM HISTORY:  Gina Villarreal comes in for followup.  I actually saw her back in May.  I was a little worried with her anemia.  I just wanted her to come back to follow up with the anemia.  She is feeling well.  She has had no problems with obvious bleeding.  She has a colostomy that is working well.  When we did see her back in May, her vitamin D level was a little on the low side at 24.  She has had a good appetite.  She has had no weight loss or weight gain. There are no fevers.  There have been no rashes.  PHYSICAL EXAMINATION:  General:  This is a well-developed, well- nourished white female in no obvious distress.  Vital Signs:  Show a temperature of 97, pulse 70, respiratory rate 18, blood pressure 121/73, weight is 186.  Head and Neck Exam:  Shows a normocephalic, atraumatic skull.  There are no ocular or oral lesions.  There are no palpable cervical or supraclavicular lymph nodes.  Lungs:  Clear bilaterally. Cardiac Exam:  Regular rate and rhythm with a normal S1 and S2.  There are no murmurs, rubs, or bruits.  Abdominal Exam:  Soft with good bowel sounds.  There is no palpable abdominal mass.  Her colostomy is in the left lower quadrant.  There is no fluid wave.  There is no palpable hepatosplenomegaly.  Extremities:  Show no clubbing, cyanosis, or edema. She has good range motion of her joints.  Neurological Exam:  Shows no focal neurological deficits.  LABORATORY STUDIES:  White cell count is 7.4, hemoglobin 12.3, hematocrit 36.5, platelet count 318.  MCV is 87.  Peripheral smear shows a normochromic, normocytic population of red blood cells.  There are no nucleated red blood cells.  I see no teardrop cells.  She has no rouleaux formation.  There are no spherocytes or schistocytes.  White cells  appear normal in morphology and maturation. She may have a couple of large lymphocytes.  There are no hypersegmented polys.  There are no blasts.  Platelets are adequate in number and size.  IMPRESSION:  Gina Villarreal is a 66 year old white female with locally recurrent adenocarcinoma of the rectum.  This is really not an issue for [place].  She has not had recurrence now for about close to 9 years.  I am not sure why this anemia was present back in May.  She had gotten back from [place].  She had a little bit of bronchitis.  I suppose that she may have had a little bit of hemolysis at the time.  I think we can get her back in 1 year now.  I do not think we need any blood work in between visits.    ______________________________ Josph Macho, M.D. PRE/MEDQ  D:  12/20/2011  T:  12/21/2011  Job:  [id number]

## 2011-12-22 LAB — PROTEIN ELECTROPHORESIS, SERUM, WITH REFLEX
Alpha-1-Globulin: 4.8 % (ref 2.9–4.9)
Alpha-2-Globulin: 11.9 % — ABNORMAL HIGH (ref 7.1–11.8)
Beta 2: 5.9 % (ref 3.2–6.5)
Beta Globulin: 6.8 % (ref 4.7–7.2)
Gamma Globulin: 10.7 % — ABNORMAL LOW (ref 11.1–18.8)

## 2011-12-22 LAB — FERRITIN: Ferritin: 30 ng/mL (ref 10–291)

## 2011-12-22 LAB — ERYTHROPOIETIN: Erythropoietin: 9.3 m[IU]/mL (ref 2.6–34.0)

## 2012-02-08 DIAGNOSIS — H2510 Age-related nuclear cataract, unspecified eye: Secondary | ICD-10-CM | POA: Diagnosis not present

## 2012-02-08 DIAGNOSIS — E119 Type 2 diabetes mellitus without complications: Secondary | ICD-10-CM | POA: Diagnosis not present

## 2012-02-08 DIAGNOSIS — E11329 Type 2 diabetes mellitus with mild nonproliferative diabetic retinopathy without macular edema: Secondary | ICD-10-CM | POA: Diagnosis not present

## 2012-02-08 DIAGNOSIS — H04129 Dry eye syndrome of unspecified lacrimal gland: Secondary | ICD-10-CM | POA: Diagnosis not present

## 2012-03-01 ENCOUNTER — Other Ambulatory Visit: Payer: Self-pay | Admitting: Family Medicine

## 2012-03-01 DIAGNOSIS — Z1231 Encounter for screening mammogram for malignant neoplasm of breast: Secondary | ICD-10-CM

## 2012-03-13 DIAGNOSIS — D235 Other benign neoplasm of skin of trunk: Secondary | ICD-10-CM | POA: Diagnosis not present

## 2012-03-13 DIAGNOSIS — Z85820 Personal history of malignant melanoma of skin: Secondary | ICD-10-CM | POA: Diagnosis not present

## 2012-04-10 ENCOUNTER — Ambulatory Visit
Admission: RE | Admit: 2012-04-10 | Discharge: 2012-04-10 | Disposition: A | Discharge status: Home / Self Care | Payer: Medicare Other | Source: Ambulatory Visit | Attending: Family Medicine | Admitting: Family Medicine

## 2012-04-10 DIAGNOSIS — Z1231 Encounter for screening mammogram for malignant neoplasm of breast: Secondary | ICD-10-CM | POA: Diagnosis not present

## 2012-05-17 DIAGNOSIS — J209 Acute bronchitis, unspecified: Secondary | ICD-10-CM | POA: Diagnosis not present

## 2012-07-17 DIAGNOSIS — I10 Essential (primary) hypertension: Secondary | ICD-10-CM | POA: Diagnosis not present

## 2012-07-17 DIAGNOSIS — E039 Hypothyroidism, unspecified: Secondary | ICD-10-CM | POA: Diagnosis not present

## 2012-08-07 DIAGNOSIS — E11359 Type 2 diabetes mellitus with proliferative diabetic retinopathy without macular edema: Secondary | ICD-10-CM | POA: Diagnosis not present

## 2012-08-07 DIAGNOSIS — E119 Type 2 diabetes mellitus without complications: Secondary | ICD-10-CM | POA: Diagnosis not present

## 2012-09-11 DIAGNOSIS — D235 Other benign neoplasm of skin of trunk: Secondary | ICD-10-CM | POA: Diagnosis not present

## 2012-09-11 DIAGNOSIS — Z85820 Personal history of malignant melanoma of skin: Secondary | ICD-10-CM | POA: Diagnosis not present

## 2012-09-12 DIAGNOSIS — N951 Menopausal and female climacteric states: Secondary | ICD-10-CM | POA: Diagnosis not present

## 2012-09-18 DIAGNOSIS — E1139 Type 2 diabetes mellitus with other diabetic ophthalmic complication: Secondary | ICD-10-CM | POA: Diagnosis not present

## 2012-09-18 DIAGNOSIS — H2510 Age-related nuclear cataract, unspecified eye: Secondary | ICD-10-CM | POA: Diagnosis not present

## 2012-09-18 DIAGNOSIS — E11329 Type 2 diabetes mellitus with mild nonproliferative diabetic retinopathy without macular edema: Secondary | ICD-10-CM | POA: Diagnosis not present

## 2012-09-18 DIAGNOSIS — H43829 Vitreomacular adhesion, unspecified eye: Secondary | ICD-10-CM | POA: Diagnosis not present

## 2012-10-29 DIAGNOSIS — K612 Anorectal abscess: Secondary | ICD-10-CM | POA: Diagnosis not present

## 2012-12-12 DIAGNOSIS — E039 Hypothyroidism, unspecified: Secondary | ICD-10-CM | POA: Diagnosis not present

## 2012-12-18 ENCOUNTER — Other Ambulatory Visit (HOSPITAL_BASED_OUTPATIENT_CLINIC_OR_DEPARTMENT_OTHER): Payer: Medicare Other | Admitting: Lab

## 2012-12-18 ENCOUNTER — Ambulatory Visit (HOSPITAL_BASED_OUTPATIENT_CLINIC_OR_DEPARTMENT_OTHER): Payer: Medicare Other | Admitting: Hematology & Oncology

## 2012-12-18 VITALS — BP 112/63 | HR 87 | Temp 98.1°F | Resp 16 | Wt 181.0 lb

## 2012-12-18 DIAGNOSIS — C20 Malignant neoplasm of rectum: Secondary | ICD-10-CM

## 2012-12-18 DIAGNOSIS — D649 Anemia, unspecified: Secondary | ICD-10-CM | POA: Diagnosis not present

## 2012-12-18 LAB — COMPREHENSIVE METABOLIC PANEL
AST: 14 U/L (ref 0–37)
Albumin: 4.5 g/dL (ref 3.5–5.2)
BUN: 24 mg/dL — ABNORMAL HIGH (ref 6–23)
Calcium: 9.6 mg/dL (ref 8.4–10.5)
Chloride: 98 mEq/L (ref 96–112)
Glucose, Bld: 190 mg/dL — ABNORMAL HIGH (ref 70–99)
Potassium: 4.5 mEq/L (ref 3.5–5.3)
Sodium: 134 mEq/L — ABNORMAL LOW (ref 135–145)
Total Protein: 7.1 g/dL (ref 6.0–8.3)

## 2012-12-18 LAB — CBC WITH DIFFERENTIAL (CANCER CENTER ONLY)
Eosinophils Absolute: 0.3 10*3/uL (ref 0.0–0.5)
HCT: 36.3 % (ref 34.8–46.6)
LYMPH%: 29.3 % (ref 14.0–48.0)
MCV: 88 fL (ref 81–101)
MONO#: 0.5 10*3/uL (ref 0.1–0.9)
NEUT%: 59.5 % (ref 39.6–80.0)
RBC: 4.13 10*6/uL (ref 3.70–5.32)
WBC: 8.2 10*3/uL (ref 3.9–10.0)

## 2012-12-18 LAB — CEA: CEA: 0.8 ng/mL (ref 0.0–5.0)

## 2012-12-18 NOTE — Progress Notes (Signed)
This office note has been dictated.

## 2012-12-19 NOTE — Progress Notes (Signed)
DIAGNOSIS:  Locally recurrent adenocarcinoma of the rectum.  CURRENT THERAPY:  Observation.  INTERIM HISTORY:  Gina Villarreal comes in for followup.  She is doing well. We see her yearly.  She has had no problems since we last saw her.  She does have other health issues.  She has diabetes and high blood pressure.  She also has hypothyroidism.  These apparently are fairly well controlled.  When we last saw her a year ago, her iron studies looked okay, although she had a little bit of iron deficiency.  She is taking a regular diet right now.  When we saw her a year ago, the anemia that she had improved.  She had a negative monoclonal spike in her serum.  She has had no fevers, sweats, or chills.  She has had no nausea or vomiting.  She has had no leg swelling.  There have been no rashes.  Her last mammogram was done in December of 2013.  This looked fine.  PHYSICAL EXAMINATION:  General:  This is a well-developed, well- nourished white female in no obvious distress.  Vital signs: Temperature of 98.1, pulse 87, respiratory rate 16, blood pressure 112/63.  Weight is 181.  Head and neck:  Show a normocephalic, atraumatic skull.  There are no ocular or oral lesions.  There are no palpable cervical or supraclavicular lymph nodes.  Lungs:  Clear bilaterally.  Cardiac:  Regular rate and rhythm, with a normal S1 and S2.  There are no murmurs, rubs, or bruits.  Abdomen:  Soft.  She has good bowel sounds.  She has a colostomy in the left lower quadrant. There is no fluid wave.  There is no palpable hepatosplenomegaly.  Back: No tenderness over the spine, ribs, or hips.  Extremities:  Show no clubbing, cyanosis, or edema.  Neurological:  Shows no focal neurological deficits.  LABORATORY STUDIES:  White cell count is 8.2, hemoglobin 12.1, hematocrit 36.3, platelet count 331.  MCV is 88.  IMPRESSION:  Gina Villarreal is a very nice 67 year old white female with a history of locally recurrent  adenocarcinoma of the rectum.  This was 10 years ago.  So far, there have been no problems with respect to recurrence.  I think that, at this point, we can probably let her go from the clinic. I am just not adding much to her medical care.  Her anemia has resolved.  We will be more than happy to see Ms. Sammarco, if any problems pop up in the future.  It has been wonderful having been able to be a part of her life for the past 10 years.  It is always fun talking to her about [place].    ______________________________ Josph Macho, M.D. PRE/MEDQ  D:  12/18/2012  T:  12/19/2012  Job:  [id number]

## 2013-02-20 DIAGNOSIS — Z23 Encounter for immunization: Secondary | ICD-10-CM | POA: Diagnosis not present

## 2013-02-27 DIAGNOSIS — D485 Neoplasm of uncertain behavior of skin: Secondary | ICD-10-CM | POA: Diagnosis not present

## 2013-04-29 DIAGNOSIS — J069 Acute upper respiratory infection, unspecified: Secondary | ICD-10-CM | POA: Diagnosis not present

## 2013-05-21 ENCOUNTER — Other Ambulatory Visit: Payer: Self-pay

## 2013-05-21 DIAGNOSIS — Z1231 Encounter for screening mammogram for malignant neoplasm of breast: Secondary | ICD-10-CM

## 2013-05-29 DIAGNOSIS — E119 Type 2 diabetes mellitus without complications: Secondary | ICD-10-CM | POA: Diagnosis not present

## 2013-05-29 DIAGNOSIS — H2510 Age-related nuclear cataract, unspecified eye: Secondary | ICD-10-CM | POA: Diagnosis not present

## 2013-05-29 DIAGNOSIS — E11329 Type 2 diabetes mellitus with mild nonproliferative diabetic retinopathy without macular edema: Secondary | ICD-10-CM | POA: Diagnosis not present

## 2013-06-11 DIAGNOSIS — IMO0001 Reserved for inherently not codable concepts without codable children: Secondary | ICD-10-CM | POA: Diagnosis not present

## 2013-06-11 DIAGNOSIS — I10 Essential (primary) hypertension: Secondary | ICD-10-CM | POA: Diagnosis not present

## 2013-06-11 DIAGNOSIS — N183 Chronic kidney disease, stage 3 unspecified: Secondary | ICD-10-CM | POA: Diagnosis not present

## 2013-06-11 DIAGNOSIS — E785 Hyperlipidemia, unspecified: Secondary | ICD-10-CM | POA: Diagnosis not present

## 2013-06-11 DIAGNOSIS — E039 Hypothyroidism, unspecified: Secondary | ICD-10-CM | POA: Diagnosis not present

## 2013-06-12 ENCOUNTER — Ambulatory Visit
Admission: RE | Admit: 2013-06-12 | Discharge: 2013-06-12 | Disposition: A | Discharge status: Home / Self Care | Payer: Medicare Other | Source: Ambulatory Visit

## 2013-06-12 DIAGNOSIS — Z1231 Encounter for screening mammogram for malignant neoplasm of breast: Secondary | ICD-10-CM | POA: Diagnosis not present

## 2013-06-20 DIAGNOSIS — Z09 Encounter for follow-up examination after completed treatment for conditions other than malignant neoplasm: Secondary | ICD-10-CM | POA: Diagnosis not present

## 2013-06-20 DIAGNOSIS — K5730 Diverticulosis of large intestine without perforation or abscess without bleeding: Secondary | ICD-10-CM | POA: Diagnosis not present

## 2013-06-20 DIAGNOSIS — Z85048 Personal history of other malignant neoplasm of rectum, rectosigmoid junction, and anus: Secondary | ICD-10-CM | POA: Diagnosis not present

## 2013-09-17 DIAGNOSIS — L819 Disorder of pigmentation, unspecified: Secondary | ICD-10-CM | POA: Diagnosis not present

## 2013-09-17 DIAGNOSIS — D235 Other benign neoplasm of skin of trunk: Secondary | ICD-10-CM | POA: Diagnosis not present

## 2013-09-17 DIAGNOSIS — L821 Other seborrheic keratosis: Secondary | ICD-10-CM | POA: Diagnosis not present

## 2013-09-17 DIAGNOSIS — D1801 Hemangioma of skin and subcutaneous tissue: Secondary | ICD-10-CM | POA: Diagnosis not present

## 2013-11-26 DIAGNOSIS — H3560 Retinal hemorrhage, unspecified eye: Secondary | ICD-10-CM | POA: Diagnosis not present

## 2013-11-26 DIAGNOSIS — E11329 Type 2 diabetes mellitus with mild nonproliferative diabetic retinopathy without macular edema: Secondary | ICD-10-CM | POA: Diagnosis not present

## 2014-01-23 DIAGNOSIS — IMO0001 Reserved for inherently not codable concepts without codable children: Secondary | ICD-10-CM | POA: Diagnosis not present

## 2014-01-23 DIAGNOSIS — Z23 Encounter for immunization: Secondary | ICD-10-CM | POA: Diagnosis not present

## 2014-01-23 DIAGNOSIS — E785 Hyperlipidemia, unspecified: Secondary | ICD-10-CM | POA: Diagnosis not present

## 2014-01-23 DIAGNOSIS — E039 Hypothyroidism, unspecified: Secondary | ICD-10-CM | POA: Diagnosis not present

## 2014-01-23 DIAGNOSIS — Z0000 Encounter for general adult medical examination without abnormal findings: Secondary | ICD-10-CM | POA: Diagnosis not present

## 2014-01-23 DIAGNOSIS — I10 Essential (primary) hypertension: Secondary | ICD-10-CM | POA: Diagnosis not present

## 2014-06-08 DIAGNOSIS — H2513 Age-related nuclear cataract, bilateral: Secondary | ICD-10-CM | POA: Diagnosis not present

## 2014-06-08 DIAGNOSIS — H10413 Chronic giant papillary conjunctivitis, bilateral: Secondary | ICD-10-CM | POA: Diagnosis not present

## 2014-06-08 DIAGNOSIS — E11329 Type 2 diabetes mellitus with mild nonproliferative diabetic retinopathy without macular edema: Secondary | ICD-10-CM | POA: Diagnosis not present

## 2014-06-08 DIAGNOSIS — H04123 Dry eye syndrome of bilateral lacrimal glands: Secondary | ICD-10-CM | POA: Diagnosis not present

## 2014-07-24 DIAGNOSIS — E559 Vitamin D deficiency, unspecified: Secondary | ICD-10-CM | POA: Diagnosis not present

## 2014-07-24 DIAGNOSIS — E785 Hyperlipidemia, unspecified: Secondary | ICD-10-CM | POA: Diagnosis not present

## 2014-07-24 DIAGNOSIS — E039 Hypothyroidism, unspecified: Secondary | ICD-10-CM | POA: Diagnosis not present

## 2014-07-24 DIAGNOSIS — E1165 Type 2 diabetes mellitus with hyperglycemia: Secondary | ICD-10-CM | POA: Diagnosis not present

## 2014-07-24 DIAGNOSIS — Z85038 Personal history of other malignant neoplasm of large intestine: Secondary | ICD-10-CM | POA: Diagnosis not present

## 2014-07-24 DIAGNOSIS — R0683 Snoring: Secondary | ICD-10-CM | POA: Diagnosis not present

## 2014-07-24 DIAGNOSIS — I10 Essential (primary) hypertension: Secondary | ICD-10-CM | POA: Diagnosis not present

## 2014-07-27 ENCOUNTER — Other Ambulatory Visit: Payer: Self-pay

## 2014-07-27 DIAGNOSIS — Z1231 Encounter for screening mammogram for malignant neoplasm of breast: Secondary | ICD-10-CM

## 2014-08-18 ENCOUNTER — Ambulatory Visit
Admission: RE | Admit: 2014-08-18 | Discharge: 2014-08-18 | Disposition: A | Discharge status: Home / Self Care | Payer: Medicare Other | Source: Ambulatory Visit

## 2014-08-18 ENCOUNTER — Other Ambulatory Visit: Payer: Self-pay | Admitting: Dermatology

## 2014-08-18 ENCOUNTER — Encounter (INDEPENDENT_AMBULATORY_CARE_PROVIDER_SITE_OTHER): Payer: Self-pay

## 2014-08-18 DIAGNOSIS — Z85820 Personal history of malignant melanoma of skin: Secondary | ICD-10-CM | POA: Diagnosis not present

## 2014-08-18 DIAGNOSIS — Z1231 Encounter for screening mammogram for malignant neoplasm of breast: Secondary | ICD-10-CM | POA: Diagnosis not present

## 2014-08-18 DIAGNOSIS — D485 Neoplasm of uncertain behavior of skin: Secondary | ICD-10-CM | POA: Diagnosis not present

## 2014-08-18 DIAGNOSIS — L821 Other seborrheic keratosis: Secondary | ICD-10-CM | POA: Diagnosis not present

## 2014-08-18 DIAGNOSIS — D2272 Melanocytic nevi of left lower limb, including hip: Secondary | ICD-10-CM | POA: Diagnosis not present

## 2014-08-18 DIAGNOSIS — Z08 Encounter for follow-up examination after completed treatment for malignant neoplasm: Secondary | ICD-10-CM | POA: Diagnosis not present

## 2014-08-18 DIAGNOSIS — D225 Melanocytic nevi of trunk: Secondary | ICD-10-CM | POA: Diagnosis not present

## 2014-09-03 DIAGNOSIS — G4733 Obstructive sleep apnea (adult) (pediatric): Secondary | ICD-10-CM | POA: Diagnosis not present

## 2014-09-04 DIAGNOSIS — G4730 Sleep apnea, unspecified: Secondary | ICD-10-CM | POA: Diagnosis not present

## 2014-11-11 DIAGNOSIS — G4733 Obstructive sleep apnea (adult) (pediatric): Secondary | ICD-10-CM | POA: Diagnosis not present

## 2014-11-26 DIAGNOSIS — L905 Scar conditions and fibrosis of skin: Secondary | ICD-10-CM | POA: Diagnosis not present

## 2014-12-09 DIAGNOSIS — E11329 Type 2 diabetes mellitus with mild nonproliferative diabetic retinopathy without macular edema: Secondary | ICD-10-CM | POA: Diagnosis not present

## 2015-01-29 ENCOUNTER — Encounter: Payer: Self-pay | Admitting: Cardiology

## 2015-01-29 ENCOUNTER — Ambulatory Visit
Admission: RE | Admit: 2015-01-29 | Discharge: 2015-01-29 | Disposition: A | Discharge status: Home / Self Care | Payer: Medicare Other | Source: Ambulatory Visit | Attending: Cardiovascular Disease | Admitting: Cardiovascular Disease

## 2015-01-29 ENCOUNTER — Other Ambulatory Visit: Payer: Self-pay | Admitting: *Deleted

## 2015-01-29 ENCOUNTER — Encounter: Payer: Self-pay | Admitting: Cardiovascular Disease

## 2015-01-29 ENCOUNTER — Ambulatory Visit (INDEPENDENT_AMBULATORY_CARE_PROVIDER_SITE_OTHER): Payer: Medicare Other | Admitting: Cardiovascular Disease

## 2015-01-29 VITALS — BP 104/70 | HR 70 | Ht 65.0 in | Wt 178.9 lb

## 2015-01-29 DIAGNOSIS — I208 Other forms of angina pectoris: Secondary | ICD-10-CM

## 2015-01-29 DIAGNOSIS — D689 Coagulation defect, unspecified: Secondary | ICD-10-CM

## 2015-01-29 DIAGNOSIS — R0602 Shortness of breath: Secondary | ICD-10-CM | POA: Diagnosis not present

## 2015-01-29 DIAGNOSIS — Z01818 Encounter for other preprocedural examination: Secondary | ICD-10-CM

## 2015-01-29 DIAGNOSIS — I209 Angina pectoris, unspecified: Secondary | ICD-10-CM | POA: Diagnosis not present

## 2015-01-29 DIAGNOSIS — Z01810 Encounter for preprocedural cardiovascular examination: Secondary | ICD-10-CM | POA: Diagnosis not present

## 2015-01-29 LAB — CBC
HCT: 37.7 % (ref 36.0–46.0)
Hemoglobin: 12.5 g/dL (ref 12.0–15.0)
MCH: 28.5 pg (ref 26.0–34.0)
MCHC: 33.2 g/dL (ref 30.0–36.0)
MCV: 85.9 fL (ref 78.0–100.0)
MPV: 9.2 fL (ref 8.6–12.4)
Platelets: 322 10*3/uL (ref 150–400)
RBC: 4.39 MIL/uL (ref 3.87–5.11)
RDW: 14.3 % (ref 11.5–15.5)
WBC: 7.7 10*3/uL (ref 4.0–10.5)

## 2015-01-29 LAB — BASIC METABOLIC PANEL
BUN: 18 mg/dL (ref 7–25)
CALCIUM: 9.1 mg/dL (ref 8.6–10.4)
CO2: 26 mmol/L (ref 20–31)
CREATININE: 0.81 mg/dL (ref 0.50–0.99)
Chloride: 100 mmol/L (ref 98–110)
Glucose, Bld: 133 mg/dL — ABNORMAL HIGH (ref 65–99)
Potassium: 4.2 mmol/L (ref 3.5–5.3)
SODIUM: 137 mmol/L (ref 135–146)

## 2015-01-29 IMAGING — CR DG CHEST 2V
2 series · 2 of 2 positions shown · non-contrast
Comparison: CT of the chest [DATE]

CLINICAL DATA: Preprocedural O for cardiac catheterization.

EXAM:
CHEST  2 VIEW

[w chest pa]
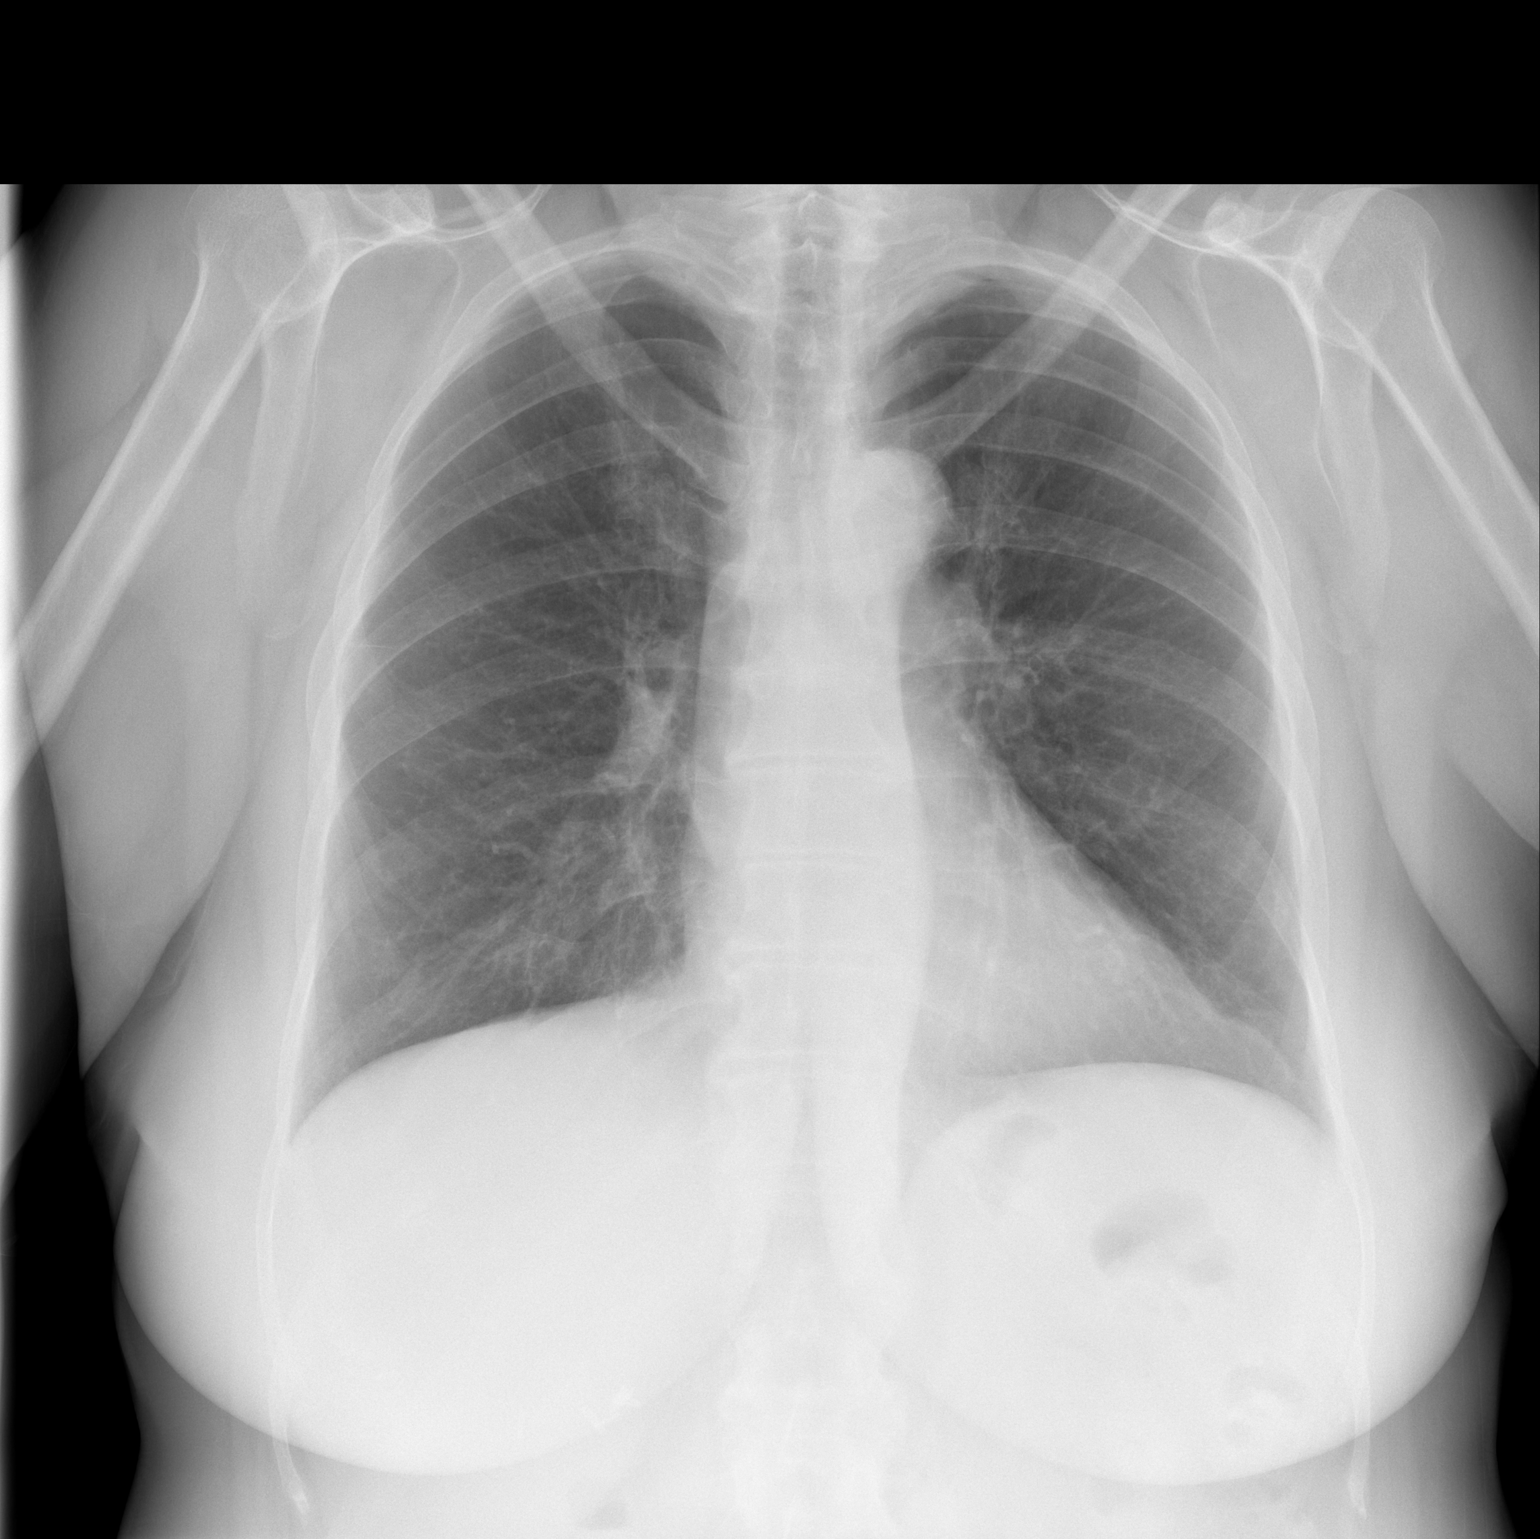

[w chest lat]
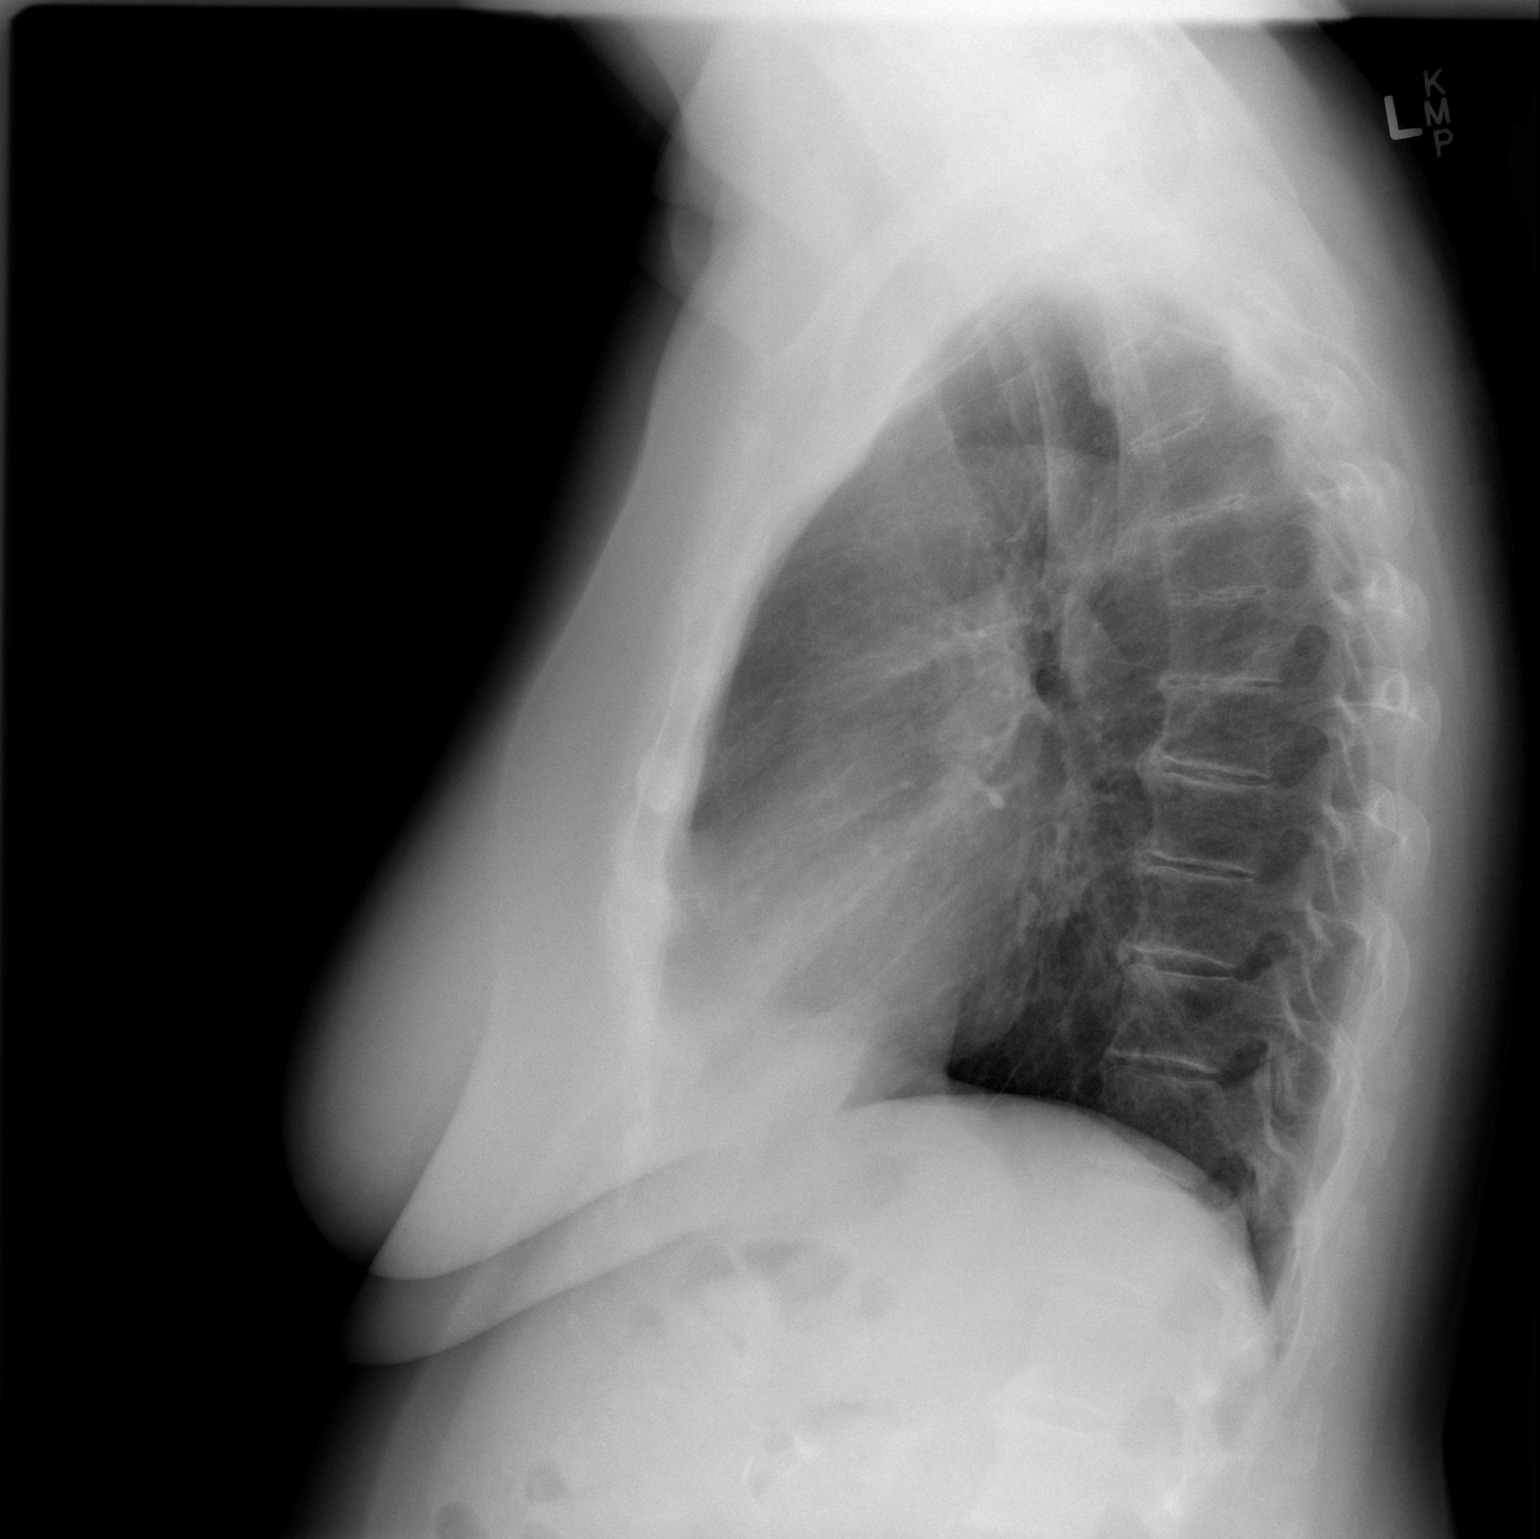

[2 of 2 positions shown; findings below may reference images not displayed]

FINDINGS: Cardiomediastinal silhouette is normal. Mediastinal contours appear
intact. The aorta is torturous.

There is no evidence of focal airspace consolidation, pleural
effusion or pneumothorax.

Osseous structures are without acute abnormality. Soft tissues are
grossly normal.
IMPRESSION: No active cardiopulmonary disease.

## 2015-01-29 MED ORDER — NITROGLYCERIN 0.4 MG SL SUBL: 0.4000 mg | SUBLINGUAL_TABLET | SUBLINGUAL | Status: DC | PRN

## 2015-01-29 NOTE — Patient Instructions (Addendum)
LEFT  HEART CATH - WITH DR [place] Your physician has requested that you have a cardiac catheterization. Cardiac catheterization is used to diagnose and/or treat various heart conditions. Doctors may recommend this procedure for a number of different reasons. The most common reason is to evaluate chest pain. Chest pain can be a symptom of coronary artery disease (CAD), and cardiac catheterization can show whether plaque is narrowing or blocking your heart's arteries. This procedure is also used to evaluate the valves, as well as measure the blood flow and oxygen levels in different parts of your heart. For further information please visit [facility]. Please follow instruction sheet, as given.  LABS- BMP,CBC, PT,PTT - PLEASE GO TO [facility] [address]. CHEST X RAY- [address]   MAY USE NTG SUBLINGUAL TABS AS NEEDED.  Your physician recommends that you schedule a follow-up appointment in WITH DR [place] OR DR [place] CATH.

## 2015-01-29 NOTE — Progress Notes (Signed)
Cardiology Office Note   Date:  01/29/2015   ID:  Gina Villarreal, DOB 1945-05-31, MRN [id number]  PCP:  Gina Frees, MD  Cardiologist:   Gina Harness, MD   Chief Complaint  Patient presents with  . New Evaluation    pt c/o chest pressure in middle of chest and reaching around to her middle back when walking, along with SOB//pt states no other Sx.      History of Present Illness: Gina Villarreal is a 69 y.o. female  With CAD s/p PCI of the RAC in 2003, hypertension, hyperlipidemia and DM type 2 who presents for a evaluation of chest pain.   Gina Villarreal spent 2 weeks visiting with her family in [place] recently and noticed shortness of breath and substernal chest discomfort that occurred when walking. Each day she noticed that after walking about 400 feet she had this discomfort that improved within 2-3 minutes with rest. It then recurred 2-3 more times each day, also with exertion. There was associated shortness of breath but no nausea, vomiting, or diaphoresis.  When she is at home she does not get much exercise. The most rigorous physical activity she has as cleaning the house. She has noted for some time that she has some shortness of breath when making beds or vacuuming. She's not had any recurrent chest pain since returning from [place] 2 days ago.  Gina Villarreal notes that her diet could be improved. Her weakness points are sweets and potatoes. She is switched to drinking carbonated water only. She has red meat 2-3 times per week. She does try to have high intake of fruits and vegetables.  Of note, Gina Villarreal had a bare-metal stent placed in 2003 by Gina Villarreal. At that time she had exertional shortness of breath and an inconclusive treadmill stress test. She ultimately was sent to cath and found to have a 95% lesion in the proximal RCA.  Past Medical History  Diagnosis Date  . Coronary artery disease     Status post stenting of hte proximal coronary Nov. 2003 with a bare-metal  stent  . Combined hyperlipidemia   . Diabetes mellitus     type 2  . Colon cancer   . GERD (gastroesophageal reflux disease)     Past Surgical History  Procedure Laterality Date  . Cholecystectomy    . Appendectomy    . Tonsillectomy    . Oophorectomy    . Colectomy       Current Outpatient Prescriptions  Medication Sig Dispense Refill  . aspirin 325 MG EC tablet Take 325 mg by mouth.    Marland Kitchen glimepiride (AMARYL) 4 MG tablet Take 4 mg by mouth 2 (two) times daily.    Marland Kitchen levothyroxine (SYNTHROID, LEVOTHROID) 175 MCG tablet Take 175 mcg by mouth daily.  0  . lisinopril-hydrochlorothiazide (PRINZIDE,ZESTORETIC) 20-12.5 MG per tablet Take 1 tablet by mouth daily.    . metFORMIN (GLUCOPHAGE) 1000 MG tablet Take 1,000 mg by mouth 2 (two) times daily with a meal.    . omeprazole (PRILOSEC) 40 MG capsule Take 40 mg by mouth 2 (two) times a week. Take twice/week    . pravastatin (PRAVACHOL) 40 MG tablet Take 40 mg by mouth 2 (two) times daily.     No current facility-administered medications for this visit.    Allergies:   Morphine and related    Social History:  The patient  reports that she quit smoking about 13 years ago. Her smoking use included  Cigarettes. She quit after 25 years of use. She does not have any smokeless tobacco history on file. She reports that she does not drink alcohol.   Family History:  The patient's family history includes Heart attack in her father.    ROS:  Please see the history of present illness.   Otherwise, review of systems are positive for none.   All other systems are reviewed and negative.    PHYSICAL EXAM: VS:  BP 104/70 mmHg  Pulse 70  Ht 5\' 5"  (1.651 m)  Wt 81.149 kg (178 lb 14.4 oz)  BMI 29.77 kg/m2 , BMI Body mass index is 29.77 kg/(m^2). GENERAL:  Well appearing HEENT:  Pupils equal round and reactive, fundi not visualized, oral mucosa unremarkable NECK:  No jugular venous distention, waveform within normal limits, carotid upstroke brisk  and symmetric, no bruits, no thyromegaly LYMPHATICS:  No cervical adenopathy LUNGS:  Clear to auscultation bilaterally HEART:  RRR.  PMI not displaced or sustained,S1 and S2 within normal limits, no S3, no S4, no clicks, no rubs, no murmurs ABD:  Flat, positive bowel sounds normal in frequency in pitch, no bruits, no rebound, no guarding, no midline pulsatile mass, no hepatomegaly, no splenomegaly EXT:  2 plus pulses throughout, no edema, no cyanosis no clubbing SKIN:  No rashes no nodules NEURO:  Cranial nerves II through XII grossly intact, motor grossly intact throughout PSYCH:  Cognitively intact, oriented to person place and time    EKG:  EKG is ordered today. The ekg ordered today demonstrates sinus rhythm rate 70 bpm.     Recent Labs: No results found for requested labs within last 365 days.    Lipid Panel No results found for: CHOL, TRIG, HDL, CHOLHDL, VLDL, LDLCALC, LDLDIRECT    Wt Readings from Last 3 Encounters:  01/29/15 81.149 kg (178 lb 14.4 oz)  12/18/12 82.101 kg (181 lb)  12/20/11 84.369 kg (186 lb)     LHC 2003: ANGIOGRAPHIC DATA: 1. The left coronary artery arises and distributes normally. 2. The left main coronary artery is normal. 3. The left anterior descending artery is mildly calcified. There is  diffuse narrowing of the mid LAD up to 30%. 4. The left circumflex coronary artery has only minor wall irregularities,  less than 10%. 5. The right coronary artery arises and distributes normally. There is a  focal 95% stenosis in the proximal right coronary artery. 6. Left ventricular angiography is performed in the RAO view. This  demonstrates moderate inferobasal hypokinesia with overall normal left  ventricular systolic function. Ejection fraction is estimated at 60%.  There is no mitral regurgitation or prolapse.  Other studies Reviewed: Additional studies/ records that were reviewed today include: . Review of the above  records demonstrates:  Please see elsewhere in the note.     ASSESSMENT AND PLAN:  # CCS Class III Angina/CAD: Ms. Gina Villarreal has symptoms that are concerning for exertional angina. Given her history and classic symptoms, we will proceed with coronary angiography. Her pretest probability of obstructive coronary disease is high.  Risks and benefits of cardiac catheterization have been discussed with the patient.  The patient understands that risks included but are not limited to stroke (1 in 1000), death (1 in 11), kidney failure [usually temporary] (1 in 500), bleeding (1 in 200), allergic reaction [possibly serious] (1 in 200). The patient understands and agrees to proceed.  We will check baseline labs (basic metabolic panel, CBC, coags) in preparation for cath. She is to continue on her  aspirin, which could be lowered to 81 mg, especially if she ends up needing a P2 I 12 inhibitor. We discussed the fact that she is on pravastatin, which is not ideal. However she has previously not tolerated atorvastatin or rosuvastatin. Her PCP manages her lipids. Ideally, we would also discontinue her hydrochlorothiazide or at least reduce the dose so that she can be on a beta blocker as well. She prefers to wait until we get the results of her cardiac catheterization to make these changes.   Current medicines are reviewed at length with the patient today.  The patient does not have concerns regarding medicines.  The following changes have been made:  no change  Labs/ tests ordered today include:   Orders Placed This Encounter  Procedures  . DG Chest 2 View  . Protime-INR  . APTT  . EKG 12-Lead  . LEFT HEART CATHETERIZATION WITH CORONARY ANGIOGRAM     Disposition:   FU with Tiffany C. Oval Linsey, MD or Dr. Peter Villarreal post cath.  Signed, Gina Harness, MD  01/29/2015 12:19 PM    [place]

## 2015-01-30 LAB — PROTIME-INR
INR: 0.9 (ref <1.50)
Prothrombin Time: 12.2 seconds (ref 11.6–15.2)

## 2015-01-30 LAB — APTT: aPTT: 32 seconds (ref 24–37)

## 2015-02-01 ENCOUNTER — Encounter (HOSPITAL_COMMUNITY)
Admission: RE | Disposition: A | Discharge status: Home / Self Care | Payer: Self-pay | Source: Ambulatory Visit | Attending: Cardiology

## 2015-02-01 ENCOUNTER — Encounter (HOSPITAL_COMMUNITY): Payer: Self-pay | Admitting: Cardiology

## 2015-02-01 ENCOUNTER — Ambulatory Visit (HOSPITAL_COMMUNITY)
Admission: RE | Admit: 2015-02-01 | Discharge: 2015-02-02 | Disposition: A | Discharge status: Home / Self Care | Payer: Medicare Other | Source: Ambulatory Visit | Attending: Cardiology | Admitting: Cardiology

## 2015-02-01 DIAGNOSIS — I10 Essential (primary) hypertension: Secondary | ICD-10-CM | POA: Insufficient documentation

## 2015-02-01 DIAGNOSIS — I200 Unstable angina: Secondary | ICD-10-CM | POA: Diagnosis present

## 2015-02-01 DIAGNOSIS — K219 Gastro-esophageal reflux disease without esophagitis: Secondary | ICD-10-CM | POA: Diagnosis not present

## 2015-02-01 DIAGNOSIS — I2584 Coronary atherosclerosis due to calcified coronary lesion: Secondary | ICD-10-CM | POA: Insufficient documentation

## 2015-02-01 DIAGNOSIS — Z87891 Personal history of nicotine dependence: Secondary | ICD-10-CM | POA: Diagnosis not present

## 2015-02-01 DIAGNOSIS — I25110 Atherosclerotic heart disease of native coronary artery with unstable angina pectoris: Secondary | ICD-10-CM

## 2015-02-01 DIAGNOSIS — E785 Hyperlipidemia, unspecified: Secondary | ICD-10-CM | POA: Diagnosis present

## 2015-02-01 DIAGNOSIS — Z85038 Personal history of other malignant neoplasm of large intestine: Secondary | ICD-10-CM | POA: Insufficient documentation

## 2015-02-01 DIAGNOSIS — Z79899 Other long term (current) drug therapy: Secondary | ICD-10-CM | POA: Insufficient documentation

## 2015-02-01 DIAGNOSIS — Z7982 Long term (current) use of aspirin: Secondary | ICD-10-CM | POA: Insufficient documentation

## 2015-02-01 DIAGNOSIS — E119 Type 2 diabetes mellitus without complications: Secondary | ICD-10-CM | POA: Insufficient documentation

## 2015-02-01 DIAGNOSIS — E1165 Type 2 diabetes mellitus with hyperglycemia: Secondary | ICD-10-CM

## 2015-02-01 DIAGNOSIS — E782 Mixed hyperlipidemia: Secondary | ICD-10-CM | POA: Insufficient documentation

## 2015-02-01 DIAGNOSIS — Z955 Presence of coronary angioplasty implant and graft: Secondary | ICD-10-CM | POA: Diagnosis not present

## 2015-02-01 DIAGNOSIS — Z01818 Encounter for other preprocedural examination: Secondary | ICD-10-CM

## 2015-02-01 DIAGNOSIS — Z9049 Acquired absence of other specified parts of digestive tract: Secondary | ICD-10-CM | POA: Diagnosis not present

## 2015-02-01 DIAGNOSIS — Z8249 Family history of ischemic heart disease and other diseases of the circulatory system: Secondary | ICD-10-CM | POA: Insufficient documentation

## 2015-02-01 DIAGNOSIS — I160 Hypertensive urgency: Secondary | ICD-10-CM | POA: Diagnosis present

## 2015-02-01 HISTORY — DX: Essential (primary) hypertension: I10

## 2015-02-01 HISTORY — DX: Angina pectoris, unspecified: I20.9

## 2015-02-01 HISTORY — DX: Unspecified osteoarthritis, unspecified site: M19.90

## 2015-02-01 HISTORY — DX: Type 2 diabetes mellitus without complications: E11.9

## 2015-02-01 HISTORY — DX: Colostomy status: Z93.3

## 2015-02-01 HISTORY — DX: Obstructive sleep apnea (adult) (pediatric): G47.33

## 2015-02-01 HISTORY — DX: Dependence on other enabling machines and devices: Z99.89

## 2015-02-01 HISTORY — PX: CARDIAC CATHETERIZATION: SHX172

## 2015-02-01 HISTORY — DX: Hypothyroidism, unspecified: E03.9

## 2015-02-01 HISTORY — DX: Malignant melanoma of skin, unspecified: C43.9

## 2015-02-01 HISTORY — DX: Malignant neoplasm of rectum: C20

## 2015-02-01 HISTORY — DX: Squamous cell carcinoma of skin of right lower limb, including hip: C44.722

## 2015-02-01 LAB — GLUCOSE, CAPILLARY
GLUCOSE-CAPILLARY: 172 mg/dL — AB (ref 65–99)
GLUCOSE-CAPILLARY: 121 mg/dL — AB (ref 65–99)
Glucose-Capillary: 165 mg/dL — ABNORMAL HIGH (ref 65–99)
Glucose-Capillary: 248 mg/dL — ABNORMAL HIGH (ref 65–99)

## 2015-02-01 LAB — POCT ACTIVATED CLOTTING TIME
ACTIVATED CLOTTING TIME: 257 s
Activated Clotting Time: 251 seconds

## 2015-02-01 SURGERY — LEFT HEART CATH AND CORONARY ANGIOGRAPHY
Anesthesia: LOCAL

## 2015-02-01 MED ORDER — LISINOPRIL-HYDROCHLOROTHIAZIDE 20-12.5 MG PO TABS: 1.0000 | ORAL_TABLET | Freq: Every day | ORAL | Status: DC

## 2015-02-01 MED ORDER — LEVOTHYROXINE SODIUM 50 MCG PO TABS
175.0000 ug | ORAL_TABLET | Freq: Every day | ORAL | Status: DC
  Administered 2015-02-02: 175 ug via ORAL
  Filled 2015-02-01 (×2): qty 1

## 2015-02-01 MED ORDER — PRASUGREL HCL 10 MG PO TABS
ORAL_TABLET | ORAL | Status: AC
  Filled 2015-02-01: qty 6

## 2015-02-01 MED ORDER — GLIMEPIRIDE 4 MG PO TABS
4.0000 mg | ORAL_TABLET | Freq: Two times a day (BID) | ORAL | Status: DC
  Administered 2015-02-01 – 2015-02-02 (×2): 4 mg via ORAL
  Filled 2015-02-01 (×4): qty 1

## 2015-02-01 MED ORDER — MIDAZOLAM HCL 2 MG/2ML IJ SOLN
INTRAMUSCULAR | Status: DC | PRN
  Administered 2015-02-01: 1 mg via INTRAVENOUS

## 2015-02-01 MED ORDER — FENTANYL CITRATE (PF) 100 MCG/2ML IJ SOLN
INTRAMUSCULAR | Status: DC | PRN
  Administered 2015-02-01 (×2): 25 ug via INTRAVENOUS

## 2015-02-01 MED ORDER — HEPARIN SODIUM (PORCINE) 1000 UNIT/ML IJ SOLN
INTRAMUSCULAR | Status: DC | PRN
  Administered 2015-02-01: 3000 [IU] via INTRAVENOUS
  Administered 2015-02-01: 2000 [IU] via INTRAVENOUS
  Administered 2015-02-01: 4000 [IU] via INTRAVENOUS

## 2015-02-01 MED ORDER — LIDOCAINE HCL (PF) 1 % IJ SOLN
INTRAMUSCULAR | Status: DC | PRN
  Administered 2015-02-01: 10:00:00

## 2015-02-01 MED ORDER — LIDOCAINE HCL (PF) 1 % IJ SOLN
INTRAMUSCULAR | Status: AC
Start: 2015-02-01 — End: 2015-02-01
  Filled 2015-02-01: qty 30

## 2015-02-01 MED ORDER — MIDAZOLAM HCL 2 MG/2ML IJ SOLN
INTRAMUSCULAR | Status: AC
  Filled 2015-02-01: qty 4

## 2015-02-01 MED ORDER — PRAVASTATIN SODIUM 40 MG PO TABS
40.0000 mg | ORAL_TABLET | Freq: Two times a day (BID) | ORAL | Status: DC
  Administered 2015-02-01 – 2015-02-02 (×2): 40 mg via ORAL
  Filled 2015-02-01 (×2): qty 1

## 2015-02-01 MED ORDER — SODIUM CHLORIDE 0.9 % IJ SOLN: 3.0000 mL | INTRAMUSCULAR | Status: DC | PRN

## 2015-02-01 MED ORDER — SODIUM CHLORIDE 0.9 % IV SOLN
INTRAVENOUS | Status: DC
  Administered 2015-02-01: 1000 mL via INTRAVENOUS

## 2015-02-01 MED ORDER — PRASUGREL HCL 10 MG PO TABS
10.0000 mg | ORAL_TABLET | Freq: Every day | ORAL | Status: DC
  Administered 2015-02-02: 10 mg via ORAL
  Filled 2015-02-01 (×2): qty 1

## 2015-02-01 MED ORDER — HEPARIN SODIUM (PORCINE) 1000 UNIT/ML IJ SOLN
INTRAMUSCULAR | Status: AC
Start: 2015-02-01 — End: 2015-02-01
  Filled 2015-02-01: qty 1

## 2015-02-01 MED ORDER — VERAPAMIL HCL 2.5 MG/ML IV SOLN
INTRAVENOUS | Status: AC
  Filled 2015-02-01: qty 2

## 2015-02-01 MED ORDER — IOHEXOL 350 MG/ML SOLN
INTRAVENOUS | Status: DC | PRN
  Administered 2015-02-01: 170 mL via INTRACARDIAC

## 2015-02-01 MED ORDER — NITROGLYCERIN 1 MG/10 ML FOR IR/CATH LAB
INTRA_ARTERIAL | Status: AC
  Filled 2015-02-01: qty 10

## 2015-02-01 MED ORDER — ONDANSETRON HCL 4 MG/2ML IJ SOLN: 4.0000 mg | Freq: Four times a day (QID) | INTRAMUSCULAR | Status: DC | PRN

## 2015-02-01 MED ORDER — ACETAMINOPHEN 325 MG PO TABS
650.0000 mg | ORAL_TABLET | ORAL | Status: DC | PRN
  Administered 2015-02-01: 20:00:00 650 mg via ORAL
  Filled 2015-02-01: qty 2

## 2015-02-01 MED ORDER — NITROGLYCERIN 1 MG/10 ML FOR IR/CATH LAB
INTRA_ARTERIAL | Status: DC | PRN
  Administered 2015-02-01 (×2): 200 mL via INTRACORONARY

## 2015-02-01 MED ORDER — ASPIRIN 81 MG PO CHEW
81.0000 mg | CHEWABLE_TABLET | Freq: Every day | ORAL | Status: DC
  Administered 2015-02-02: 10:00:00 81 mg via ORAL
  Filled 2015-02-01: qty 1

## 2015-02-01 MED ORDER — FENTANYL CITRATE (PF) 100 MCG/2ML IJ SOLN
INTRAMUSCULAR | Status: AC
  Filled 2015-02-01: qty 4

## 2015-02-01 MED ORDER — HYDROCHLOROTHIAZIDE 12.5 MG PO CAPS
12.5000 mg | ORAL_CAPSULE | Freq: Every day | ORAL | Status: DC
  Administered 2015-02-02: 10:00:00 12.5 mg via ORAL
  Filled 2015-02-01: qty 1

## 2015-02-01 MED ORDER — NITROGLYCERIN 0.4 MG SL SUBL: 0.4000 mg | SUBLINGUAL_TABLET | SUBLINGUAL | Status: DC | PRN

## 2015-02-01 MED ORDER — SODIUM CHLORIDE 0.9 % WEIGHT BASED INFUSION: 3.0000 mL/kg/h | INTRAVENOUS | Status: AC

## 2015-02-01 MED ORDER — PRASUGREL HCL 10 MG PO TABS
ORAL_TABLET | ORAL | Status: DC | PRN
  Administered 2015-02-01: 60 mg via ORAL

## 2015-02-01 MED ORDER — PANTOPRAZOLE SODIUM 40 MG PO TBEC
40.0000 mg | DELAYED_RELEASE_TABLET | Freq: Every day | ORAL | Status: DC
  Administered 2015-02-01 – 2015-02-02 (×2): 40 mg via ORAL
  Filled 2015-02-01 (×2): qty 1

## 2015-02-01 MED ORDER — VERAPAMIL HCL 2.5 MG/ML IV SOLN
INTRAVENOUS | Status: DC | PRN
  Administered 2015-02-01: 09:00:00 via INTRA_ARTERIAL

## 2015-02-01 MED ORDER — ALUM & MAG HYDROXIDE-SIMETH 200-200-20 MG/5ML PO SUSP
30.0000 mL | ORAL | Status: DC | PRN
  Administered 2015-02-01: 20:00:00 30 mL via ORAL
  Filled 2015-02-01: qty 30

## 2015-02-01 MED ORDER — SODIUM CHLORIDE 0.9 % IJ SOLN
3.0000 mL | Freq: Two times a day (BID) | INTRAMUSCULAR | Status: DC
  Administered 2015-02-01 – 2015-02-02 (×3): 3 mL via INTRAVENOUS

## 2015-02-01 MED ORDER — SODIUM CHLORIDE 0.9 % IV SOLN: 250.0000 mL | INTRAVENOUS | Status: DC | PRN

## 2015-02-01 MED ORDER — LISINOPRIL 10 MG PO TABS
20.0000 mg | ORAL_TABLET | Freq: Every day | ORAL | Status: DC
  Administered 2015-02-02: 20 mg via ORAL
  Filled 2015-02-01: qty 2

## 2015-02-01 SURGICAL SUPPLY — 22 items
BALLN EMERGE MR 2.0X15 (BALLOONS) ×3
BALLN ~~LOC~~ EMERGE MR 2.5X15 (BALLOONS) ×3
BALLN ~~LOC~~ EMERGE MR 2.75X15 (BALLOONS) ×3
BALLOON EMERGE MR 2.0X15 (BALLOONS) ×2 IMPLANT
BALLOON ~~LOC~~ EMERGE MR 2.5X15 (BALLOONS) ×2 IMPLANT
BALLOON ~~LOC~~ EMERGE MR 2.75X15 (BALLOONS) ×2 IMPLANT
CATH INFINITI 5 FR JL3.5 (CATHETERS) ×3 IMPLANT
CATH INFINITI 5FR ANG PIGTAIL (CATHETERS) ×3 IMPLANT
CATH INFINITI JR4 5F (CATHETERS) ×3 IMPLANT
CATH VISTA GUIDE 6FR XBLAD3.5 (CATHETERS) ×3 IMPLANT
DEVICE RAD COMP TR BAND LRG (VASCULAR PRODUCTS) ×3 IMPLANT
GLIDESHEATH SLEND SS 6F .021 (SHEATH) ×3 IMPLANT
KIT ENCORE 26 ADVANTAGE (KITS) ×3 IMPLANT
KIT HEART LEFT (KITS) ×3 IMPLANT
PACK CARDIAC CATHETERIZATION (CUSTOM PROCEDURE TRAY) ×3 IMPLANT
STENT PROMUS PREM MR 2.25X20 (Permanent Stent) ×3 IMPLANT
STENT PROMUS PREM MR 2.5X20 (Permanent Stent) ×3 IMPLANT
SYR MEDRAD MARK V 150ML (SYRINGE) ×3 IMPLANT
TRANSDUCER W/STOPCOCK (MISCELLANEOUS) ×3 IMPLANT
TUBING CIL FLEX 10 FLL-RA (TUBING) ×3 IMPLANT
WIRE ASAHI PROWATER 180CM (WIRE) ×3 IMPLANT
WIRE SAFE-T 1.5MM-J .035X260CM (WIRE) ×3 IMPLANT

## 2015-02-01 NOTE — H&P (View-Only) (Signed)
Cardiology Office Note   Date:  01/29/2015   ID:  Gina Villarreal, DOB 27-Apr-1946, MRN [id number]  PCP:  Shirline Frees, MD  Cardiologist:   Sharol Harness, MD   Chief Complaint  Patient presents with  . New Evaluation    pt c/o chest pressure in middle of chest and reaching around to her middle back when walking, along with SOB//pt states no other Sx.      History of Present Illness: Gina Villarreal is a 69 y.o. female  With CAD s/p PCI of the RAC in 2003, hypertension, hyperlipidemia and DM type 2 who presents for a evaluation of chest pain.   Gina Villarreal spent 2 weeks visiting with her family in [place] recently and noticed shortness of breath and substernal chest discomfort that occurred when walking. Each day she noticed that after walking about 400 feet she had this discomfort that improved within 2-3 minutes with rest. It then recurred 2-3 more times each day, also with exertion. There was associated shortness of breath but no nausea, vomiting, or diaphoresis.  When she is at home she does not get much exercise. The most rigorous physical activity she has as cleaning the house. She has noted for some time that she has some shortness of breath when making beds or vacuuming. She's not had any recurrent chest pain since returning from [place] 2 days ago.  Gina Villarreal notes that her diet could be improved. Her weakness points are sweets and potatoes. She is switched to drinking carbonated water only. She has red meat 2-3 times per week. She does try to have high intake of fruits and vegetables.  Of note, Gina Villarreal had a bare-metal stent placed in 2003 by Dr. Martinique. At that time she had exertional shortness of breath and an inconclusive treadmill stress test. She ultimately was sent to cath and found to have a 95% lesion in the proximal RCA.  Past Medical History  Diagnosis Date  . Coronary artery disease     Status post stenting of hte proximal coronary Nov. 2003 with a bare-metal  stent  . Combined hyperlipidemia   . Diabetes mellitus     type 2  . Colon cancer   . GERD (gastroesophageal reflux disease)     Past Surgical History  Procedure Laterality Date  . Cholecystectomy    . Appendectomy    . Tonsillectomy    . Oophorectomy    . Colectomy       Current Outpatient Prescriptions  Medication Sig Dispense Refill  . aspirin 325 MG EC tablet Take 325 mg by mouth.    Marland Kitchen glimepiride (AMARYL) 4 MG tablet Take 4 mg by mouth 2 (two) times daily.    Marland Kitchen levothyroxine (SYNTHROID, LEVOTHROID) 175 MCG tablet Take 175 mcg by mouth daily.  0  . lisinopril-hydrochlorothiazide (PRINZIDE,ZESTORETIC) 20-12.5 MG per tablet Take 1 tablet by mouth daily.    . metFORMIN (GLUCOPHAGE) 1000 MG tablet Take 1,000 mg by mouth 2 (two) times daily with a meal.    . omeprazole (PRILOSEC) 40 MG capsule Take 40 mg by mouth 2 (two) times a week. Take twice/week    . pravastatin (PRAVACHOL) 40 MG tablet Take 40 mg by mouth 2 (two) times daily.     No current facility-administered medications for this visit.    Allergies:   Morphine and related    Social History:  The patient  reports that she quit smoking about 13 years ago. Her smoking use included  Cigarettes. She quit after 25 years of use. She does not have any smokeless tobacco history on file. She reports that she does not drink alcohol.   Family History:  The patient's family history includes Heart attack in her father.    ROS:  Please see the history of present illness.   Otherwise, review of systems are positive for none.   All other systems are reviewed and negative.    PHYSICAL EXAM: VS:  BP 104/70 mmHg  Pulse 70  Ht 5\' 5"  (1.651 m)  Wt 81.149 kg (178 lb 14.4 oz)  BMI 29.77 kg/m2 , BMI Body mass index is 29.77 kg/(m^2). GENERAL:  Well appearing HEENT:  Pupils equal round and reactive, fundi not visualized, oral mucosa unremarkable NECK:  No jugular venous distention, waveform within normal limits, carotid upstroke brisk  and symmetric, no bruits, no thyromegaly LYMPHATICS:  No cervical adenopathy LUNGS:  Clear to auscultation bilaterally HEART:  RRR.  PMI not displaced or sustained,S1 and S2 within normal limits, no S3, no S4, no clicks, no rubs, no murmurs ABD:  Flat, positive bowel sounds normal in frequency in pitch, no bruits, no rebound, no guarding, no midline pulsatile mass, no hepatomegaly, no splenomegaly EXT:  2 plus pulses throughout, no edema, no cyanosis no clubbing SKIN:  No rashes no nodules NEURO:  Cranial nerves II through XII grossly intact, motor grossly intact throughout PSYCH:  Cognitively intact, oriented to person place and time    EKG:  EKG is ordered today. The ekg ordered today demonstrates sinus rhythm rate 70 bpm.     Recent Labs: No results found for requested labs within last 365 days.    Lipid Panel No results found for: CHOL, TRIG, HDL, CHOLHDL, VLDL, LDLCALC, LDLDIRECT    Wt Readings from Last 3 Encounters:  01/29/15 81.149 kg (178 lb 14.4 oz)  12/18/12 82.101 kg (181 lb)  12/20/11 84.369 kg (186 lb)     LHC 2003: ANGIOGRAPHIC DATA: 1. The left coronary artery arises and distributes normally. 2. The left main coronary artery is normal. 3. The left anterior descending artery is mildly calcified. There is  diffuse narrowing of the mid LAD up to 30%. 4. The left circumflex coronary artery has only minor wall irregularities,  less than 10%. 5. The right coronary artery arises and distributes normally. There is a  focal 95% stenosis in the proximal right coronary artery. 6. Left ventricular angiography is performed in the RAO view. This  demonstrates moderate inferobasal hypokinesia with overall normal left  ventricular systolic function. Ejection fraction is estimated at 60%.  There is no mitral regurgitation or prolapse.  Other studies Reviewed: Additional studies/ records that were reviewed today include: . Review of the above  records demonstrates:  Please see elsewhere in the note.     ASSESSMENT AND PLAN:  # CCS Class III Angina/CAD: Gina Villarreal has symptoms that are concerning for exertional angina. Given her history and classic symptoms, we will proceed with coronary angiography. Her pretest probability of obstructive coronary disease is high.  Risks and benefits of cardiac catheterization have been discussed with the patient.  The patient understands that risks included but are not limited to stroke (1 in 1000), death (1 in 67), kidney failure [usually temporary] (1 in 500), bleeding (1 in 200), allergic reaction [possibly serious] (1 in 200). The patient understands and agrees to proceed.  We will check baseline labs (basic metabolic panel, CBC, coags) in preparation for cath. She is to continue on her  aspirin, which could be lowered to 81 mg, especially if she ends up needing a P2 I 12 inhibitor. We discussed the fact that she is on pravastatin, which is not ideal. However she has previously not tolerated atorvastatin or rosuvastatin. Her PCP manages her lipids. Ideally, we would also discontinue her hydrochlorothiazide or at least reduce the dose so that she can be on a beta blocker as well. She prefers to wait until we get the results of her cardiac catheterization to make these changes.   Current medicines are reviewed at length with the patient today.  The patient does not have concerns regarding medicines.  The following changes have been made:  no change  Labs/ tests ordered today include:   Orders Placed This Encounter  Procedures  . DG Chest 2 View  . Protime-INR  . APTT  . EKG 12-Lead  . LEFT HEART CATHETERIZATION WITH CORONARY ANGIOGRAM     Disposition:   FU with Kijana Cromie C. Oval Linsey, MD or Dr. Peter Martinique post cath.  Signed, Sharol Harness, MD  01/29/2015 12:19 PM    [place]

## 2015-02-01 NOTE — Interval H&P Note (Signed)
History and Physical Interval Note:  02/01/2015 8:52 AM  Fallis  has presented today for surgery, with the diagnosis of angina  The various methods of treatment have been discussed with the patient and family. After consideration of risks, benefits and other options for treatment, the patient has consented to  Procedure(s): Left Heart Cath and Coronary Angiography (N/A) as a surgical intervention .  The patient's history has been reviewed, patient examined, no change in status, stable for surgery.  I have reviewed the patient's chart and labs.  Questions were answered to the patient's satisfaction.    Cath Lab Visit (complete for each Cath Lab visit)  Clinical Evaluation Leading to the Procedure:   ACS: Yes.    Non-ACS:    Anginal Classification: CCS III  Anti-ischemic medical therapy: No Therapy  Non-Invasive Test Results: No non-invasive testing performed  Prior CABG: No previous CABG       Collier Salina [facility] 02/01/2015 8:52 AM

## 2015-02-01 NOTE — Progress Notes (Signed)
TR BAND REMOVAL  LOCATION:    right radial  DEFLATED PER PROTOCOL:    Yes.    TIME BAND OFF / DRESSING APPLIED:    1400   SITE UPON ARRIVAL:    Level 1 ( moderate swelling  rt hand fingers . Some bluish  Discoloration)  SITE AFTER BAND REMOVAL:    Level 1(, less swollen, hand color wnl)  REVERSE ALLEN'S TEST:     positive  CIRCULATION SENSATION AND MOVEMENT:    Within Normal Limits   Yes.    COMMENTS:  Tolerated procedure well

## 2015-02-02 ENCOUNTER — Encounter (HOSPITAL_COMMUNITY): Payer: Self-pay | Admitting: Student

## 2015-02-02 ENCOUNTER — Telehealth: Payer: Self-pay | Admitting: Cardiovascular Disease

## 2015-02-02 DIAGNOSIS — K219 Gastro-esophageal reflux disease without esophagitis: Secondary | ICD-10-CM | POA: Diagnosis not present

## 2015-02-02 DIAGNOSIS — E782 Mixed hyperlipidemia: Secondary | ICD-10-CM | POA: Diagnosis not present

## 2015-02-02 DIAGNOSIS — I2584 Coronary atherosclerosis due to calcified coronary lesion: Secondary | ICD-10-CM | POA: Diagnosis not present

## 2015-02-02 DIAGNOSIS — I10 Essential (primary) hypertension: Secondary | ICD-10-CM | POA: Diagnosis not present

## 2015-02-02 DIAGNOSIS — E119 Type 2 diabetes mellitus without complications: Secondary | ICD-10-CM | POA: Diagnosis not present

## 2015-02-02 DIAGNOSIS — I200 Unstable angina: Secondary | ICD-10-CM

## 2015-02-02 DIAGNOSIS — I25110 Atherosclerotic heart disease of native coronary artery with unstable angina pectoris: Secondary | ICD-10-CM | POA: Diagnosis not present

## 2015-02-02 LAB — CBC
HCT: 35.7 % — ABNORMAL LOW (ref 36.0–46.0)
HEMOGLOBIN: 11.8 g/dL — AB (ref 12.0–15.0)
MCH: 28.8 pg (ref 26.0–34.0)
MCHC: 33.1 g/dL (ref 30.0–36.0)
MCV: 87.1 fL (ref 78.0–100.0)
PLATELETS: 243 10*3/uL (ref 150–400)
RBC: 4.1 MIL/uL (ref 3.87–5.11)
RDW: 13.6 % (ref 11.5–15.5)
WBC: 6.8 10*3/uL (ref 4.0–10.5)

## 2015-02-02 LAB — BASIC METABOLIC PANEL
Anion gap: 12 (ref 5–15)
BUN: 16 mg/dL (ref 6–20)
CALCIUM: 9.1 mg/dL (ref 8.9–10.3)
CO2: 23 mmol/L (ref 22–32)
CREATININE: 0.75 mg/dL (ref 0.44–1.00)
Chloride: 102 mmol/L (ref 101–111)
GFR calc Af Amer: >60 mL/min (ref >60)
GFR calc non Af Amer: >60 mL/min (ref >60)
GLUCOSE: 167 mg/dL — AB (ref 65–99)
Potassium: 4.7 mmol/L (ref 3.5–5.1)
Sodium: 137 mmol/L (ref 135–145)

## 2015-02-02 LAB — GLUCOSE, CAPILLARY: GLUCOSE-CAPILLARY: 185 mg/dL — AB (ref 65–99)

## 2015-02-02 MED ORDER — METOPROLOL TARTRATE 12.5 MG HALF TABLET
12.5000 mg | ORAL_TABLET | Freq: Two times a day (BID) | ORAL | Status: DC
  Administered 2015-02-02: 12.5 mg via ORAL
  Filled 2015-02-02: qty 1

## 2015-02-02 MED ORDER — PRASUGREL HCL 10 MG PO TABS
10.0000 mg | ORAL_TABLET | Freq: Every day | ORAL | Status: DC
Start: 2015-02-02 — End: 2015-02-02

## 2015-02-02 MED ORDER — METOPROLOL TARTRATE 25 MG PO TABS: 12.5000 mg | ORAL_TABLET | Freq: Two times a day (BID) | ORAL | Status: DC

## 2015-02-02 MED ORDER — ASPIRIN 81 MG PO CHEW: 81.0000 mg | CHEWABLE_TABLET | Freq: Every day | ORAL | Status: AC

## 2015-02-02 MED ORDER — PRASUGREL HCL 10 MG PO TABS: 10.0000 mg | ORAL_TABLET | Freq: Every day | ORAL | Status: DC

## 2015-02-02 NOTE — Telephone Encounter (Signed)
TCM phone call .Marland Kitchen Appt is 02/12/15 at 8am w/Bryan Hager at the [facility]

## 2015-02-02 NOTE — Discharge Summary (Signed)
CARDIOLOGY DISCHARGE SUMMARY   Patient ID: Gina Villarreal MRN: [id number] DOB/AGE: 69/07/1945 69 y.o.  Admit date: 02/01/2015 Discharge date: 02/02/2015  PCP: Shirline Frees, MD Primary Cardiologist: Dr. Oval Linsey  Primary Discharge Diagnosis: Unstable angina Secondary Discharge Diagnosis: Diabetes mellitus type 2, controlled, HTN (hypertension), Hyperlipidemia  Consults: Cardiac Rehab, Case Management  Procedures: Left Heart Catheterization, Coronary Angiography, Coronary Stent Intervention  Hospital Course: Gina Villarreal is a 69 y.o. female with past medical history of CAD (s/p PCI in RCA 2003), HTN, HLD, and Type 2 Diabetes who was examined in office by Dr. Oval Linsey on 01/29/2015 for shortness of breath and chest discomfort with physical exertion. Her symptoms were consistent with Class 3 angina and cardiac catheterization was recommended. The risks and benefits of the procedure were discussed in detail with the patient and she agreed to proceed with the procedure.   She presented to [facility] on 02/01/2015 for left heart catheterization and possible stent intervention. Cath showed severe 2 vessel obstructive CAD involving the mid LAD and Mid OM1 with DES stents successfully placed to both. DAPT for one year was recommended and she was started on Effient (in addition to ASA she was on PTA). She tolerated the procedure well and no complications were noted.   On the morning of 02/02/2015, she was doing well. She reported having mild sternal chest pain overnight but was without pain that morning. She denied any palpitations or shortness of breath. Her right radial cath site was without tenderness, ecchymosis, or hematoma. She ambulated 857ft with cardiac rehab with only mild soreness in her chest. She was also started on Metoprolol 12.5mg  BID that morning. She was instructed not to resume her Metformin until the morning of 02/03/2015 (48 hours after her cath).   The patient was last  examined by Dr. Martinique and deemed stable for discharge. 10-day TCM appointment has been arranged with Tarri Fuller, PA-C on 02/12/2015. At that time, the patient should have found out if she will be able to afford Effient or not due to their assistance program. If not, she will likely need to be switched to Plavix or another P2Y12 agent. If she is switched to Plavix, Omeprazole will need to be stopped at that time due to drug interactions.   Labs:   Lab Results  Component Value Date   WBC 6.8 02/02/2015   HGB 11.8* 02/02/2015   HCT 35.7* 02/02/2015   MCV 87.1 02/02/2015   PLT 243 02/02/2015     Recent Labs Lab 02/02/15 0352  NA 137  K 4.7  CL 102  CO2 23  BUN 16  CREATININE 0.75  CALCIUM 9.1  GLUCOSE 167*     Radiology:  Dg Chest 2 View: 01/29/2015   CLINICAL DATA:  Preprocedural O for cardiac catheterization.  EXAM: CHEST  2 VIEW  COMPARISON:  CT of the chest 01/12/2009  FINDINGS: Cardiomediastinal silhouette is normal. Mediastinal contours appear intact. The aorta is torturous.  There is no evidence of focal airspace consolidation, pleural effusion or pneumothorax.  Osseous structures are without acute abnormality. Soft tissues are grossly normal.  IMPRESSION: No active cardiopulmonary disease.   Electronically Signed   By: Fidela Salisbury M.D.   On: 01/29/2015 15:24    Cardiac Cath: 02/01/2015  Prox RCA lesion, 10% stenosed. The lesion was previously treated with a bare metal stent greater than two years ago.  Mid LAD to Dist LAD lesion, 45% stenosed.  The left ventricular systolic function is normal.  Prox LAD to Mid LAD lesion, 90% stenosed. There is a 0% residual stenosis post intervention.  A drug-eluting stent was placed.  1st Mrg lesion, 90% stenosed. There is a 0% residual stenosis post intervention.  A drug-eluting stent was placed.  1. Severe 2 vessel obstructive CAD involving the mid LAD and Mid OM1  Continued patency of stent in the proximal RCA 2.  Normal LV function 3. Successful stenting of the mid LAD with DES 4. Successful stenting of the mid OM1 with a DES.  Plan: DAPT for one year. Hold Metformin for 48 hours. Anticipate DC in am.   FOLLOW UP PLANS AND APPOINTMENTS Allergies  Allergen Reactions  . Morphine And Related      Medication List    STOP taking these medications        aspirin 325 MG EC tablet  Replaced by:  aspirin 81 MG chewable tablet      TAKE these medications        aspirin 81 MG chewable tablet  Chew 1 tablet (81 mg total) by mouth daily.     glimepiride 4 MG tablet  Commonly known as:  AMARYL  Take 4 mg by mouth 2 (two) times daily.     levothyroxine 175 MCG tablet  Commonly known as:  SYNTHROID, LEVOTHROID  Take 175 mcg by mouth daily.     lisinopril-hydrochlorothiazide 20-12.5 MG tablet  Commonly known as:  PRINZIDE,ZESTORETIC  Take 1 tablet by mouth daily.     metFORMIN 1000 MG tablet  Commonly known as:  GLUCOPHAGE  Take 1,000 mg by mouth 2 (two) times daily with a meal.  Notes to Patient:  Restart 02/03/15 Thursday     metoprolol tartrate 25 MG tablet  Commonly known as:  LOPRESSOR  Take 0.5 tablets (12.5 mg total) by mouth 2 (two) times daily.     nitroGLYCERIN 0.4 MG SL tablet  Commonly known as:  NITROSTAT  Place 1 tablet (0.4 mg total) under the tongue every 5 (five) minutes as needed for chest pain.     omeprazole 40 MG capsule  Commonly known as:  PRILOSEC  Take 40 mg by mouth 2 (two) times a week. Take twice/week  Notes to Patient:  As scheduled     prasugrel 10 MG Tabs tablet  Commonly known as:  EFFIENT  Take 1 tablet (10 mg total) by mouth daily.     pravastatin 40 MG tablet  Commonly known as:  PRAVACHOL  Take 40 mg by mouth 2 (two) times daily.        Discharge Instructions    Amb Referral to Cardiac Rehabilitation    Complete by:  As directed   Congestive Heart Failure: If diagnosis is Heart Failure, patient MUST meet each of the CMS criteria: 1. Left  Ventricular Ejection Fraction </= 35% 2. NYHA class II-IV symptoms despite being on optimal heart failure therapy for at least 6 weeks. 3. Stable = have not had a recent (<6 weeks) or planned (<6 months) major cardiovascular hospitalization or procedure  Program Details: - Physician supervised classes - 1-3 classes per week over a 12-18 week period, generally for a total of 36 sessions  Physician Certification: I certify that the above Cardiac Rehabilitation treatment is medically necessary and is medically approved by me for treatment of this patient. The patient is willing and cooperative, able to ambulate and medically stable to participate in exercise rehabilitation. The participant's progress and Individualized Treatment Plan will be reviewed by the Medical Director, Cardiac  Rehab staff and as indicated by the Referring/Ordering Physician.  Diagnosis:  PCI          Follow-up Information    Follow up with HAGER, BRYAN, PA-C On 02/12/2015.   Specialties:  Physician Assistant, Radiology, Interventional Cardiology   Why:  Cardiology Hospital Follow-Up on 02/12/2015 at 8:00AM   Contact information:   [address] [place] [address] [phone]       BRING ALL MEDICATIONS WITH YOU TO FOLLOW UP APPOINTMENTS  Time spent with patient to include physician time: 40 minutes Signed: Erma Heritage, PA 02/02/2015, 12:01 PM Co-Sign MD

## 2015-02-02 NOTE — Discharge Instructions (Signed)
PLEASE REMEMBER TO BRING ALL OF YOUR MEDICATIONS TO EACH OF YOUR FOLLOW-UP OFFICE VISITS.  PLEASE ATTEND ALL SCHEDULED FOLLOW-UP APPOINTMENTS.   Activity: Increase activity slowly as tolerated. You may shower, but no soaking baths (or swimming) for 1 week. No driving for 24 hours. No lifting over 5 lbs for 1 week. No sexual activity for 1 week.   You May Return to Work: in 1 week (if applicable)  Wound Care: You may wash cath site gently with soap and water. Keep cath site clean and dry. If you notice pain, swelling, bleeding or pus at your cath site, please call [phone].  DO NOT RESUME TAKING YOUR METFORMIN UNTIL TOMORROW MORNING (02/03/2015)!

## 2015-02-02 NOTE — Progress Notes (Signed)
CM spoke with pt regarding Effient. Pt without any prescription coverage. CM provided pt with Effient booklet with 30 day free sample card and provided pt with [employer] Patient Assistance Program application. CM explained card usage and application process and pt verbally stated understanding of both. No other needs identified per CM @ present time. Whitman Hero RN,BSN,CM [phone]

## 2015-02-02 NOTE — Progress Notes (Signed)
CARDIAC REHAB PHASE I   PRE:  Rate/Rhythm: 81 SR  BP:  Supine:   Sitting: 128/69  Standing:    SaO2:   MODE:  Ambulation: 800 ft   POST:  Rate/Rhythm: 112ST  BP:  Supine:   Sitting: 151/73  Standing:    SaO2:  0745-0840 Pt walked 800 ft with steady gait. Tolerated well. A little sore in chest. Education completed with pt who voiced understanding. Stressed importance of effient with stents. Case manager to see re effient. Reviewed NTG use, carb counting, ex ed and heart healthy food choices. Encouraged walking for ex as pt does not like to ex. Discussed CRP 2 and referring to [place] program.   Graylon Good, RN BSN  02/02/2015 8:38 AM

## 2015-02-02 NOTE — Progress Notes (Signed)
Patient Name: Gina Villarreal Date of Encounter: 02/02/2015  Principal Problem:   Unstable angina Active Problems:   Diabetes mellitus type 2, controlled   HTN (hypertension)   Hyperlipidemia    Primary Cardiologist: Dr. Oval Linsey Patient Profile: 69 yo female w/ PMH of CAD (s/p PCI to RCA 2003), HTN, HLD, and Type II DM who was seen in the office on 01/29/2015 for unstable angina and underwent LHC on 02/01/2015.  SUBJECTIVE: Feeling well. Reports having some sternal chest pain overnight that is minimal and resolved at this time. Denies any palpitations or shortness of breath.  OBJECTIVE Filed Vitals:   02/01/15 1200 02/01/15 1641 02/01/15 2016 02/02/15 0333  BP: 122/57 142/73 126/62 125/60  Pulse: 69 82 83 72  Temp: 97.7 F (36.5 C) 97.7 F (36.5 C) 97.4 F (36.3 C) 97.7 F (36.5 C)  TempSrc: Oral Oral Oral Oral  Resp: 13 15 19 21   Height:      Weight:    176 lb 5.9 oz (80 kg)  SpO2: 97% 97% 97% 98%    Intake/Output Summary (Last 24 hours) at 02/02/15 0622 Last data filed at 02/02/15 0336  Gross per 24 hour  Intake  873.4 ml  Output   1001 ml  Net -127.6 ml   Filed Weights   02/01/15 0721 02/02/15 0333  Weight: 175 lb (79.379 kg) 176 lb 5.9 oz (80 kg)    PHYSICAL EXAM General: Well developed, well nourished, female in no acute distress. Head: Normocephalic, atraumatic.  Neck: Supple without bruits, JVD not elevated. Lungs:  Resp regular and unlabored, CTA without wheezing or rales. Heart: RRR, S1, S2, no S3, S4, or murmur; no rub. Abdomen: Soft, non-tender, non-distended with normoactive bowel sounds. No hepatomegaly. No rebound/guarding. No obvious abdominal masses. Extremities: No clubbing, cyanosis, or edema. Distal pedal pulses are 2+ bilaterally. Right radial cath site without tenderness or ecchymosis. Neuro: Alert and oriented X 3. Moves all extremities spontaneously. Psych: Normal affect.   LABS: CBC: Recent Labs  02/02/15 0352  WBC 6.8  HGB  11.8*  HCT 35.7*  MCV 87.1  PLT 243   INR:No results for input(s): INR in the last 72 hours. Basic Metabolic Panel: Recent Labs  02/02/15 0352  NA 137  K 4.7  CL 102  CO2 23  GLUCOSE 167*  BUN 16  CREATININE 0.75  CALCIUM 9.1    TELE:  NSR with rate in 60's - 80's. Frequent PVC's.    Cardiac Catheterization:  Prox RCA lesion, 10% stenosed. The lesion was previously treated with a bare metal stent greater than two years ago.  Mid LAD to Dist LAD lesion, 45% stenosed.  The left ventricular systolic function is normal.  Prox LAD to Mid LAD lesion, 90% stenosed. There is a 0% residual stenosis post intervention.  A drug-eluting stent was placed.  1st Mrg lesion, 90% stenosed. There is a 0% residual stenosis post intervention.  A drug-eluting stent was placed.  1. Severe 2 vessel obstructive CAD involving the mid LAD and Mid OM1  Continued patency of stent in the proximal RCA 2. Normal LV function 3. Successful stenting of the mid LAD with DES 4. Successful stenting of the mid OM1 with a DES.  Plan: DAPT for one year. Hold Metformin for 48 hours. Anticipate DC in am.   Current Medications:  . aspirin  81 mg Oral Daily  . glimepiride  4 mg Oral BID WC  . lisinopril  20 mg Oral Daily  And  . hydrochlorothiazide  12.5 mg Oral Daily  . levothyroxine  175 mcg Oral QAC breakfast  . pantoprazole  40 mg Oral Daily  . prasugrel  10 mg Oral Daily  . pravastatin  40 mg Oral BID  . sodium chloride  3 mL Intravenous Q12H      ASSESSMENT AND PLAN:  1. Unstable angina - cardiac catheterization on 02/01/2015 showed severe 2 vessel obstructive CAD involving the mid LAD and Mid OM1 with successful stenting of the mid LAD with DES and successful stenting of the mid OM1 with a DES. - continue ASA, ACE-I, Effient, and statin - will start low-dose Metoprolol 12.5mg  BID.  2. Diabetes mellitus type 2, controlled - continue to hold Metformin for 48 hours following cath on  02/01/2015.  3. HTN (hypertension) - BP has been 121/57 - 158/86 in the past 24 hours. - Continue ACE-I and HCTZ. HCTZ may have to be decreased in the future or stopped if patient experiences hypotension after starting new BB.  4. Hyperlipidemia - continue Pravastatin - intolerant to Atorvastatin and Rosuvastatin.  Arna Medici , PA-C 6:22 AM 02/02/2015 Pager: [phone] Patient seen and examined and history reviewed. Agree with above findings and plan. Patient is doing well post PCI. Exam is benign. No chest pain. Labs and Ecg are stable. Agree with low dose metoprolol. Cost of Effient may be a concern since she does not have Medicare part D coverage. Will ask Case manager to assess. If expense is too high may need to switch to other P2Y12 agent.   Peter Martinique, [place] 02/02/2015 8:11 AM

## 2015-02-03 NOTE — Telephone Encounter (Signed)
Spoke to patient. X RAY Result given . Verbalized understanding  Patient contacted regarding discharge from [place]  On 02/02/15.  Patient understands to follow up with provider HAGER on 02/12/15 at  8 AM  at  [facility]. Patient understands discharge instructions? yes Patient understands medications and regiment? yes Patient understands to bring all medications to this visit? yes

## 2015-02-03 NOTE — Telephone Encounter (Signed)
-----   Message from Skeet Latch, MD sent at 02/01/2015 10:54 PM EDT ----- Normal chest xray.

## 2015-02-05 ENCOUNTER — Telehealth: Payer: Self-pay | Admitting: *Deleted

## 2015-02-05 NOTE — Telephone Encounter (Signed)
Faxed - SIGNED CARDIAC REHAB PHASE II

## 2015-02-09 DIAGNOSIS — E039 Hypothyroidism, unspecified: Secondary | ICD-10-CM | POA: Diagnosis not present

## 2015-02-09 DIAGNOSIS — E1165 Type 2 diabetes mellitus with hyperglycemia: Secondary | ICD-10-CM | POA: Diagnosis not present

## 2015-02-09 DIAGNOSIS — E785 Hyperlipidemia, unspecified: Secondary | ICD-10-CM | POA: Diagnosis not present

## 2015-02-09 DIAGNOSIS — I10 Essential (primary) hypertension: Secondary | ICD-10-CM | POA: Diagnosis not present

## 2015-02-09 DIAGNOSIS — Z23 Encounter for immunization: Secondary | ICD-10-CM | POA: Diagnosis not present

## 2015-02-09 DIAGNOSIS — I2510 Atherosclerotic heart disease of native coronary artery without angina pectoris: Secondary | ICD-10-CM | POA: Diagnosis not present

## 2015-02-12 ENCOUNTER — Ambulatory Visit (INDEPENDENT_AMBULATORY_CARE_PROVIDER_SITE_OTHER): Payer: Medicare Other | Admitting: Physician Assistant

## 2015-02-12 ENCOUNTER — Encounter: Payer: Self-pay | Admitting: Physician Assistant

## 2015-02-12 VITALS — BP 139/81 | HR 65 | Ht 65.0 in | Wt 177.7 lb

## 2015-02-12 DIAGNOSIS — I2510 Atherosclerotic heart disease of native coronary artery without angina pectoris: Secondary | ICD-10-CM | POA: Diagnosis not present

## 2015-02-12 DIAGNOSIS — I10 Essential (primary) hypertension: Secondary | ICD-10-CM

## 2015-02-12 DIAGNOSIS — I25810 Atherosclerosis of coronary artery bypass graft(s) without angina pectoris: Secondary | ICD-10-CM | POA: Diagnosis not present

## 2015-02-12 DIAGNOSIS — I2583 Coronary atherosclerosis due to lipid rich plaque: Principal | ICD-10-CM

## 2015-02-12 DIAGNOSIS — E785 Hyperlipidemia, unspecified: Secondary | ICD-10-CM

## 2015-02-12 MED ORDER — CLOPIDOGREL BISULFATE 75 MG PO TABS: 75.0000 mg | ORAL_TABLET | Freq: Every day | ORAL | Status: DC

## 2015-02-12 MED ORDER — METOPROLOL TARTRATE 25 MG PO TABS: 25.0000 mg | ORAL_TABLET | Freq: Two times a day (BID) | ORAL | Status: DC

## 2015-02-12 NOTE — Progress Notes (Signed)
Patient ID: Gina Villarreal, female   DOB: [birth year], 69 y.o.   MRN: [id number]    Date:  02/12/2015   ID:  Gina Villarreal, DOB 04-14-1946, MRN [id number]  PCP:  Shirline Frees, MD  Primary Cardiologist:  Oval Linsey  Chief complaint: Posthospital follow-up   History of Present Illness: Gina Villarreal is a 69 y.o. female with past medical history of CAD (s/p PCI in RCA 2003), HTN, HLD, and Type 2 Diabetes who was examined in office by Dr. Oval Linsey on 01/29/2015 for shortness of breath and chest discomfort with physical exertion. Her symptoms were consistent with Class 3 angina and cardiac catheterization was recommended.    [facility] for left heart catheterization which revealed severe 2 vessel obstructive CAD involving the mid LAD and Mid OM1.  They were treated successfully with DE stents. DAPT for one year was recommended and she was started on Effient (in addition to ASA she was on PTA). She tolerated the procedure well and no complications were noted.  She was also started on Metoprolol 12.5mg  BID that morning.   Patient was asked for posthospital follow-up. She reports having some tightness medial to the left scapula. Seems to get a little bit worse with exertion. May be 5 out of 10 in intensity the symptom is different than prior to her stenting at which time, she was having chest pain. She does have some shortness of breath but that does not get worse on the treadmill. Overall she "feels like blah" The patient currently denies nausea, vomiting, fever, chest pain, orthopnea, dizziness, PND, cough, congestion, abdominal pain, hematochezia, melena, lower extremity edema, claudication.  Wt Readings from Last 3 Encounters:  02/12/15 80.604 kg (177 lb 11.2 oz)  02/02/15 80 kg (176 lb 5.9 oz)  01/29/15 81.149 kg (178 lb 14.4 oz)     Past Medical History  Diagnosis Date  . Coronary artery disease     a. 2003- BMS to RCA b. 01/2015- DES to mid LAD c. DES to mid-OM1    . Combined hyperlipidemia   . GERD (gastroesophageal reflux disease)   . Type II diabetes mellitus ([place])   . Hypertension   . Anginal pain ([place])   . OSA on CPAP   . Hypothyroidism   . 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 Arthritis     "fingers" (02/01/2015)  . Rectal cancer ([place]) 2005  . Squamous cell carcinoma of right foot   . Melanoma ([place])     "back; left lateral knee"    Current Outpatient Prescriptions  Medication Sig Dispense Refill  . aspirin 81 MG chewable tablet Chew 1 tablet (81 mg total) by mouth daily.    Marland Kitchen glimepiride (AMARYL) 4 MG tablet Take 4 mg by mouth 2 (two) times daily.    Marland Kitchen levothyroxine (SYNTHROID, LEVOTHROID) 175 MCG tablet Take 175 mcg by mouth daily.  0  . lisinopril-hydrochlorothiazide (PRINZIDE,ZESTORETIC) 20-12.5 MG per tablet Take 1 tablet by mouth daily.    . metFORMIN (GLUCOPHAGE) 1000 MG tablet Take 1,000 mg by mouth 2 (two) times daily with a meal.    . metoprolol tartrate (LOPRESSOR) 25 MG tablet Take 0.5 tablets (12.5 mg total) by mouth 2 (two) times daily. 60 tablet 6  . nitroGLYCERIN (NITROSTAT) 0.4 MG SL tablet Place 1 tablet (0.4 mg total) under the tongue every 5 (five) minutes as needed for chest pain. 25 tablet 5  . pravastatin (PRAVACHOL) 40 MG tablet Take 40 mg by mouth  2 (two) times daily.     No current facility-administered medications for this visit.    Allergies:    Allergies  Allergen Reactions  . Morphine And Related     Social History:  The patient  reports that she quit smoking about 13 years ago. Her smoking use included Cigarettes. She has a 40 pack-year smoking history. She has never used smokeless tobacco. She reports that she drinks alcohol. She reports that she does not use illicit drugs.   Family history:   Family History  Problem Relation Age of Onset  . Heart attack Father     Pacemaker implant and valve surgery    ROS:  Please see the history of present illness.  All other systems reviewed and negative.    PHYSICAL EXAM: VS:  BP 139/81 mmHg  Pulse 65  Ht 5\' 5"  (1.651 m)  Wt 80.604 kg (177 lb 11.2 oz)  BMI 29.57 kg/m2 Overweight, well developed, in no acute distress HEENT: Pupils are equal round react to light accommodation extraocular movements are intact.  Neck: no JVDNo cervical lymphadenopathy. Cardiac: Regular rate and rhythm without murmurs rubs or gallops. Lungs:  clear to auscultation bilaterally, no wheezing, rhonchi or rales Abd: soft, nontender, positive bowel sounds all quadrants, no hepatosplenomegaly Muscular skeletal: She is nontender around her left scapula Ext: no lower extremity edema.  2+ radial and dorsalis pedis pulses. Skin: warm and dry Neuro:  Grossly normal  EKG:   Normal sinus rhythm rate 65 bpm   ASSESSMENT AND PLAN:  Problem List Items Addressed This Visit    Hyperlipidemia   HTN (hypertension)   Coronary artery disease due to lipid rich plaque - Primary     Coronary artery disease:  left heart catheterization which revealed severe 2 vessel obstructive CAD involving the mid LAD and Mid OM1. Both were stented with drug-eluting stents.  She was started on aspirin and Effient and beta blocker. The Effient is going to cost her $500 per day so we'll switch her to Plavix. We discontinued her omeprazole and I have increased her metoprolol to 25 twice a day..  Not sure what this tightness is in her back. Could just be musculoskeletal or some chest wall inflammation. The symptom is different from her precath symptoms. If it gets worse, I asked her to call for earlier appointment.  Essential hypertension: Titrate metoprolol to 25 twice a day  Hyperlipidemia continue statin

## 2015-02-12 NOTE — Patient Instructions (Addendum)
Your physician has recommended you make the following change in your medication:   1.) the Effient has been replaced with generic plavix.  2.) the metoprololol has been increased to 1 tablet twice a day.  3.) STOP the omeprazole.  Your physician recommends that you schedule a follow-up appointment in: 3 months with Dr. Martinique

## 2015-02-17 ENCOUNTER — Telehealth: Payer: Self-pay | Admitting: Cardiology

## 2015-02-17 MED ORDER — CLOPIDOGREL BISULFATE 75 MG PO TABS: 75.0000 mg | ORAL_TABLET | Freq: Every day | ORAL | Status: DC

## 2015-02-17 MED ORDER — METOPROLOL TARTRATE 25 MG PO TABS: 25.0000 mg | ORAL_TABLET | Freq: Two times a day (BID) | ORAL | Status: DC

## 2015-02-17 MED ORDER — CLOPIDOGREL BISULFATE 75 MG PO TABS
75.0000 mg | ORAL_TABLET | Freq: Every day | ORAL | Status: DC
Start: 2015-02-17 — End: 2015-02-17

## 2015-02-17 NOTE — Telephone Encounter (Signed)
Gina Villarreal is calling to see if she can get her quantity changed on Metoprolol to 180 w/2 refills and her Plavix  90 day with 2 refills and she is asking if you can call this in at [facility] . Please call if you have any questions   Thanks

## 2015-02-17 NOTE — Telephone Encounter (Signed)
Meds refilled for [facility] mail order.

## 2015-02-23 ENCOUNTER — Telehealth: Payer: Self-pay | Admitting: Cardiology

## 2015-02-23 DIAGNOSIS — I200 Unstable angina: Secondary | ICD-10-CM

## 2015-02-23 DIAGNOSIS — R0602 Shortness of breath: Secondary | ICD-10-CM

## 2015-02-23 NOTE — Telephone Encounter (Signed)
Ms. Fenter is calling because she had some stents placed 3wks ago , but she is still having shortness of breath and tightness across her back . Please call   Thanks

## 2015-02-23 NOTE — Telephone Encounter (Signed)
Pt of Dr. Oval Linsey - had cath w stent placement on 9/26 by Dr. Martinique following workup on 9/23 for chest pain. She states she is still having shortness of breath. Notes now the SOB is worse than prior to procedure. She is getting SOB bending over to empty dishwasher, for example. Mainly she describes SOB on exertion. She also states tightness in back when walking/exerting herself. She states the tightness in back is different than the chest pain she was having before.  She saw Tarri Fuller on 10/7 for post-hosp F/u at which time she shared that she'd been having these symptoms. Concerns were addressed at visit. She states she is unconcerned if this is still expected after her stent placement - just wants to be sure. However, she notes that her recovery from bare metal stent placement in 2003 was not as protracted.  Notes also that she still has not heard from Cardiac Rehab about enrollment  Informed pt I would route to physician for advice.

## 2015-02-23 NOTE — Telephone Encounter (Signed)
Returned call to patient.Dr.Jordan advised to schedule echo.Schedulers will call back tomorrow to schedule.Also they will schedule follow up with Dr.Jordan.

## 2015-02-23 NOTE — Telephone Encounter (Signed)
Please order echo and schedule follow up.

## 2015-03-03 ENCOUNTER — Other Ambulatory Visit: Payer: Self-pay

## 2015-03-03 ENCOUNTER — Ambulatory Visit (HOSPITAL_COMMUNITY): Payer: Medicare Other | Attending: Cardiology

## 2015-03-03 DIAGNOSIS — E785 Hyperlipidemia, unspecified: Secondary | ICD-10-CM | POA: Insufficient documentation

## 2015-03-03 DIAGNOSIS — I200 Unstable angina: Secondary | ICD-10-CM

## 2015-03-03 DIAGNOSIS — E119 Type 2 diabetes mellitus without complications: Secondary | ICD-10-CM | POA: Diagnosis not present

## 2015-03-03 DIAGNOSIS — I059 Rheumatic mitral valve disease, unspecified: Secondary | ICD-10-CM | POA: Insufficient documentation

## 2015-03-03 DIAGNOSIS — I358 Other nonrheumatic aortic valve disorders: Secondary | ICD-10-CM | POA: Diagnosis not present

## 2015-03-03 DIAGNOSIS — R0602 Shortness of breath: Secondary | ICD-10-CM | POA: Insufficient documentation

## 2015-03-04 ENCOUNTER — Telehealth: Payer: Self-pay

## 2015-03-04 DIAGNOSIS — R0602 Shortness of breath: Secondary | ICD-10-CM

## 2015-03-04 NOTE — Telephone Encounter (Signed)
Spoke to patient echo results given.Stated she is still sob with the least exertion.Also having tightness in mid back. Message sent to [place] for advice.

## 2015-03-04 NOTE — Telephone Encounter (Signed)
I don't have a good explanation for her dyspnea. Did it get better after the stents and then return or did it never really get better. It makes me think that there is something else going on. Would hold metoprolol. I would consider a CT chest with contrast to rule out a pulmonary embolus.  Rohit Deloria Martinique MD, [facility]

## 2015-03-04 NOTE — Telephone Encounter (Signed)
Returned call to patient.Dr.Jordan advised to hold metoprolol.Advised to have chest ct with contrast.Advised I will call our [facility] office 10/28 to schedule.

## 2015-03-05 ENCOUNTER — Ambulatory Visit (INDEPENDENT_AMBULATORY_CARE_PROVIDER_SITE_OTHER)
Admission: RE | Admit: 2015-03-05 | Discharge: 2015-03-05 | Disposition: A | Discharge status: Home / Self Care | Payer: Medicare Other | Source: Ambulatory Visit | Attending: Cardiology | Admitting: Cardiology

## 2015-03-05 DIAGNOSIS — R0602 Shortness of breath: Secondary | ICD-10-CM | POA: Diagnosis not present

## 2015-03-05 DIAGNOSIS — I2510 Atherosclerotic heart disease of native coronary artery without angina pectoris: Secondary | ICD-10-CM | POA: Diagnosis not present

## 2015-03-05 IMAGING — CT CT ANGIO CHEST
2 of 6 series · 19 of 36 positions shown · IV contrast (omnipaque)
Comparison: [DATE] and prior CTs

CLINICAL DATA: 69-year-old female with increasing shortness of
breath. [place] travel. Coronary stents placed 1 month ago,
currently on Plavix.

EXAM:
CT ANGIOGRAPHY CHEST WITH CONTRAST
TECHNIQUE: Multidetector CT imaging of the chest was performed using the
standard protocol during bolus administration of intravenous
contrast. Multiplanar CT image reconstructions and MIPs were
obtained to evaluate the vascular anatomy.
CONTRAST:  80mL OMNIPAQUE IOHEXOL 350 MG/ML SOLN

[Series 5: thins (id) / (id) · axial · 0.62mm/px · z∈[-246,-19]mm · 18 of 253 slices shown]
[im 13/253  lung]
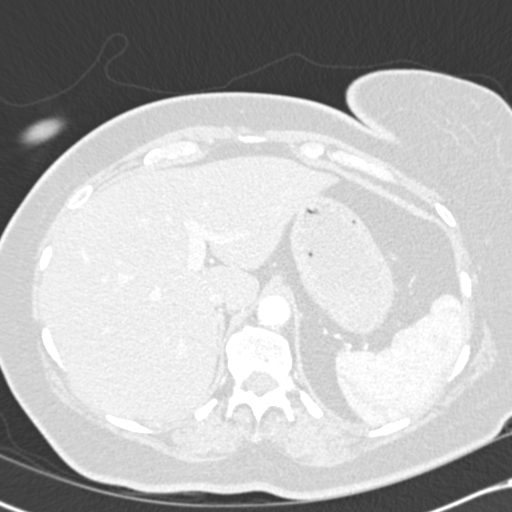
[im 26/253  mediastinal]
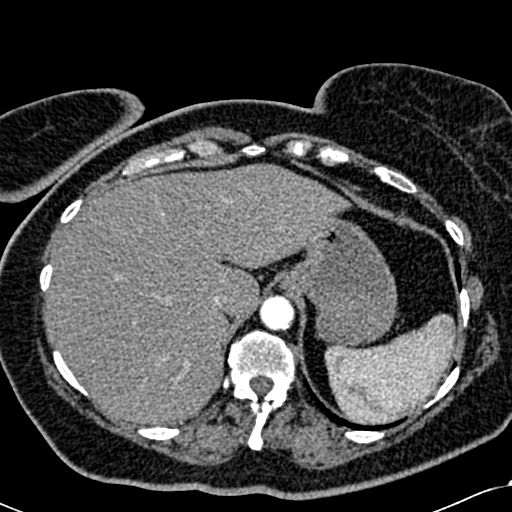
[im 38/253  lung]
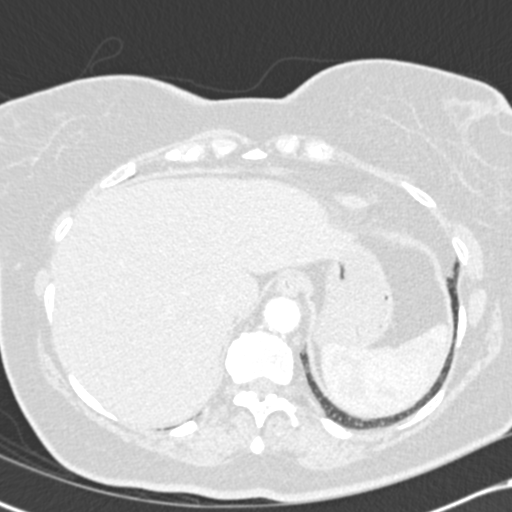
[im 51/253  mediastinal]
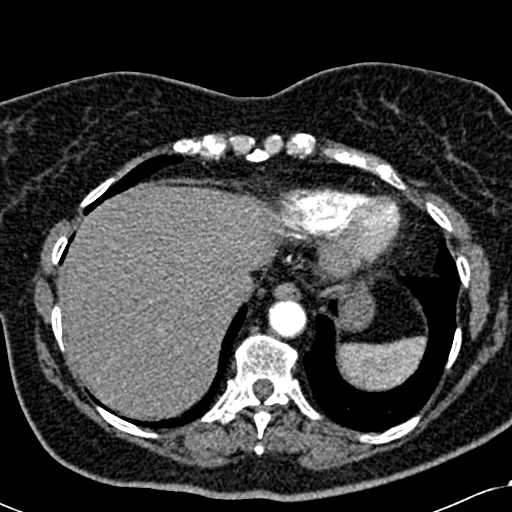
[im 64/253  lung]
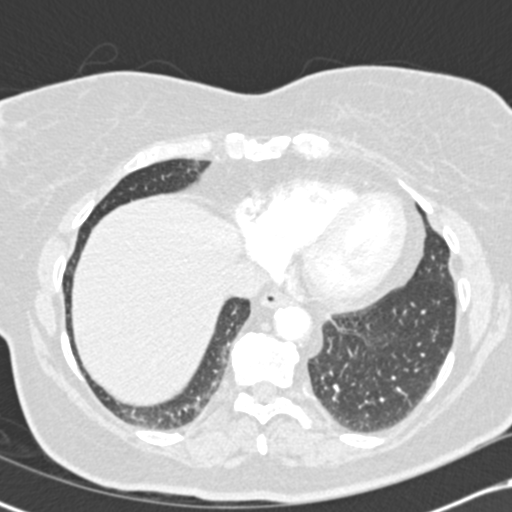
[im 76/253  mediastinal]
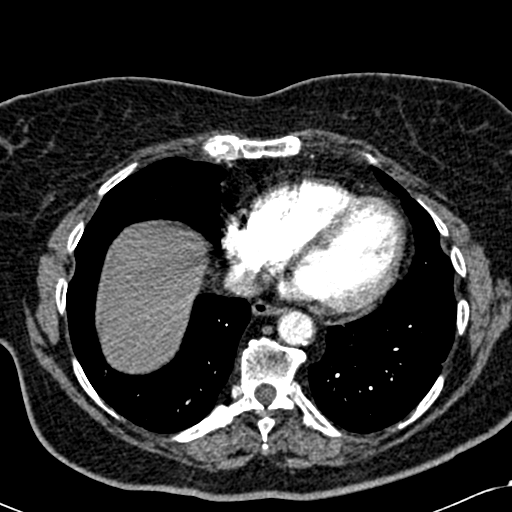
[im 89/253  lung]
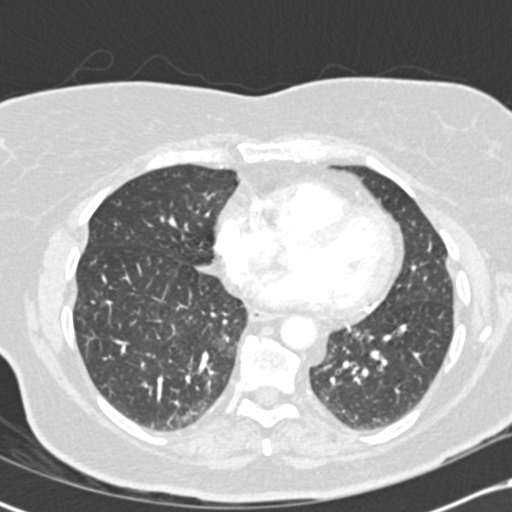
[im 101/253  mediastinal]
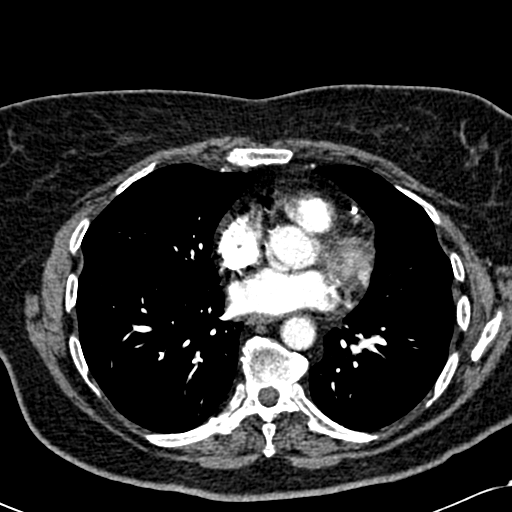
[im 114/253  lung]
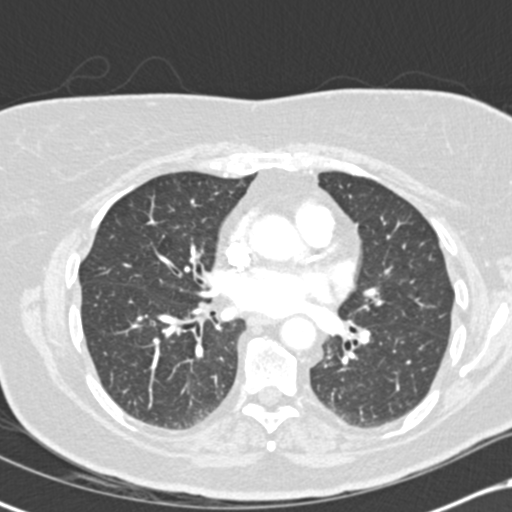
[im 139/253  mediastinal]
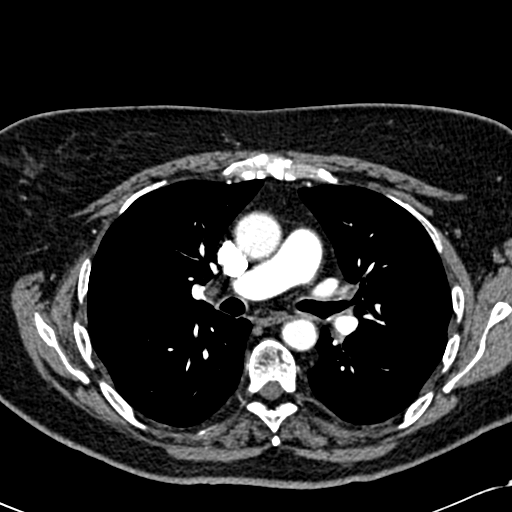
[im 152/253  lung]
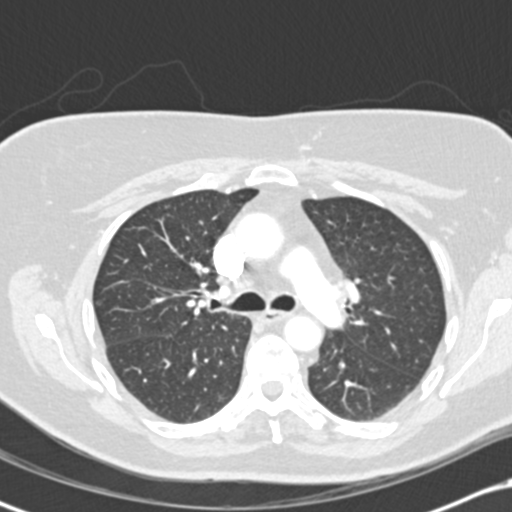
[im 164/253  mediastinal]
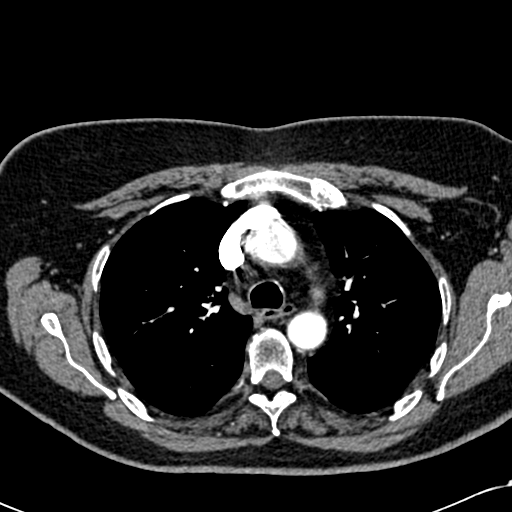
[im 177/253  lung]
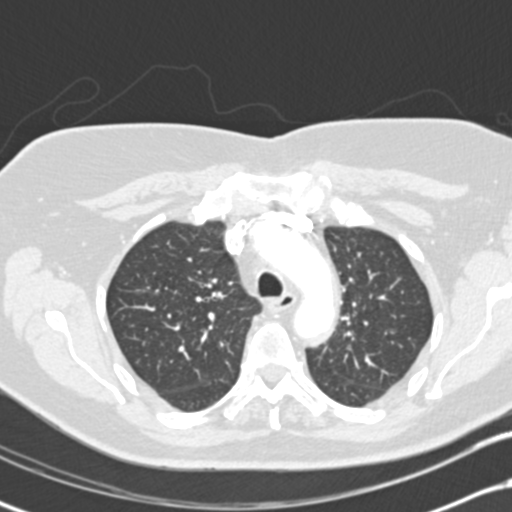
[im 190/253  mediastinal]
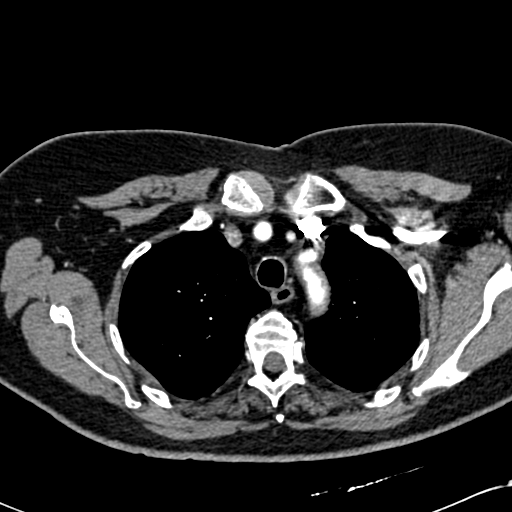
[im 202/253  lung]
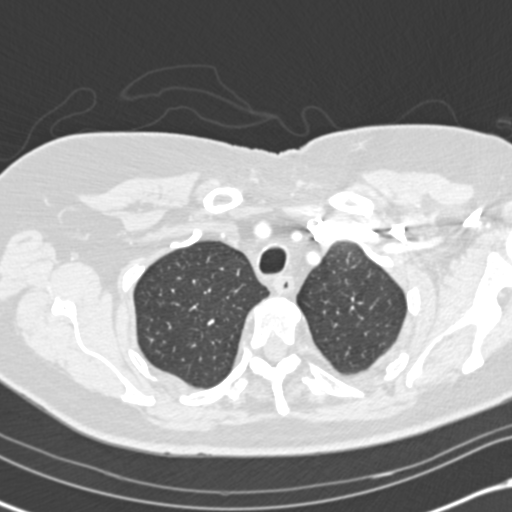
[im 215/253  mediastinal]
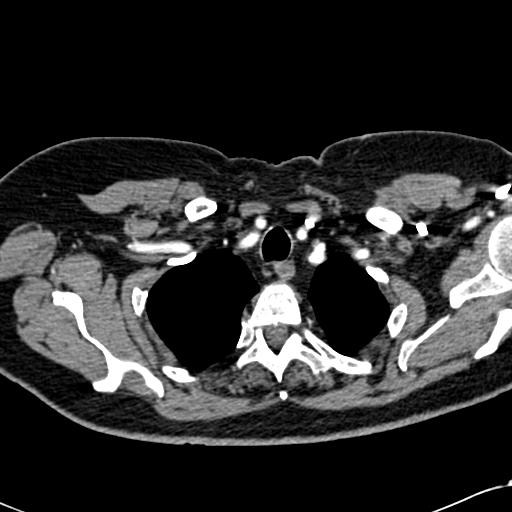
[im 227/253  lung]
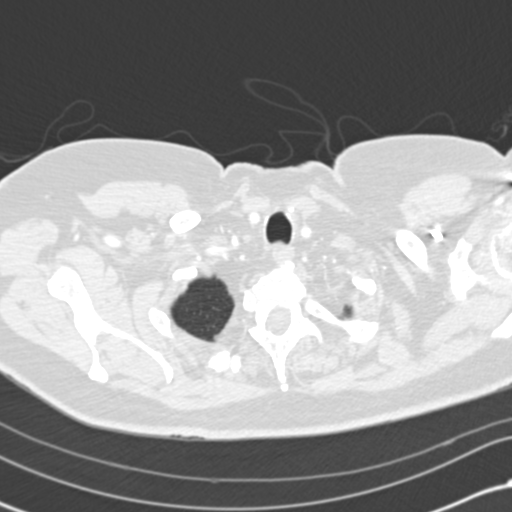
[im 240/253  mediastinal]
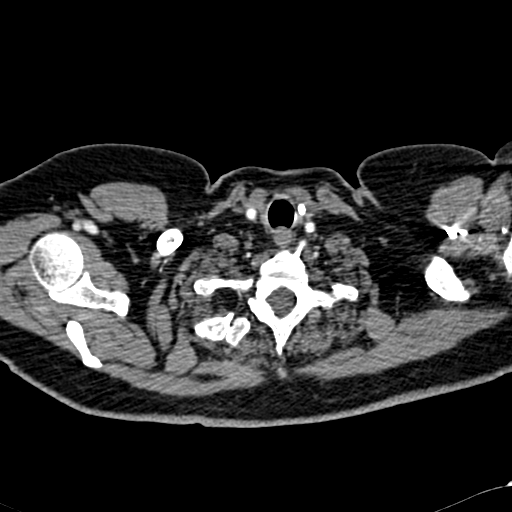

[Series 602: cor mpr · coronal · 0.62mm/px · 1 of 103 slices shown]
[im 52/103  mediastinal]
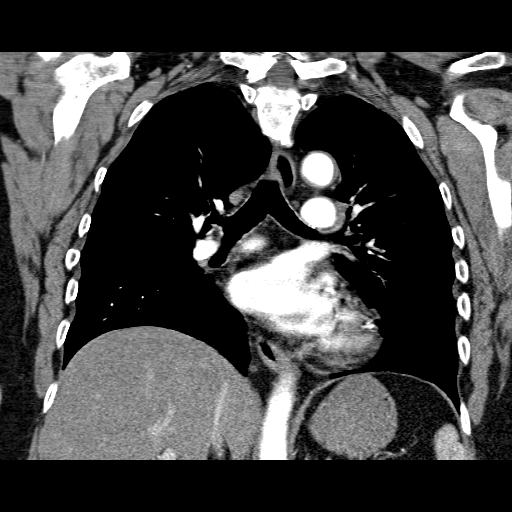

[19 of 36 positions shown; findings below may reference images not displayed]

FINDINGS: This is a technically satisfactory study.

Mediastinum/Nodes: No pulmonary emboli are identified. LAD and
obtuse marginal stents identified. Coronary calcifications are
present. There is no evidence of thoracic aortic aneurysm or
definite dissection. No pericardial effusion or enlarged lymph nodes
noted.

Lungs/Pleura: Tiny bilateral pulmonary nodules are stable from
remote CTs - benign. No focal airspace disease, consolidation,
suspicious nodule, mass or endobronchial/endotracheal lesion
identified. Mild chronic peribronchial thickening is present. There
is no evidence of pleural effusion or pneumothorax.

Upper abdomen: No acute abnormalities.

Musculoskeletal: No acute or suspicious abnormalities.

Review of the MIP images confirms the above findings.
IMPRESSION: No evidence of acute abnormality. No evidence of pulmonary emboli or
thoracic aortic aneurysm.

Coronary artery disease and stents.

Mild chronic peribronchial thickening/ COPD.

## 2015-03-05 MED ORDER — IOHEXOL 350 MG/ML SOLN
80.0000 mL | Freq: Once | INTRAVENOUS | Status: AC | PRN
  Administered 2015-03-05: 80 mL via INTRAVENOUS

## 2015-03-05 NOTE — Telephone Encounter (Signed)
Chest Ct scheduled today at 9:30 am at [facility] office.

## 2015-03-05 NOTE — Addendum Note (Signed)
Addended by: Golden Hurter D on: 03/05/2015 08:18 AM   Modules accepted: Orders

## 2015-03-18 ENCOUNTER — Encounter: Payer: Self-pay | Admitting: Cardiology

## 2015-03-18 ENCOUNTER — Ambulatory Visit (INDEPENDENT_AMBULATORY_CARE_PROVIDER_SITE_OTHER): Payer: Medicare Other | Admitting: Cardiology

## 2015-03-18 VITALS — BP 112/66 | HR 80 | Ht 65.0 in | Wt 176.2 lb

## 2015-03-18 DIAGNOSIS — R0600 Dyspnea, unspecified: Secondary | ICD-10-CM | POA: Diagnosis not present

## 2015-03-18 DIAGNOSIS — I2510 Atherosclerotic heart disease of native coronary artery without angina pectoris: Secondary | ICD-10-CM | POA: Diagnosis not present

## 2015-03-18 DIAGNOSIS — I10 Essential (primary) hypertension: Secondary | ICD-10-CM | POA: Diagnosis not present

## 2015-03-18 DIAGNOSIS — I200 Unstable angina: Secondary | ICD-10-CM

## 2015-03-18 DIAGNOSIS — E785 Hyperlipidemia, unspecified: Secondary | ICD-10-CM | POA: Diagnosis not present

## 2015-03-18 DIAGNOSIS — I2583 Coronary atherosclerosis due to lipid rich plaque: Principal | ICD-10-CM

## 2015-03-18 NOTE — Patient Instructions (Signed)
Go ahead and start Cardiac Rehab.  Continue your current therapy. If your cough persists we may need to switch the lisinopril.  I will see you in 3 months.

## 2015-03-18 NOTE — Progress Notes (Signed)
Cardiology Office Note   Date:  03/18/2015   ID:  Gina Villarreal, DOB 1946-02-03, MRN [id number]  PCP:  Shirline Frees, MD  Cardiologist:   Peter Martinique, MD   Chief Complaint  Patient presents with  . Follow-up    no chest pain-tightness across back, has shortness of breath, no edema, no pain in legs, no cramping in legs, no lightheadedness, no dizziness      History of Present Illness: Gina Villarreal is a 69 y.o. female  With CAD s/p PCI of the RCA in 2003, hypertension, hyperlipidemia and DM type 2.  She was seen in September with substernal chest discomfort that occurred when walking. Each day she noticed that after walking about 400 feet she had this discomfort that improved within 2-3 minutes with rest. It then recurred 2-3 more times each day, also with exertion. There was some associated shortness of breath but no nausea, vomiting, or diaphoresis. Pain was mid sternal in location and across her back.   She underwent cardiac cath on September 26. This demonstrated severe 2 vessel CAD with patent stent in RCA. There was a 90% stenosis in the mid LAD followed by a long segment of 45% disease in the mid to distal vessel. There was also a 90% stenosis in the first OM. The mid LAD and first OM were stented successfully with DES. Afterwards she felt well for about 2 weeks. She had resolution of her anginal symptoms. At that point her beta blocker dose was increased and she started noticing increased SOB with exertion and bending over. No edema. Echo was unremarkable. CXR was OK. Hgb 11.8. CT of the chest showed no PE. There was some mild peribronchial thickening. We discontinued her beta blocker and her breathing improved but 2 weeks ago she developed bronchitis and was treated with antibiotics. She has been inactive since then. Again- no chest pain currently.   Past Medical History  Diagnosis Date  . Coronary artery disease     a. 2003- BMS to RCA b. 01/2015- DES to mid LAD c. DES to  mid-OM1  . Combined hyperlipidemia   . GERD (gastroesophageal reflux disease)   . Type II diabetes mellitus ([place])   . Hypertension   . Anginal pain ([place])   . OSA on CPAP   . Hypothyroidism   . 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 Arthritis     "fingers" (02/01/2015)  . Rectal cancer ([place]) 2005  . Squamous cell carcinoma of right foot   . Melanoma ([place])     "back; left lateral knee"    Past Surgical History  Procedure Laterality Date  . Right oophorectomy Right 1960's  . Colectomy  2005    "rectal"  . Appendectomy  1960's  . Tonsillectomy  1950's  . Laparoscopic cholecystectomy  1970's  . Breast cyst aspiration Right 1980's  . Breast lumpectomy Right 1980's  . Melanoma excision      "back; left lateral knee"  . Squamous cell carcinoma excision Right     "foot"  . Cardiac catheterization N/A 02/01/2015    Procedure: Left Heart Cath and Coronary Angiography;  Surgeon: Peter M Martinique, MD;  Location: [place] [facility];  Service: Cardiovascular;  Laterality: N/A;  . Cardiac catheterization  02/01/2015    Procedure: Coronary Stent Intervention;  Surgeon: Peter M Martinique, MD;  Location: [place] [facility];  Service: Cardiovascular;;  . Coronary angioplasty with stent placement  2003; 2016    a. BMS  to RCA in 2003. b. DES to mid LAD and DES to mid OM1 on 02/01/2015     Current Outpatient Prescriptions  Medication Sig Dispense Refill  . aspirin 81 MG chewable tablet Chew 1 tablet (81 mg total) by mouth daily.    . clopidogrel (PLAVIX) 75 MG tablet Take 1 tablet (75 mg total) by mouth daily. 90 tablet 2  . glimepiride (AMARYL) 4 MG tablet Take 4 mg by mouth 2 (two) times daily.    Marland Kitchen levothyroxine (SYNTHROID, LEVOTHROID) 175 MCG tablet Take 175 mcg by mouth daily.  0  . lisinopril-hydrochlorothiazide (PRINZIDE,ZESTORETIC) 20-12.5 MG per tablet Take 1 tablet by mouth daily.    . metFORMIN (GLUCOPHAGE) 1000 MG tablet Take 1,000 mg by mouth 2 (two) times daily with a meal.    .  nitroGLYCERIN (NITROSTAT) 0.4 MG SL tablet Place 1 tablet (0.4 mg total) under the tongue every 5 (five) minutes as needed for chest pain. 25 tablet 5  . pravastatin (PRAVACHOL) 40 MG tablet Take 40 mg by mouth 2 (two) times daily.     No current facility-administered medications for this visit.    Allergies:   Metoprolol and Morphine and related    Social History:  The patient  reports that she quit smoking about 13 years ago. Her smoking use included Cigarettes. She has a 40 pack-year smoking history. She has never used smokeless tobacco. She reports that she drinks alcohol. She reports that she does not use illicit drugs.   Family History:  The patient's family history includes Heart attack in her father.    ROS:  Please see the history of present illness.   Otherwise, review of systems are positive for none.   All other systems are reviewed and negative.    PHYSICAL EXAM: VS:  BP 112/66 mmHg  Pulse 80  Ht _0  (1.651 m)  Wt 79.946 kg (176 lb 4 oz)  BMI 29.33 kg/m2 , BMI Body mass index is 29.33 kg/(m^2). GENERAL:  Well appearing WF in NAD HEENT:  Pupils equal round and reactive, fundi not visualized, oral mucosa unremarkable NECK:  No jugular venous distention, waveform within normal limits, carotid upstroke brisk and symmetric, no bruits, no thyromegaly LYMPHATICS:  No cervical adenopathy LUNGS:  Clear to auscultation bilaterally HEART:  RRR.  PMI not displaced or sustained,S1 and S2 within normal limits, no S3, no S4, no clicks, no rubs, no murmurs ABD:  Flat, positive bowel sounds normal in frequency in pitch, no bruits, no rebound, no guarding, no midline pulsatile mass, no hepatomegaly, no splenomegaly EXT:  2 plus pulses throughout, no edema, no cyanosis no clubbing SKIN:  No rashes no nodules NEURO:  Cranial nerves II through XII grossly intact, motor grossly intact throughout PSYCH:  Cognitively intact, oriented to person place and time    EKG:  EKG is not ordered  today.    Recent Labs: 02/02/2015: BUN 16; Creatinine, Ser 0.75; Hemoglobin 11.8*; Platelets 243; Potassium 4.7; Sodium 137    Lipid Panel No results found for: CHOL, TRIG, HDL, CHOLHDL, VLDL, LDLCALC, LDLDIRECT    Wt Readings from Last 3 Encounters:  03/18/15 79.946 kg (176 lb 4 oz)  02/12/15 80.604 kg (177 lb 11.2 oz)  02/02/15 80 kg (176 lb 5.9 oz)    Coronary Stent Intervention    Left Heart Cath and Coronary Angiography    Conclusion     Prox RCA lesion, 10% stenosed. The lesion was previously treated with a bare metal stent greater than two  years ago.  Mid LAD to Dist LAD lesion, 45% stenosed.  The left ventricular systolic function is normal.  Prox LAD to Mid LAD lesion, 90% stenosed. There is a 0% residual stenosis post intervention.  A drug-eluting stent was placed.  1st Mrg lesion, 90% stenosed. There is a 0% residual stenosis post intervention.  A drug-eluting stent was placed.  1. Severe 2 vessel obstructive CAD involving the mid LAD and Mid OM1  Continued patency of stent in the proximal RCA 2. Normal LV function 3. Successful stenting of the mid LAD with DES 4. Successful stenting of the mid OM1 with a DES.  Plan: DAPT for one year. Hold Metformin for 48 hours. Anticipate DC in am.      Indications    Unstable angina (HCC) [I20.0 (ICD-10-CM)]    Technique and Indications    Indication: 69 yo WF with history of CAD s/p stenting of the proximal RCA in 2003 with a BMS (2.75 x 15 Zeta). She presents with unstable angina.  Procedural Details: The right wrist was prepped, draped, and anesthetized with 1% lidocaine. Using the modified Seldinger technique, a 6 French slender sheath was introduced into the right radial artery. 3 mg of verapamil was administered through the sheath, weight-based unfractionated heparin was administered intravenously. Standard Judkins catheters were used for selective coronary angiography and left ventriculography.  Catheter exchanges were performed over an exchange length guidewire. There were no immediate procedural complications. A TR band was used for radial hemostasis at the completion of the procedure. The patient was transferred to the post catheterization recovery area for further monitoring.  Estimated blood loss <50 mL. There were no immediate complications during the procedure.    Coronary Findings    Dominance: Right   Left Main  The vessel was injected is normal in caliber is angiographically normal.     Left Anterior Descending   . Prox LAD to Mid LAD lesion, 90% stenosed. calcified discrete .   Marland Kitchen PCI: The pre-interventional distal flow is normal (TIMI 3). Pre-stent angioplasty was performed. 2.0 mm balloon A drug-eluting stent was placed. Post-stent angioplasty was performed. 2.75 mm Davison balloon Maximum pressure: 16 atm. The post-interventional distal flow is normal (TIMI 3). The intervention was successful. No complications occurred at this lesion.  . Supplies used: STENT PROMUS PREM MR 2.5X20  . There is no residual stenosis post intervention.     . Mid LAD to Dist LAD lesion, 45% stenosed. diffuse .     Left Circumflex   . First Obtuse Marginal Branch   The vessel is large in size.   . 1st Mrg lesion, 90% stenosed. discrete .   Marland Kitchen PCI: The pre-interventional distal flow is normal (TIMI 3). Pre-stent angioplasty was performed. 2.0 mm balloon A drug-eluting stent was placed. 2.25 x 20 mm Promus stent Post-stent angioplasty was performed. 2.5 mm Commack balloon The post-interventional distal flow is normal (TIMI 3). The intervention was successful. No complications occurred at this lesion.  . There is no residual stenosis post intervention.     . Lateral First Obtuse Marginal Branch   The vessel is small in size.     Right Coronary Artery   . Prox RCA lesion, 10% stenosed. The lesion was previously treated with a bare metal stent greater than two years ago.      Wall Motion                   Left Heart    Left Ventricle The  left ventricular size is normal. The left ventricular systolic function is normal. The left ventricular ejection fraction is 55-65% by visual estimate. There are no wall motion abnormalities in the left ventricle.   Mitral Valve There is no mitral valve regurgitation.    Coronary Diagrams    Diagnostic Diagram           Post-Intervention Diagram             Echo: 03/03/15:Study Conclusions  - Left ventricle: The cavity size was normal. Systolic function was normal. The estimated ejection fraction was in the range of 60% to 65%. Wall motion was normal; there were no regional wall motion abnormalities. There was an increased relative contribution of atrial contraction to ventricular filling. Doppler parameters are consistent with abnormal left ventricular relaxation (grade 1 diastolic dysfunction). - Aortic valve: Trileaflet; normal thickness, mildly calcified leaflets. - Mitral valve: Calcified annulus.    CT ANGIOGRAPHY CHEST WITH CONTRAST  TECHNIQUE: Multidetector CT imaging of the chest was performed using the standard protocol during bolus administration of intravenous contrast. Multiplanar CT image reconstructions and MIPs were obtained to evaluate the vascular anatomy.  CONTRAST: 36m OMNIPAQUE IOHEXOL 350 MG/ML SOLN  COMPARISON: 01/12/2009 and prior CTs  FINDINGS: This is a technically satisfactory study.  Mediastinum/Nodes: No pulmonary emboli are identified. LAD and obtuse marginal stents identified. Coronary calcifications are present. There is no evidence of thoracic aortic aneurysm or definite dissection. No pericardial effusion or enlarged lymph nodes noted.  Lungs/Pleura: Tiny bilateral pulmonary nodules are stable from remote CTs - benign. No focal airspace disease, consolidation, suspicious nodule, mass or endobronchial/endotracheal lesion identified. Mild chronic  peribronchial thickening is present. There is no evidence of pleural effusion or pneumothorax.  Upper abdomen: No acute abnormalities.  Musculoskeletal: No acute or suspicious abnormalities.  Review of the MIP images confirms the above findings.  IMPRESSION: No evidence of acute abnormality. No evidence of pulmonary emboli or thoracic aortic aneurysm.  Coronary artery disease and stents.  Mild chronic peribronchial thickening/ COPD.   Electronically Signed  By: JMargarette CanadaM.D.  On: 03/05/2015 10:10      Result Notes     Notes Recorded by CLuanna Salk LPN on 182/06/0156at 4:46 PM Patient called results given.Appointment scheduled with Dr.Jordan 03/18/15 at 9:45 am. ------  Notes Recorded by Peter M JMartinique MD on 03/05/2015 at 1:56 PM No PE. CT looks ok. Mild peribronchial thickening/COPD. I would like to see her back in the office so we can review everything and see where to go from here.  Peter JMartiniqueMD, FNorthern [facility]           Vitals     Height Weight BMI (Calculated)    _0  (1.651 m) 79.946 kg (176 lb 4 oz) 29.4      Interpretation Summary     CLINICAL DATA: 69year old female with increasing shortness of breath. Recent [place] travel. Coronary stents placed 1 month ago, currently on Plavix.  EXAM: CT ANGIOGRAPHY CHEST WITH CONTRAST  TECHNIQUE: Multidetector CT imaging of the chest was performed using the standard protocol during bolus administration of intravenous contrast. Multiplanar CT image reconstructions and MIPs were obtained to evaluate the vascular anatomy.  CONTRAST: 845mOMNIPAQUE IOHEXOL 350 MG/ML SOLN  COMPARISON: 01/12/2009 and prior CTs  FINDINGS: This is a technically satisfactory study.  Mediastinum/Nodes: No pulmonary emboli are identified. LAD and obtuse marginal stents identified. Coronary calcifications are present. There is no evidence of thoracic aortic aneurysm or definite  dissection. No  pericardial effusion or enlarged lymph nodes noted.  Lungs/Pleura: Tiny bilateral pulmonary nodules are stable from remote CTs - benign. No focal airspace disease, consolidation, suspicious nodule, mass or endobronchial/endotracheal lesion identified. Mild chronic peribronchial thickening is present. There is no evidence of pleural effusion or pneumothorax.  Upper abdomen: No acute abnormalities.  Musculoskeletal: No acute or suspicious abnormalities.  Review of the MIP images confirms the above findings.  IMPRESSION: No evidence of acute abnormality. No evidence of pulmonary emboli or thoracic aortic aneurysm.  Coronary artery disease and stents.  Mild chronic peribronchial thickening/ COPD.   Electronically Signed  By: Margarette Canada M.D.  On: 03/05/2015 10:10      Other studies Reviewed: Additional studies/ records that were reviewed today include: . Review of the above records demonstrates:  Please see elsewhere in the note.     ASSESSMENT AND PLAN:  1. CAD s/p remote stenting of the RCA in 2003. S/p recent DES stenting of the LAD and first OM. Anginal symptoms have resolved. She is still experiencing some dyspnea on exertion. I think this was aggravated by beta blocker therapy. No evidence of PE or CHF. She is deconditioned. She had recent bronchitis. I recommend cardiac Rehab at this point. If cough persists may need to consider switching ACEi to an ARB. I will follow up in 3 months. 2. HTN well controlled. 3. Hyperlipidemia controlled on pravastatin.    Current medicines are reviewed at length with the patient today.  The patient does not have concerns regarding medicines.  The following changes have been made:  no change  Labs/ tests ordered today include:   No orders of the defined types were placed in this encounter.   Refer to Cardiac Rehab.  Disposition:   FU with me in 3 months.  Signed, Peter Martinique, MD  03/18/2015 10:05 AM     [place]

## 2015-03-30 ENCOUNTER — Encounter (HOSPITAL_COMMUNITY)
Admission: RE | Admit: 2015-03-30 | Discharge: 2015-03-30 | Disposition: A | Discharge status: Home / Self Care | Payer: Medicare Other | Source: Ambulatory Visit | Attending: Cardiology | Admitting: Cardiology

## 2015-03-30 DIAGNOSIS — Z955 Presence of coronary angioplasty implant and graft: Secondary | ICD-10-CM | POA: Insufficient documentation

## 2015-03-30 DIAGNOSIS — Z79899 Other long term (current) drug therapy: Secondary | ICD-10-CM | POA: Insufficient documentation

## 2015-03-30 NOTE — Progress Notes (Signed)
Cardiac Rehab Medication Review by a Pharmacist  Does the patient  feel that his/her medications are working for him/her?  yes  Has the patient been experiencing any side effects to the medications prescribed?  no Reports shortness of breath, but does not associate it with medications  Does the patient measure his/her own blood pressure or blood glucose at home?  yes  Measures blood glucose at home about once weekly.  Does the patient have any problems obtaining medications due to transportation or finances?  no  Understanding of regimen: excellent Understanding of indications: excellent Potential of compliance: excellent    Pharmacist comments: Ms. Delacueva has great understanding of her medications and is compliant. She does not report any side effects or issues with her medications.   Joya San, PharmD Clinical Pharmacy Resident Pager # [phone] 03/30/2015 8:31 AM

## 2015-04-05 ENCOUNTER — Encounter (HOSPITAL_COMMUNITY)
Admission: RE | Admit: 2015-04-05 | Discharge: 2015-04-05 | Disposition: A | Discharge status: Home / Self Care | Payer: Medicare Other | Source: Ambulatory Visit | Attending: Cardiology | Admitting: Cardiology

## 2015-04-05 DIAGNOSIS — Z955 Presence of coronary angioplasty implant and graft: Secondary | ICD-10-CM | POA: Diagnosis not present

## 2015-04-05 DIAGNOSIS — Z79899 Other long term (current) drug therapy: Secondary | ICD-10-CM | POA: Diagnosis not present

## 2015-04-05 LAB — GLUCOSE, CAPILLARY
GLUCOSE-CAPILLARY: 181 mg/dL — AB (ref 65–99)
Glucose-Capillary: 179 mg/dL — ABNORMAL HIGH (ref 65–99)

## 2015-04-05 NOTE — Progress Notes (Signed)
Pt started cardiac rehab today.  Pt tolerated light exercise without difficulty. VSS, telemetry-Sinus Rhtyhm, asymptomatic.  Medication list reconciled.  Pt verbalized compliance with medications and denies barriers to compliance. PSYCHOSOCIAL ASSESSMENT:  PHQ-0. Pt exhibits positive coping skills, hopeful outlook with supportive family. No psychosocial needs identified at this time, no psychosocial interventions necessary.    Pt enjoys playing bridge and reading.   Pt cardiac rehab  goal is  to improve shortness of breath and increase muscle tone.  Pt encouraged to participate in functional fitness and exercising on your own to increase ability to achieve these goals.   Pt long term cardiac rehab goal is loose 10 lbs and to increase muscle tone.  Pt oriented to exercise equipment and routine.  Understanding verbalized.

## 2015-04-07 ENCOUNTER — Encounter (HOSPITAL_COMMUNITY)
Admission: RE | Admit: 2015-04-07 | Discharge: 2015-04-07 | Disposition: A | Discharge status: Home / Self Care | Payer: Medicare Other | Source: Ambulatory Visit | Attending: Cardiology | Admitting: Cardiology

## 2015-04-07 DIAGNOSIS — Z79899 Other long term (current) drug therapy: Secondary | ICD-10-CM | POA: Diagnosis not present

## 2015-04-07 DIAGNOSIS — Z955 Presence of coronary angioplasty implant and graft: Secondary | ICD-10-CM | POA: Diagnosis not present

## 2015-04-07 LAB — GLUCOSE, CAPILLARY
GLUCOSE-CAPILLARY: 187 mg/dL — AB (ref 65–99)
GLUCOSE-CAPILLARY: 138 mg/dL — AB (ref 65–99)

## 2015-04-09 ENCOUNTER — Encounter (HOSPITAL_COMMUNITY)
Admission: RE | Admit: 2015-04-09 | Discharge: 2015-04-09 | Disposition: A | Discharge status: Home / Self Care | Payer: Medicare Other | Source: Ambulatory Visit | Attending: Cardiology | Admitting: Cardiology

## 2015-04-09 DIAGNOSIS — Z79899 Other long term (current) drug therapy: Secondary | ICD-10-CM | POA: Diagnosis not present

## 2015-04-09 DIAGNOSIS — Z955 Presence of coronary angioplasty implant and graft: Secondary | ICD-10-CM | POA: Insufficient documentation

## 2015-04-09 LAB — GLUCOSE, CAPILLARY
GLUCOSE-CAPILLARY: 171 mg/dL — AB (ref 65–99)
Glucose-Capillary: 132 mg/dL — ABNORMAL HIGH (ref 65–99)

## 2015-04-12 ENCOUNTER — Encounter (HOSPITAL_COMMUNITY)
Admission: RE | Admit: 2015-04-12 | Discharge: 2015-04-12 | Disposition: A | Discharge status: Home / Self Care | Payer: Medicare Other | Source: Ambulatory Visit | Attending: Cardiology | Admitting: Cardiology

## 2015-04-12 DIAGNOSIS — Z955 Presence of coronary angioplasty implant and graft: Secondary | ICD-10-CM | POA: Diagnosis not present

## 2015-04-12 DIAGNOSIS — Z79899 Other long term (current) drug therapy: Secondary | ICD-10-CM | POA: Diagnosis not present

## 2015-04-12 LAB — GLUCOSE, CAPILLARY: GLUCOSE-CAPILLARY: 141 mg/dL — AB (ref 65–99)

## 2015-04-14 ENCOUNTER — Encounter (HOSPITAL_COMMUNITY)
Admission: RE | Admit: 2015-04-14 | Discharge: 2015-04-14 | Disposition: A | Discharge status: Home / Self Care | Payer: Medicare Other | Source: Ambulatory Visit | Attending: Cardiology | Admitting: Cardiology

## 2015-04-14 DIAGNOSIS — Z79899 Other long term (current) drug therapy: Secondary | ICD-10-CM | POA: Diagnosis not present

## 2015-04-14 DIAGNOSIS — Z955 Presence of coronary angioplasty implant and graft: Secondary | ICD-10-CM | POA: Diagnosis not present

## 2015-04-14 LAB — GLUCOSE, CAPILLARY: Glucose-Capillary: 132 mg/dL — ABNORMAL HIGH (ref 65–99)

## 2015-04-14 NOTE — Progress Notes (Signed)
Reviewed home exercise with pt today.  Pt plans to walk and use treadmill at home for exercise.  Reviewed THR, pulse, RPE, sign and symptoms, NTG use, and when to call 911 or MD.  Pt voiced understanding. Zia Kanner, MA, ACSM RCEP  

## 2015-04-16 ENCOUNTER — Encounter (HOSPITAL_COMMUNITY)
Admission: RE | Admit: 2015-04-16 | Discharge: 2015-04-16 | Disposition: A | Discharge status: Home / Self Care | Payer: Medicare Other | Source: Ambulatory Visit | Attending: Cardiology | Admitting: Cardiology

## 2015-04-16 DIAGNOSIS — Z79899 Other long term (current) drug therapy: Secondary | ICD-10-CM | POA: Diagnosis not present

## 2015-04-16 DIAGNOSIS — Z955 Presence of coronary angioplasty implant and graft: Secondary | ICD-10-CM | POA: Diagnosis not present

## 2015-04-19 ENCOUNTER — Encounter (HOSPITAL_COMMUNITY)
Admission: RE | Admit: 2015-04-19 | Discharge: 2015-04-19 | Disposition: A | Discharge status: Home / Self Care | Payer: Medicare Other | Source: Ambulatory Visit | Attending: Cardiology | Admitting: Cardiology

## 2015-04-19 DIAGNOSIS — Z79899 Other long term (current) drug therapy: Secondary | ICD-10-CM | POA: Diagnosis not present

## 2015-04-19 DIAGNOSIS — Z955 Presence of coronary angioplasty implant and graft: Secondary | ICD-10-CM | POA: Diagnosis not present

## 2015-04-19 LAB — GLUCOSE, CAPILLARY: Glucose-Capillary: 187 mg/dL — ABNORMAL HIGH (ref 65–99)

## 2015-04-21 ENCOUNTER — Encounter (HOSPITAL_COMMUNITY)
Admission: RE | Admit: 2015-04-21 | Discharge: 2015-04-21 | Disposition: A | Discharge status: Home / Self Care | Payer: Medicare Other | Source: Ambulatory Visit | Attending: Cardiology | Admitting: Cardiology

## 2015-04-21 DIAGNOSIS — Z955 Presence of coronary angioplasty implant and graft: Secondary | ICD-10-CM | POA: Diagnosis not present

## 2015-04-21 DIAGNOSIS — Z79899 Other long term (current) drug therapy: Secondary | ICD-10-CM | POA: Diagnosis not present

## 2015-04-23 ENCOUNTER — Encounter (HOSPITAL_COMMUNITY)
Admission: RE | Admit: 2015-04-23 | Discharge: 2015-04-23 | Disposition: A | Discharge status: Home / Self Care | Payer: Medicare Other | Source: Ambulatory Visit | Attending: Cardiology | Admitting: Cardiology

## 2015-04-23 DIAGNOSIS — Z79899 Other long term (current) drug therapy: Secondary | ICD-10-CM | POA: Diagnosis not present

## 2015-04-23 DIAGNOSIS — Z955 Presence of coronary angioplasty implant and graft: Secondary | ICD-10-CM | POA: Diagnosis not present

## 2015-04-26 ENCOUNTER — Encounter (HOSPITAL_COMMUNITY)
Admission: RE | Admit: 2015-04-26 | Discharge: 2015-04-26 | Disposition: A | Discharge status: Home / Self Care | Payer: Medicare Other | Source: Ambulatory Visit | Attending: Cardiology | Admitting: Cardiology

## 2015-04-26 DIAGNOSIS — Z955 Presence of coronary angioplasty implant and graft: Secondary | ICD-10-CM | POA: Diagnosis not present

## 2015-04-26 DIAGNOSIS — Z79899 Other long term (current) drug therapy: Secondary | ICD-10-CM | POA: Diagnosis not present

## 2015-04-28 ENCOUNTER — Encounter (HOSPITAL_COMMUNITY)
Admission: RE | Admit: 2015-04-28 | Discharge: 2015-04-28 | Disposition: A | Discharge status: Home / Self Care | Payer: Medicare Other | Source: Ambulatory Visit | Attending: Cardiology | Admitting: Cardiology

## 2015-04-28 DIAGNOSIS — Z955 Presence of coronary angioplasty implant and graft: Secondary | ICD-10-CM | POA: Diagnosis not present

## 2015-04-28 DIAGNOSIS — Z79899 Other long term (current) drug therapy: Secondary | ICD-10-CM | POA: Diagnosis not present

## 2015-04-30 ENCOUNTER — Encounter (HOSPITAL_COMMUNITY): Payer: Medicare Other

## 2015-05-05 ENCOUNTER — Encounter (HOSPITAL_COMMUNITY): Payer: Medicare Other

## 2015-05-05 ENCOUNTER — Telehealth (HOSPITAL_COMMUNITY): Payer: Self-pay | Admitting: Family Medicine

## 2015-05-07 ENCOUNTER — Encounter (HOSPITAL_COMMUNITY): Payer: Medicare Other

## 2015-05-07 ENCOUNTER — Telehealth (HOSPITAL_COMMUNITY): Payer: Self-pay | Admitting: Family Medicine

## 2015-05-12 ENCOUNTER — Encounter (HOSPITAL_COMMUNITY)
Admission: RE | Admit: 2015-05-12 | Discharge: 2015-05-12 | Disposition: A | Discharge status: Home / Self Care | Payer: Medicare Other | Source: Ambulatory Visit | Attending: Cardiology | Admitting: Cardiology

## 2015-05-12 DIAGNOSIS — Z79899 Other long term (current) drug therapy: Secondary | ICD-10-CM | POA: Diagnosis not present

## 2015-05-12 DIAGNOSIS — Z955 Presence of coronary angioplasty implant and graft: Secondary | ICD-10-CM | POA: Insufficient documentation

## 2015-05-13 LAB — GLUCOSE, CAPILLARY: Glucose-Capillary: 236 mg/dL — ABNORMAL HIGH (ref 65–99)

## 2015-05-14 ENCOUNTER — Encounter (HOSPITAL_COMMUNITY)
Admission: RE | Admit: 2015-05-14 | Discharge: 2015-05-14 | Disposition: A | Discharge status: Home / Self Care | Payer: Medicare Other | Source: Ambulatory Visit | Attending: Cardiology | Admitting: Cardiology

## 2015-05-14 DIAGNOSIS — Z955 Presence of coronary angioplasty implant and graft: Secondary | ICD-10-CM | POA: Diagnosis not present

## 2015-05-14 DIAGNOSIS — Z79899 Other long term (current) drug therapy: Secondary | ICD-10-CM | POA: Diagnosis not present

## 2015-05-17 ENCOUNTER — Encounter (HOSPITAL_COMMUNITY)
Admission: RE | Admit: 2015-05-17 | Discharge: 2015-05-17 | Disposition: A | Discharge status: Home / Self Care | Payer: Medicare Other | Source: Ambulatory Visit | Attending: Cardiology | Admitting: Cardiology

## 2015-05-17 DIAGNOSIS — Z955 Presence of coronary angioplasty implant and graft: Secondary | ICD-10-CM | POA: Diagnosis not present

## 2015-05-17 DIAGNOSIS — Z79899 Other long term (current) drug therapy: Secondary | ICD-10-CM | POA: Diagnosis not present

## 2015-05-19 ENCOUNTER — Encounter (HOSPITAL_COMMUNITY)
Admission: RE | Admit: 2015-05-19 | Discharge: 2015-05-19 | Disposition: A | Discharge status: Home / Self Care | Payer: Medicare Other | Source: Ambulatory Visit | Attending: Cardiology | Admitting: Cardiology

## 2015-05-19 DIAGNOSIS — Z955 Presence of coronary angioplasty implant and graft: Secondary | ICD-10-CM | POA: Diagnosis not present

## 2015-05-19 DIAGNOSIS — Z79899 Other long term (current) drug therapy: Secondary | ICD-10-CM | POA: Diagnosis not present

## 2015-05-19 NOTE — Progress Notes (Signed)
Gina Villarreal 70 y.o. female Nutrition Note Spoke with pt. Nutrition Plan and Nutrition Survey goals reviewed with pt. Pt is not following the Therapeutic Lifestyle Changes diet. Pt states she has not made any changes to her diet since her heart event. Pt wants to lose wt. Pt has been trying to lose wt by "trying to eat less." Wt loss tips reviewed. Pt is diabetic. No recent A1c noted. Pt reports last A1c was 7.6, which indicates blood glucose not optimally controlled. This Probation officer went over Diabetes Education test results. Pt checks CBG's once daily. Fasting CBG's reportedly "around 120 mg/dL." Pt expressed understanding of the information reviewed. Pt aware of nutrition education classes offered.  Nutrition Diagnosis ? Food-and nutrition-related knowledge deficit related to lack of exposure to information as related to diagnosis of: ? CVD ? DM ? Overweight related to excessive energy intake as evidenced by a BMI of 28.4  Nutrition RX/ Estimated Daily Nutrition Needs for: wt loss 1300-1550 Kcal, 35-40 gm fat, 9-12 gm sat fat, 1.2-1.5 gm trans-fat, <1500 mg sodium, 175 gm CHO   Nutrition Intervention ? Pt's individual nutrition plan reviewed with pt. ? Benefits of adopting Therapeutic Lifestyle Changes discussed when Medficts reviewed. ? Pt to attend the Portion Distortion class ? Pt to attend the Diabetes Q & A class ? Pt to attend the   ? Nutrition I class                  ? Nutrition II class     ? Diabetes Blitz class ? Pt given handouts for: ? Nutrition I class ? Nutrition II class ? Diabetes Blitz Class ? Continue client-centered nutrition education by RD, as part of interdisciplinary care. Goal(s) ? Pt to identify and limit food sources of saturated fat, trans fat, and cholesterol ? Pt to identify food quantities necessary to achieve: ? wt loss to a goal wt of 152-170 lb (69.4-77.6 kg) at graduation from cardiac rehab.  ? CBG concentrations in the normal range or as close to normal as  is safely possible. Monitor and Evaluate progress toward nutrition goal with team. Nutrition Risk: Change to Moderate Derek Mound, M.Ed, RD, LDN, CDE 05/19/2015 1:18 PM

## 2015-05-21 ENCOUNTER — Encounter (HOSPITAL_COMMUNITY)
Admission: RE | Admit: 2015-05-21 | Discharge: 2015-05-21 | Disposition: A | Discharge status: Home / Self Care | Payer: Medicare Other | Source: Ambulatory Visit | Attending: Cardiology | Admitting: Cardiology

## 2015-05-21 DIAGNOSIS — Z79899 Other long term (current) drug therapy: Secondary | ICD-10-CM | POA: Diagnosis not present

## 2015-05-21 DIAGNOSIS — Z955 Presence of coronary angioplasty implant and graft: Secondary | ICD-10-CM | POA: Diagnosis not present

## 2015-05-24 ENCOUNTER — Encounter (HOSPITAL_COMMUNITY)
Admission: RE | Admit: 2015-05-24 | Discharge: 2015-05-24 | Disposition: A | Discharge status: Home / Self Care | Payer: Medicare Other | Source: Ambulatory Visit | Attending: Cardiology | Admitting: Cardiology

## 2015-05-24 DIAGNOSIS — Z955 Presence of coronary angioplasty implant and graft: Secondary | ICD-10-CM | POA: Diagnosis not present

## 2015-05-24 DIAGNOSIS — Z79899 Other long term (current) drug therapy: Secondary | ICD-10-CM | POA: Diagnosis not present

## 2015-05-26 ENCOUNTER — Encounter (HOSPITAL_COMMUNITY)
Admission: RE | Admit: 2015-05-26 | Discharge: 2015-05-26 | Disposition: A | Discharge status: Home / Self Care | Payer: Medicare Other | Source: Ambulatory Visit | Attending: Cardiology | Admitting: Cardiology

## 2015-05-26 DIAGNOSIS — Z955 Presence of coronary angioplasty implant and graft: Secondary | ICD-10-CM | POA: Diagnosis not present

## 2015-05-26 DIAGNOSIS — Z79899 Other long term (current) drug therapy: Secondary | ICD-10-CM | POA: Diagnosis not present

## 2015-05-28 ENCOUNTER — Encounter (HOSPITAL_COMMUNITY)
Admission: RE | Admit: 2015-05-28 | Discharge: 2015-05-28 | Disposition: A | Discharge status: Home / Self Care | Payer: Medicare Other | Source: Ambulatory Visit | Attending: Cardiology | Admitting: Cardiology

## 2015-05-28 DIAGNOSIS — Z79899 Other long term (current) drug therapy: Secondary | ICD-10-CM | POA: Diagnosis not present

## 2015-05-28 DIAGNOSIS — Z955 Presence of coronary angioplasty implant and graft: Secondary | ICD-10-CM | POA: Diagnosis not present

## 2015-05-31 ENCOUNTER — Encounter (HOSPITAL_COMMUNITY)
Admission: RE | Admit: 2015-05-31 | Discharge: 2015-05-31 | Disposition: A | Discharge status: Home / Self Care | Payer: Medicare Other | Source: Ambulatory Visit | Attending: Cardiology | Admitting: Cardiology

## 2015-05-31 DIAGNOSIS — Z79899 Other long term (current) drug therapy: Secondary | ICD-10-CM | POA: Diagnosis not present

## 2015-05-31 DIAGNOSIS — Z955 Presence of coronary angioplasty implant and graft: Secondary | ICD-10-CM | POA: Diagnosis not present

## 2015-06-01 DIAGNOSIS — E039 Hypothyroidism, unspecified: Secondary | ICD-10-CM | POA: Diagnosis not present

## 2015-06-01 DIAGNOSIS — I2510 Atherosclerotic heart disease of native coronary artery without angina pectoris: Secondary | ICD-10-CM | POA: Diagnosis not present

## 2015-06-01 DIAGNOSIS — Z7984 Long term (current) use of oral hypoglycemic drugs: Secondary | ICD-10-CM | POA: Diagnosis not present

## 2015-06-01 DIAGNOSIS — E1165 Type 2 diabetes mellitus with hyperglycemia: Secondary | ICD-10-CM | POA: Diagnosis not present

## 2015-06-01 DIAGNOSIS — E785 Hyperlipidemia, unspecified: Secondary | ICD-10-CM | POA: Diagnosis not present

## 2015-06-01 DIAGNOSIS — I10 Essential (primary) hypertension: Secondary | ICD-10-CM | POA: Diagnosis not present

## 2015-06-02 ENCOUNTER — Encounter (HOSPITAL_COMMUNITY)
Admission: RE | Admit: 2015-06-02 | Discharge: 2015-06-02 | Disposition: A | Discharge status: Home / Self Care | Payer: Medicare Other | Source: Ambulatory Visit | Attending: Cardiology | Admitting: Cardiology

## 2015-06-02 DIAGNOSIS — Z955 Presence of coronary angioplasty implant and graft: Secondary | ICD-10-CM | POA: Diagnosis not present

## 2015-06-02 DIAGNOSIS — Z79899 Other long term (current) drug therapy: Secondary | ICD-10-CM | POA: Diagnosis not present

## 2015-06-04 ENCOUNTER — Encounter (HOSPITAL_COMMUNITY)
Admission: RE | Admit: 2015-06-04 | Discharge: 2015-06-04 | Disposition: A | Discharge status: Home / Self Care | Payer: Medicare Other | Source: Ambulatory Visit | Attending: Cardiology | Admitting: Cardiology

## 2015-06-04 DIAGNOSIS — Z79899 Other long term (current) drug therapy: Secondary | ICD-10-CM | POA: Diagnosis not present

## 2015-06-04 DIAGNOSIS — Z955 Presence of coronary angioplasty implant and graft: Secondary | ICD-10-CM | POA: Diagnosis not present

## 2015-06-07 ENCOUNTER — Encounter (HOSPITAL_COMMUNITY)
Admission: RE | Admit: 2015-06-07 | Discharge: 2015-06-07 | Disposition: A | Discharge status: Home / Self Care | Payer: Medicare Other | Source: Ambulatory Visit | Attending: Cardiology | Admitting: Cardiology

## 2015-06-07 DIAGNOSIS — Z79899 Other long term (current) drug therapy: Secondary | ICD-10-CM | POA: Diagnosis not present

## 2015-06-07 DIAGNOSIS — Z955 Presence of coronary angioplasty implant and graft: Secondary | ICD-10-CM | POA: Diagnosis not present

## 2015-06-09 ENCOUNTER — Encounter (HOSPITAL_COMMUNITY)
Admission: RE | Admit: 2015-06-09 | Discharge: 2015-06-09 | Disposition: A | Discharge status: Home / Self Care | Payer: Medicare Other | Source: Ambulatory Visit | Attending: Cardiology | Admitting: Cardiology

## 2015-06-09 DIAGNOSIS — Z79899 Other long term (current) drug therapy: Secondary | ICD-10-CM | POA: Diagnosis not present

## 2015-06-09 DIAGNOSIS — Z955 Presence of coronary angioplasty implant and graft: Secondary | ICD-10-CM | POA: Insufficient documentation

## 2015-06-11 ENCOUNTER — Encounter (HOSPITAL_COMMUNITY)
Admission: RE | Admit: 2015-06-11 | Discharge: 2015-06-11 | Disposition: A | Discharge status: Home / Self Care | Payer: Medicare Other | Source: Ambulatory Visit | Attending: Cardiology | Admitting: Cardiology

## 2015-06-11 DIAGNOSIS — Z955 Presence of coronary angioplasty implant and graft: Secondary | ICD-10-CM | POA: Diagnosis not present

## 2015-06-11 DIAGNOSIS — Z79899 Other long term (current) drug therapy: Secondary | ICD-10-CM | POA: Diagnosis not present

## 2015-06-14 ENCOUNTER — Encounter (HOSPITAL_COMMUNITY)
Admission: RE | Admit: 2015-06-14 | Discharge: 2015-06-14 | Disposition: A | Discharge status: Home / Self Care | Payer: Medicare Other | Source: Ambulatory Visit | Attending: Cardiology | Admitting: Cardiology

## 2015-06-14 DIAGNOSIS — Z79899 Other long term (current) drug therapy: Secondary | ICD-10-CM | POA: Diagnosis not present

## 2015-06-14 DIAGNOSIS — Z955 Presence of coronary angioplasty implant and graft: Secondary | ICD-10-CM | POA: Diagnosis not present

## 2015-06-16 ENCOUNTER — Encounter (HOSPITAL_COMMUNITY)
Admission: RE | Admit: 2015-06-16 | Discharge: 2015-06-16 | Disposition: A | Discharge status: Home / Self Care | Payer: Medicare Other | Source: Ambulatory Visit | Attending: Cardiology | Admitting: Cardiology

## 2015-06-16 DIAGNOSIS — Z79899 Other long term (current) drug therapy: Secondary | ICD-10-CM | POA: Diagnosis not present

## 2015-06-16 DIAGNOSIS — Z955 Presence of coronary angioplasty implant and graft: Secondary | ICD-10-CM | POA: Diagnosis not present

## 2015-06-16 LAB — GLUCOSE, CAPILLARY: Glucose-Capillary: 156 mg/dL — ABNORMAL HIGH (ref 65–99)

## 2015-06-18 ENCOUNTER — Encounter (HOSPITAL_COMMUNITY)
Admission: RE | Admit: 2015-06-18 | Discharge: 2015-06-18 | Disposition: A | Discharge status: Home / Self Care | Payer: Medicare Other | Source: Ambulatory Visit | Attending: Cardiology | Admitting: Cardiology

## 2015-06-18 DIAGNOSIS — Z955 Presence of coronary angioplasty implant and graft: Secondary | ICD-10-CM | POA: Diagnosis not present

## 2015-06-18 DIAGNOSIS — Z79899 Other long term (current) drug therapy: Secondary | ICD-10-CM | POA: Diagnosis not present

## 2015-06-21 ENCOUNTER — Encounter (HOSPITAL_COMMUNITY)
Admission: RE | Admit: 2015-06-21 | Discharge: 2015-06-21 | Disposition: A | Discharge status: Home / Self Care | Payer: Medicare Other | Source: Ambulatory Visit | Attending: Cardiology | Admitting: Cardiology

## 2015-06-21 DIAGNOSIS — Z79899 Other long term (current) drug therapy: Secondary | ICD-10-CM | POA: Diagnosis not present

## 2015-06-21 DIAGNOSIS — Z955 Presence of coronary angioplasty implant and graft: Secondary | ICD-10-CM | POA: Diagnosis not present

## 2015-06-22 ENCOUNTER — Ambulatory Visit: Payer: Medicare Other | Admitting: Cardiology

## 2015-06-23 ENCOUNTER — Encounter (HOSPITAL_COMMUNITY)
Admission: RE | Admit: 2015-06-23 | Discharge: 2015-06-23 | Disposition: A | Discharge status: Home / Self Care | Payer: Medicare Other | Source: Ambulatory Visit | Attending: Cardiology | Admitting: Cardiology

## 2015-06-23 DIAGNOSIS — Z955 Presence of coronary angioplasty implant and graft: Secondary | ICD-10-CM | POA: Diagnosis not present

## 2015-06-23 DIAGNOSIS — Z79899 Other long term (current) drug therapy: Secondary | ICD-10-CM | POA: Diagnosis not present

## 2015-06-25 ENCOUNTER — Encounter (HOSPITAL_COMMUNITY)
Admission: RE | Admit: 2015-06-25 | Discharge: 2015-06-25 | Disposition: A | Discharge status: Home / Self Care | Payer: Medicare Other | Source: Ambulatory Visit | Attending: Cardiology | Admitting: Cardiology

## 2015-06-25 DIAGNOSIS — Z955 Presence of coronary angioplasty implant and graft: Secondary | ICD-10-CM | POA: Diagnosis not present

## 2015-06-25 DIAGNOSIS — Z79899 Other long term (current) drug therapy: Secondary | ICD-10-CM | POA: Diagnosis not present

## 2015-06-28 ENCOUNTER — Encounter (HOSPITAL_COMMUNITY)
Admission: RE | Admit: 2015-06-28 | Discharge: 2015-06-28 | Disposition: A | Discharge status: Home / Self Care | Payer: Medicare Other | Source: Ambulatory Visit | Attending: Cardiology | Admitting: Cardiology

## 2015-06-28 DIAGNOSIS — E113392 Type 2 diabetes mellitus with moderate nonproliferative diabetic retinopathy without macular edema, left eye: Secondary | ICD-10-CM | POA: Diagnosis not present

## 2015-06-28 DIAGNOSIS — H04123 Dry eye syndrome of bilateral lacrimal glands: Secondary | ICD-10-CM | POA: Diagnosis not present

## 2015-06-28 DIAGNOSIS — H2513 Age-related nuclear cataract, bilateral: Secondary | ICD-10-CM | POA: Diagnosis not present

## 2015-06-28 DIAGNOSIS — Z79899 Other long term (current) drug therapy: Secondary | ICD-10-CM | POA: Diagnosis not present

## 2015-06-28 DIAGNOSIS — E113291 Type 2 diabetes mellitus with mild nonproliferative diabetic retinopathy without macular edema, right eye: Secondary | ICD-10-CM | POA: Diagnosis not present

## 2015-06-28 DIAGNOSIS — Z955 Presence of coronary angioplasty implant and graft: Secondary | ICD-10-CM | POA: Diagnosis not present

## 2015-06-30 ENCOUNTER — Encounter (HOSPITAL_COMMUNITY)
Admission: RE | Admit: 2015-06-30 | Discharge: 2015-06-30 | Disposition: A | Discharge status: Home / Self Care | Payer: Medicare Other | Source: Ambulatory Visit | Attending: Cardiology | Admitting: Cardiology

## 2015-06-30 DIAGNOSIS — Z79899 Other long term (current) drug therapy: Secondary | ICD-10-CM | POA: Diagnosis not present

## 2015-06-30 DIAGNOSIS — Z955 Presence of coronary angioplasty implant and graft: Secondary | ICD-10-CM | POA: Diagnosis not present

## 2015-07-02 ENCOUNTER — Encounter (HOSPITAL_COMMUNITY)
Admission: RE | Admit: 2015-07-02 | Discharge: 2015-07-02 | Disposition: A | Discharge status: Home / Self Care | Payer: Medicare Other | Source: Ambulatory Visit | Attending: Cardiology | Admitting: Cardiology

## 2015-07-02 DIAGNOSIS — Z79899 Other long term (current) drug therapy: Secondary | ICD-10-CM | POA: Diagnosis not present

## 2015-07-02 DIAGNOSIS — Z955 Presence of coronary angioplasty implant and graft: Secondary | ICD-10-CM | POA: Diagnosis not present

## 2015-07-05 ENCOUNTER — Encounter (HOSPITAL_COMMUNITY)
Admission: RE | Admit: 2015-07-05 | Discharge: 2015-07-05 | Disposition: A | Discharge status: Home / Self Care | Payer: Medicare Other | Source: Ambulatory Visit | Attending: Cardiology | Admitting: Cardiology

## 2015-07-05 DIAGNOSIS — Z79899 Other long term (current) drug therapy: Secondary | ICD-10-CM | POA: Diagnosis not present

## 2015-07-05 DIAGNOSIS — Z955 Presence of coronary angioplasty implant and graft: Secondary | ICD-10-CM | POA: Diagnosis not present

## 2015-07-07 ENCOUNTER — Encounter (HOSPITAL_COMMUNITY)
Admission: RE | Admit: 2015-07-07 | Discharge: 2015-07-07 | Disposition: A | Discharge status: Home / Self Care | Payer: Medicare Other | Source: Ambulatory Visit | Attending: Cardiology | Admitting: Cardiology

## 2015-07-07 DIAGNOSIS — Z955 Presence of coronary angioplasty implant and graft: Secondary | ICD-10-CM | POA: Insufficient documentation

## 2015-07-07 DIAGNOSIS — Z79899 Other long term (current) drug therapy: Secondary | ICD-10-CM | POA: Insufficient documentation

## 2015-07-07 NOTE — Progress Notes (Signed)
Pt graduated from cardiac rehab program today with completion of 36 exercise sessions in Phase II. Pt maintained good attendance to exercise and education classes. Pt progressed nicely during her participation in rehab as evidenced by increased MET level. Pt increased from 2.6 to 3.4.   Medication list reconciled. Repeat  PHQ score- 0 . Pt remains upbeat and has a positive outlook on life. Pt with positive and healthy coping skills.  Pt has made significant lifestyle changes and should be commended for her success. Pt with improved diabetes management with improved blood glucose readings. Pt feels she has achieved her short goal during cardiac rehab. Pt has improved shortness of breath and increase muscle tone. Pt has made progress toward her long term goal.  Pt desired to lose 10 pounds.  Pt lost about 5 pounds during her participation.  Pt feels she is equipped with the tools that is needed to be successful with weight loss.   Pt plans to continue exercise by returning to the gym for aerobic class three times a week and walking the other days.  It was a true pleasure to have this patient participate in cardiac rehab. Cherre Huger, BSN

## 2015-07-09 ENCOUNTER — Encounter (HOSPITAL_COMMUNITY): Payer: Medicare Other

## 2015-08-23 ENCOUNTER — Encounter: Payer: Self-pay | Admitting: Cardiology

## 2015-08-23 ENCOUNTER — Ambulatory Visit (INDEPENDENT_AMBULATORY_CARE_PROVIDER_SITE_OTHER): Payer: Medicare Other | Admitting: Cardiology

## 2015-08-23 VITALS — BP 130/70 | HR 68 | Ht 66.0 in | Wt 176.5 lb

## 2015-08-23 DIAGNOSIS — E119 Type 2 diabetes mellitus without complications: Secondary | ICD-10-CM

## 2015-08-23 DIAGNOSIS — E785 Hyperlipidemia, unspecified: Secondary | ICD-10-CM | POA: Diagnosis not present

## 2015-08-23 DIAGNOSIS — I10 Essential (primary) hypertension: Secondary | ICD-10-CM | POA: Diagnosis not present

## 2015-08-23 DIAGNOSIS — I2583 Coronary atherosclerosis due to lipid rich plaque: Principal | ICD-10-CM

## 2015-08-23 DIAGNOSIS — I2510 Atherosclerotic heart disease of native coronary artery without angina pectoris: Secondary | ICD-10-CM | POA: Diagnosis not present

## 2015-08-23 NOTE — Progress Notes (Signed)
Cardiology Office Note   Date:  08/23/2015   ID:  Gina Villarreal, DOB December 30, 1945, MRN [id number]  PCP:  Shirline Frees, MD  Cardiologist:   Peter Martinique, MD   No chief complaint on file.     History of Present Illness: Gina Villarreal is a 70 y.o. female  With CAD s/p PCI of the RCA in 2003, hypertension, hyperlipidemia and DM type 2.  She was seen in September 2016 with progressive angina. She underwent cardiac cath on September 26. This demonstrated severe 2 vessel CAD with patent stent in RCA. There was a 90% stenosis in the mid LAD followed by a long segment of 45% disease in the mid to distal vessel. There was also a 90% stenosis in the first OM. The mid LAD and first OM were stented successfully with DES. She is intolerant of beta blocker due to SOB and fatigue.  On follow up today she is doing very well. No chest pain or SOB. Still exercising regularly doing aerobics 3x/wk. No edema or palpitations.   Past Medical History  Diagnosis Date  . Coronary artery disease     a. 2003- BMS to RCA b. 01/2015- DES to mid LAD c. DES to mid-OM1  . Combined hyperlipidemia   . GERD (gastroesophageal reflux disease)   . Type II diabetes mellitus ([place])   . Hypertension   . Anginal pain ([place])   . OSA on CPAP   . Hypothyroidism   . 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 Arthritis     "fingers" (02/01/2015)  . Rectal cancer ([place]) 2005  . Squamous cell carcinoma of right foot   . Melanoma ([place])     "back; left lateral knee"    Past Surgical History  Procedure Laterality Date  . Right oophorectomy Right 1960's  . Colectomy  2005    "rectal"  . Appendectomy  1960's  . Tonsillectomy  1950's  . Laparoscopic cholecystectomy  1970's  . Breast cyst aspiration Right 1980's  . Breast lumpectomy Right 1980's  . Melanoma excision      "back; left lateral knee"  . Squamous cell carcinoma excision Right     "foot"  . Cardiac catheterization N/A 02/01/2015    Procedure: Left Heart Cath and  Coronary Angiography;  Surgeon: Peter M Martinique, MD;  Location: [place] [facility];  Service: Cardiovascular;  Laterality: N/A;  . Cardiac catheterization  02/01/2015    Procedure: Coronary Stent Intervention;  Surgeon: Peter M Martinique, MD;  Location: [place] [facility];  Service: Cardiovascular;;  . Coronary angioplasty with stent placement  2003; 2016    a. BMS to RCA in 2003. b. DES to mid LAD and DES to mid OM1 on 02/01/2015     Current Outpatient Prescriptions  Medication Sig Dispense Refill  . aspirin 81 MG chewable tablet Chew 1 tablet (81 mg total) by mouth daily.    . clopidogrel (PLAVIX) 75 MG tablet Take 1 tablet (75 mg total) by mouth daily. 90 tablet 2  . glimepiride (AMARYL) 4 MG tablet Take 4 mg by mouth 2 (two) times daily.    Marland Kitchen levothyroxine (SYNTHROID, LEVOTHROID) 175 MCG tablet Take 175 mcg by mouth daily.  0  . lisinopril-hydrochlorothiazide (PRINZIDE,ZESTORETIC) 10-12.5 MG tablet Take 1 tablet by mouth daily.    . metFORMIN (GLUCOPHAGE) 1000 MG tablet Take 1,000 mg by mouth 2 (two) times daily with a meal.    . nitroGLYCERIN (NITROSTAT) 0.4 MG SL tablet Place 1 tablet (  0.4 mg total) under the tongue every 5 (five) minutes as needed for chest pain. 25 tablet 5  . pravastatin (PRAVACHOL) 40 MG tablet Take 40 mg by mouth 2 (two) times daily.     No current facility-administered medications for this visit.    Allergies:   Metoprolol and Morphine and related    Social History:  The patient  reports that she quit smoking about 14 years ago. Her smoking use included Cigarettes. She has a 40 pack-year smoking history. She has never used smokeless tobacco. She reports that she drinks alcohol. She reports that she does not use illicit drugs.   Family History:  The patient's family history includes Heart attack in her father.    ROS:  Please see the history of present illness.   Otherwise, review of systems are positive for none.   All other systems are reviewed and negative.     PHYSICAL EXAM: VS:  BP 130/70 mmHg  Pulse 68  Ht 5\' 6"  (1.676 m)  Wt 80.06 kg (176 lb 8 oz)  BMI 28.50 kg/m2 , BMI Body mass index is 28.5 kg/(m^2). GENERAL:  Well appearing WF in NAD HEENT:  Pupils equal round and reactive, fundi not visualized, oral mucosa unremarkable NECK:  No jugular venous distention, waveform within normal limits, carotid upstroke brisk and symmetric, no bruits, no thyromegaly LYMPHATICS:  No cervical adenopathy LUNGS:  Clear to auscultation bilaterally HEART:  RRR.  PMI not displaced or sustained,S1 and S2 within normal limits, no S3, no S4, no clicks, no rubs, no murmurs ABD:  Flat, positive bowel sounds normal in frequency in pitch, no bruits, no rebound, no guarding, no midline pulsatile mass, no hepatomegaly, no splenomegaly EXT:  2 plus pulses throughout, no edema, no cyanosis no clubbing SKIN:  No rashes no nodules NEURO:  Cranial nerves II through XII grossly intact, motor grossly intact throughout PSYCH:  Cognitively intact, oriented to person place and time    EKG:  EKG is not ordered today.    Recent Labs: 02/02/2015: BUN 16; Creatinine, Ser 0.75; Hemoglobin 11.8*; Platelets 243; Potassium 4.7; Sodium 137    Lipid Panel No results found for: CHOL, TRIG, HDL, CHOLHDL, VLDL, LDLCALC, LDLDIRECT    Wt Readings from Last 3 Encounters:  08/23/15 80.06 kg (176 lb 8 oz)  03/30/15 80.3 kg (177 lb 0.5 oz)  03/18/15 79.946 kg (176 lb 4 oz)     Other studies Reviewed: Additional studies/ records that were reviewed today include: . Review of the above records demonstrates:  Please see elsewhere in the note.     ASSESSMENT AND PLAN:  1. CAD s/p remote stenting of the RCA in 2003. S/p DES stenting of the LAD and first OM in September 2016. Anginal symptoms have resolved and she is asymptomatic. Continue DAPT for one year.  2. HTN well controlled. 3. Hyperlipidemia controlled on pravastatin. Labs followed by Dr. Kenton Kingfisher 4. DM on oral  therapy.   Current medicines are reviewed at length with the patient today.  The patient does not have concerns regarding medicines.  The following changes have been made:  no change  Labs/ tests ordered today include:   No orders of the defined types were placed in this encounter.   Refer to Cardiac Rehab.  Disposition:   FU with me in 6 months.  Signed, Peter Martinique, MD  08/23/2015 2:19 PM    [facility]

## 2015-08-23 NOTE — Patient Instructions (Signed)
Continue your current therapy  I will see you in 6 months.   

## 2015-09-10 ENCOUNTER — Other Ambulatory Visit: Payer: Self-pay

## 2015-09-10 DIAGNOSIS — Z1231 Encounter for screening mammogram for malignant neoplasm of breast: Secondary | ICD-10-CM

## 2015-09-23 ENCOUNTER — Ambulatory Visit
Admission: RE | Admit: 2015-09-23 | Discharge: 2015-09-23 | Disposition: A | Discharge status: Home / Self Care | Payer: Medicare Other | Source: Ambulatory Visit

## 2015-09-23 DIAGNOSIS — D485 Neoplasm of uncertain behavior of skin: Secondary | ICD-10-CM | POA: Diagnosis not present

## 2015-09-23 DIAGNOSIS — D1801 Hemangioma of skin and subcutaneous tissue: Secondary | ICD-10-CM | POA: Diagnosis not present

## 2015-09-23 DIAGNOSIS — D235 Other benign neoplasm of skin of trunk: Secondary | ICD-10-CM | POA: Diagnosis not present

## 2015-09-23 DIAGNOSIS — L814 Other melanin hyperpigmentation: Secondary | ICD-10-CM | POA: Diagnosis not present

## 2015-09-23 DIAGNOSIS — Z85820 Personal history of malignant melanoma of skin: Secondary | ICD-10-CM | POA: Diagnosis not present

## 2015-09-23 DIAGNOSIS — L820 Inflamed seborrheic keratosis: Secondary | ICD-10-CM | POA: Diagnosis not present

## 2015-09-23 DIAGNOSIS — D2271 Melanocytic nevi of right lower limb, including hip: Secondary | ICD-10-CM | POA: Diagnosis not present

## 2015-09-23 DIAGNOSIS — Z1231 Encounter for screening mammogram for malignant neoplasm of breast: Secondary | ICD-10-CM

## 2015-09-29 DIAGNOSIS — E113392 Type 2 diabetes mellitus with moderate nonproliferative diabetic retinopathy without macular edema, left eye: Secondary | ICD-10-CM | POA: Diagnosis not present

## 2015-09-29 DIAGNOSIS — E113291 Type 2 diabetes mellitus with mild nonproliferative diabetic retinopathy without macular edema, right eye: Secondary | ICD-10-CM | POA: Diagnosis not present

## 2015-10-12 ENCOUNTER — Encounter: Payer: Self-pay | Admitting: *Deleted

## 2015-10-22 DIAGNOSIS — E1165 Type 2 diabetes mellitus with hyperglycemia: Secondary | ICD-10-CM | POA: Diagnosis not present

## 2015-10-22 DIAGNOSIS — I10 Essential (primary) hypertension: Secondary | ICD-10-CM | POA: Diagnosis not present

## 2015-10-22 DIAGNOSIS — E782 Mixed hyperlipidemia: Secondary | ICD-10-CM | POA: Diagnosis not present

## 2015-10-22 DIAGNOSIS — E039 Hypothyroidism, unspecified: Secondary | ICD-10-CM | POA: Diagnosis not present

## 2015-11-17 DIAGNOSIS — G4733 Obstructive sleep apnea (adult) (pediatric): Secondary | ICD-10-CM | POA: Diagnosis not present

## 2016-03-23 DIAGNOSIS — Z23 Encounter for immunization: Secondary | ICD-10-CM | POA: Diagnosis not present

## 2016-03-25 NOTE — Progress Notes (Signed)
Cardiology Office Note   Date:  03/27/2016   ID:  DEVYN Villarreal, DOB 1946-03-27, MRN [id number]  PCP:  Shirline Frees, MD  Cardiologist:   Loraine Bhullar Martinique, MD   Chief Complaint  Patient presents with  . Follow-up    6 months    . Coronary Artery Disease      History of Present Illness: Gina Villarreal is a 70 y.o. female  With CAD s/p PCI of the RCA in 2003, hypertension, hyperlipidemia and DM type 2.  She was seen in September 2016 with progressive angina. She underwent cardiac cath on September 26. This demonstrated severe 2 vessel CAD with patent stent in RCA. There was a 90% stenosis in the mid LAD followed by a long segment of 45% disease in the mid to distal vessel. There was also a 90% stenosis in the first OM. The mid LAD and first OM were stented successfully with DES. She is intolerant of beta blocker due to SOB and fatigue.   On follow up today she is doing very well. No chest pain or SOB. Still exercising regularly doing aerobics 3x/wk. No edema or palpitations.   Past Medical History:  Diagnosis Date  . Anginal pain ([place])   . Arthritis    "fingers" (02/01/2015)  . 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 Combined hyperlipidemia   . Coronary artery disease    a. 2003- BMS to RCA b. 01/2015- DES to mid LAD c. DES to mid-OM1  . GERD (gastroesophageal reflux disease)   . Hypertension   . Hypothyroidism   . Melanoma ([place])    "back; left lateral knee"  . OSA on CPAP   . Rectal cancer ([place]) 2005  . Squamous cell carcinoma of right foot   . Type II diabetes mellitus ([place])     Past Surgical History:  Procedure Laterality Date  . APPENDECTOMY  1960's  . BREAST CYST ASPIRATION Right 1980's  . BREAST LUMPECTOMY Right 1980's  . CARDIAC CATHETERIZATION N/A 02/01/2015   Procedure: Left Heart Cath and Coronary Angiography;  Surgeon: Shuan Statzer M Martinique, MD;  Location: [place] [facility];  Service: Cardiovascular;  Laterality: N/A;  . CARDIAC CATHETERIZATION  02/01/2015   Procedure: Coronary Stent Intervention;  Surgeon: Dimas Scheck M Martinique, MD;  Location: [place] [facility];  Service: Cardiovascular;;  . COLECTOMY  2005   "rectal"  . CORONARY ANGIOPLASTY WITH STENT PLACEMENT  2003; 2016   a. BMS to RCA in 2003. b. DES to mid LAD and DES to mid OM1 on 02/01/2015  . LAPAROSCOPIC CHOLECYSTECTOMY  1970's  . MELANOMA EXCISION     "back; left lateral knee"  . RIGHT OOPHORECTOMY Right 1960's  . SQUAMOUS CELL CARCINOMA EXCISION Right    "foot"  . TONSILLECTOMY  1950's     Current Outpatient Prescriptions  Medication Sig Dispense Refill  . aspirin 81 MG chewable tablet Chew 1 tablet (81 mg total) by mouth daily.    Marland Kitchen glimepiride (AMARYL) 4 MG tablet Take 4 mg by mouth 2 (two) times daily.    Marland Kitchen GLUCOSAMINE-CHONDROITIN PO Take 1 tablet by mouth daily. 1500-1200 mg    . levothyroxine (SYNTHROID, LEVOTHROID) 175 MCG tablet Take 175 mcg by mouth daily.  0  . lisinopril-hydrochlorothiazide (PRINZIDE,ZESTORETIC) 10-12.5 MG tablet Take 1 tablet by mouth daily.    . metFORMIN (GLUCOPHAGE) 1000 MG tablet Take 1,000 mg by mouth 2 (two) times daily with a meal.    . nitroGLYCERIN (NITROSTAT) 0.4 MG SL tablet  Place 1 tablet (0.4 mg total) under the tongue every 5 (five) minutes as needed for chest pain. 25 tablet 5  . pravastatin (PRAVACHOL) 40 MG tablet Take 40 mg by mouth 2 (two) times daily.     No current facility-administered medications for this visit.     Allergies:   Metoprolol and Morphine and related    Social History:  The patient  reports that she quit smoking about 14 years ago. Her smoking use included Cigarettes. She has a 40.00 pack-year smoking history. She has never used smokeless tobacco. She reports that she drinks alcohol. She reports that she does not use drugs.   Family History:  The patient's family history includes Heart attack in her father.    ROS:  Please see the history of present illness.   Otherwise, review of systems are positive for none.    All other systems are reviewed and negative.    PHYSICAL EXAM: VS:  BP 112/66 (BP Location: Left Arm, Patient Position: Sitting, Cuff Size: Normal)   Pulse 65   Ht 5\' 6"  (1.676 m)   Wt 176 lb 3.2 oz (79.9 kg)   BMI 28.44 kg/m  , BMI Body mass index is 28.44 kg/m. GENERAL:  Well appearing WF in NAD HEENT:  Pupils equal round and reactive, fundi not visualized, oral mucosa unremarkable NECK:  No jugular venous distention, waveform within normal limits, carotid upstroke brisk and symmetric, no bruits, no thyromegaly LYMPHATICS:  No cervical adenopathy LUNGS:  Clear to auscultation bilaterally HEART:  RRR.  PMI not displaced or sustained,S1 and S2 within normal limits, no S3, no S4, no clicks, no rubs, no murmurs ABD:  Flat, positive bowel sounds normal in frequency in pitch, no bruits, no rebound, no guarding, no midline pulsatile mass, no hepatomegaly, no splenomegaly EXT:  2 plus pulses throughout, no edema, no cyanosis no clubbing SKIN:  No rashes no nodules NEURO:  Cranial nerves II through XII grossly intact, motor grossly intact throughout PSYCH:  Cognitively intact, oriented to person place and time    EKG:  EKG is  ordered today. It shows NSR with normal Ecg. I have personally reviewed and interpreted this study.     Recent Labs: No results found for requested labs within last 8760 hours.    Lipid Panel No results found for: CHOL, TRIG, HDL, CHOLHDL, VLDL, LDLCALC, LDLDIRECT    Wt Readings from Last 3 Encounters:  03/27/16 176 lb 3.2 oz (79.9 kg)  08/23/15 176 lb 8 oz (80.1 kg)  03/30/15 177 lb 0.5 oz (80.3 kg)     Other studies Reviewed: Additional studies/ records that were reviewed today include: .Labs dated 10/22/15 Review of the above records demonstrates:  Cholesterol 205, triglycerides 345, HDL 46, LDL 89. A1c 8.9%.   ASSESSMENT AND PLAN:  1. CAD s/p remote stenting of the RCA in 2003. S/p DES stenting of the LAD and first OM in September 2016. She is  asymptomatic. Will stop Plavix now. Continue ASA 81 mg daily.  2. HTN well controlled. 3. Hyperlipidemia  on pravastatin. Triglycerides elevated. Needs to focus on diabetic control, weight loss, and exercise.  4. DM on oral therapy.   Current medicines are reviewed at length with the patient today.  The patient does not have concerns regarding medicines.  The following changes have been made:  no change  Labs/ tests ordered today include:   Orders Placed This Encounter  Procedures  . EKG 12-Lead    Disposition:   FU with  me in 6 months.  Signed, Breton Berns Martinique, MD  03/27/2016 3:42 PM    [place]

## 2016-03-27 ENCOUNTER — Encounter: Payer: Self-pay | Admitting: Cardiology

## 2016-03-27 ENCOUNTER — Ambulatory Visit (INDEPENDENT_AMBULATORY_CARE_PROVIDER_SITE_OTHER): Payer: Medicare Other | Admitting: Cardiology

## 2016-03-27 VITALS — BP 112/66 | HR 65 | Ht 66.0 in | Wt 176.2 lb

## 2016-03-27 DIAGNOSIS — I2583 Coronary atherosclerosis due to lipid rich plaque: Secondary | ICD-10-CM | POA: Diagnosis not present

## 2016-03-27 DIAGNOSIS — I2510 Atherosclerotic heart disease of native coronary artery without angina pectoris: Secondary | ICD-10-CM

## 2016-03-27 DIAGNOSIS — E7800 Pure hypercholesterolemia, unspecified: Secondary | ICD-10-CM

## 2016-03-27 DIAGNOSIS — I10 Essential (primary) hypertension: Secondary | ICD-10-CM | POA: Diagnosis not present

## 2016-03-27 NOTE — Patient Instructions (Signed)
You may stop Plavix  Continue your other therapy

## 2016-04-12 DIAGNOSIS — E113392 Type 2 diabetes mellitus with moderate nonproliferative diabetic retinopathy without macular edema, left eye: Secondary | ICD-10-CM | POA: Diagnosis not present

## 2016-04-12 DIAGNOSIS — E113291 Type 2 diabetes mellitus with mild nonproliferative diabetic retinopathy without macular edema, right eye: Secondary | ICD-10-CM | POA: Diagnosis not present

## 2016-09-13 ENCOUNTER — Other Ambulatory Visit: Payer: Self-pay | Admitting: Family Medicine

## 2016-09-13 DIAGNOSIS — Z1231 Encounter for screening mammogram for malignant neoplasm of breast: Secondary | ICD-10-CM

## 2016-10-11 ENCOUNTER — Ambulatory Visit
Admission: RE | Admit: 2016-10-11 | Discharge: 2016-10-11 | Disposition: A | Discharge status: Home / Self Care | Payer: Medicare Other | Source: Ambulatory Visit | Attending: Family Medicine | Admitting: Family Medicine

## 2016-10-11 DIAGNOSIS — Z1231 Encounter for screening mammogram for malignant neoplasm of breast: Secondary | ICD-10-CM

## 2017-01-03 ENCOUNTER — Other Ambulatory Visit: Payer: Self-pay | Admitting: Family Medicine

## 2017-01-03 DIAGNOSIS — Z136 Encounter for screening for cardiovascular disorders: Secondary | ICD-10-CM

## 2017-01-11 ENCOUNTER — Ambulatory Visit
Admission: RE | Admit: 2017-01-11 | Discharge: 2017-01-11 | Disposition: A | Discharge status: Home / Self Care | Payer: Medicare Other | Source: Ambulatory Visit | Attending: Family Medicine | Admitting: Family Medicine

## 2017-01-11 DIAGNOSIS — Z136 Encounter for screening for cardiovascular disorders: Secondary | ICD-10-CM

## 2017-10-29 ENCOUNTER — Other Ambulatory Visit: Payer: Self-pay | Admitting: Family Medicine

## 2017-10-29 DIAGNOSIS — Z1231 Encounter for screening mammogram for malignant neoplasm of breast: Secondary | ICD-10-CM

## 2017-11-23 ENCOUNTER — Ambulatory Visit
Admission: RE | Admit: 2017-11-23 | Discharge: 2017-11-23 | Disposition: A | Discharge status: Home / Self Care | Payer: Medicare Other | Source: Ambulatory Visit | Attending: Family Medicine | Admitting: Family Medicine

## 2017-11-23 DIAGNOSIS — Z1231 Encounter for screening mammogram for malignant neoplasm of breast: Secondary | ICD-10-CM

## 2018-11-21 ENCOUNTER — Other Ambulatory Visit: Payer: Self-pay | Admitting: Family Medicine

## 2018-11-21 DIAGNOSIS — Z1231 Encounter for screening mammogram for malignant neoplasm of breast: Secondary | ICD-10-CM

## 2019-01-08 ENCOUNTER — Ambulatory Visit
Admission: RE | Admit: 2019-01-08 | Discharge: 2019-01-08 | Disposition: A | Discharge status: Home / Self Care | Payer: Medicare Other | Source: Ambulatory Visit | Attending: Family Medicine | Admitting: Family Medicine

## 2019-01-08 ENCOUNTER — Other Ambulatory Visit: Payer: Self-pay

## 2019-01-08 DIAGNOSIS — Z1231 Encounter for screening mammogram for malignant neoplasm of breast: Secondary | ICD-10-CM

## 2019-01-08 IMAGING — MG MM DIGITAL SCREENING BILAT W/ TOMO W/ CAD
8 series · 8 of 24 positions shown · non-contrast
Comparison: Previous exam(s).

CLINICAL DATA: Screening.

EXAM:
DIGITAL SCREENING BILATERAL MAMMOGRAM WITH TOMO AND CAD

[R MLO synth-2D]
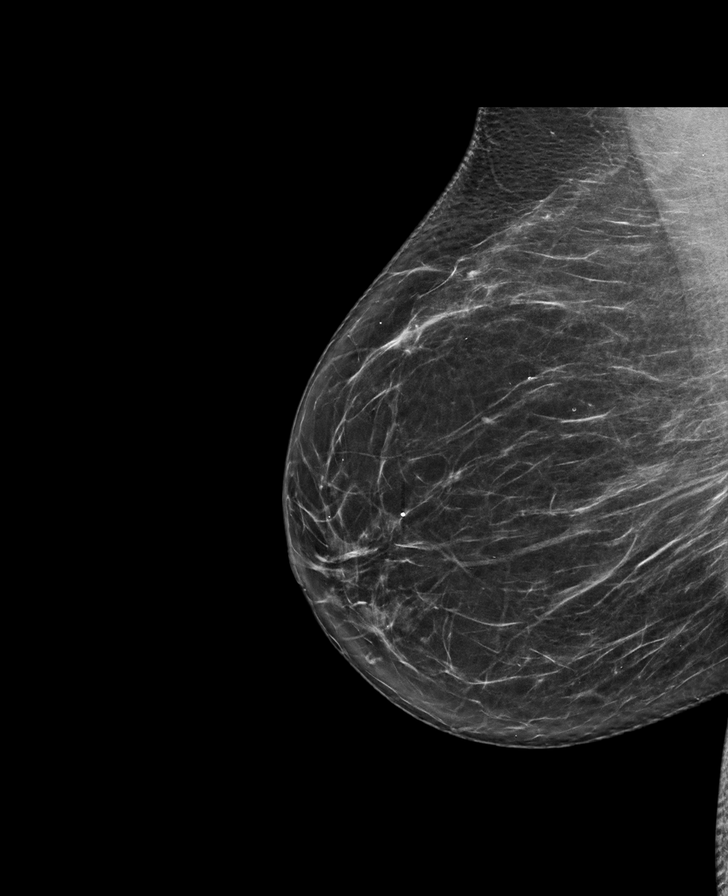

[R CC synth-2D]
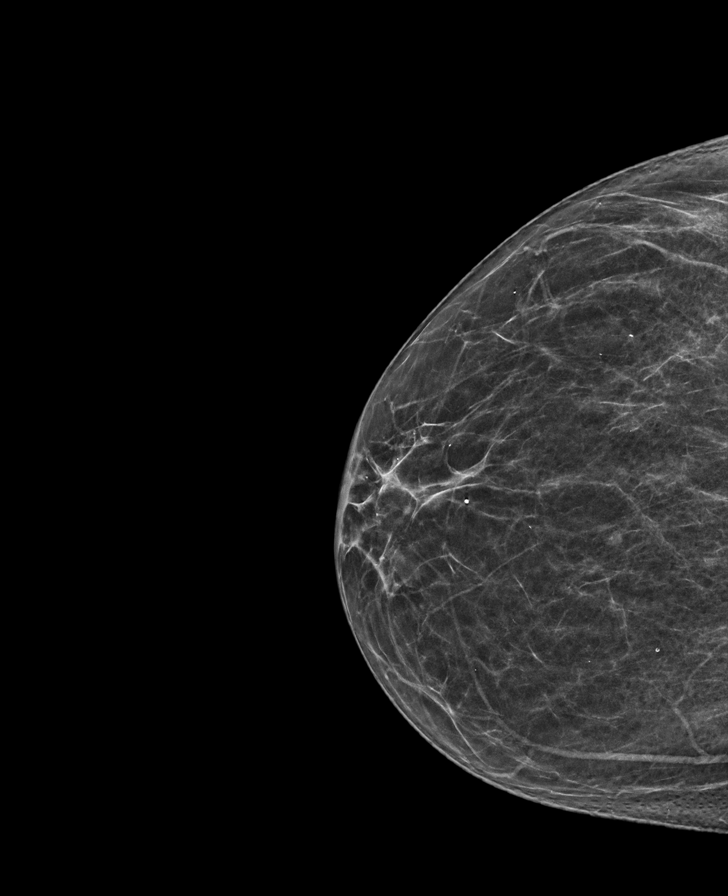

[L CC synth-2D]
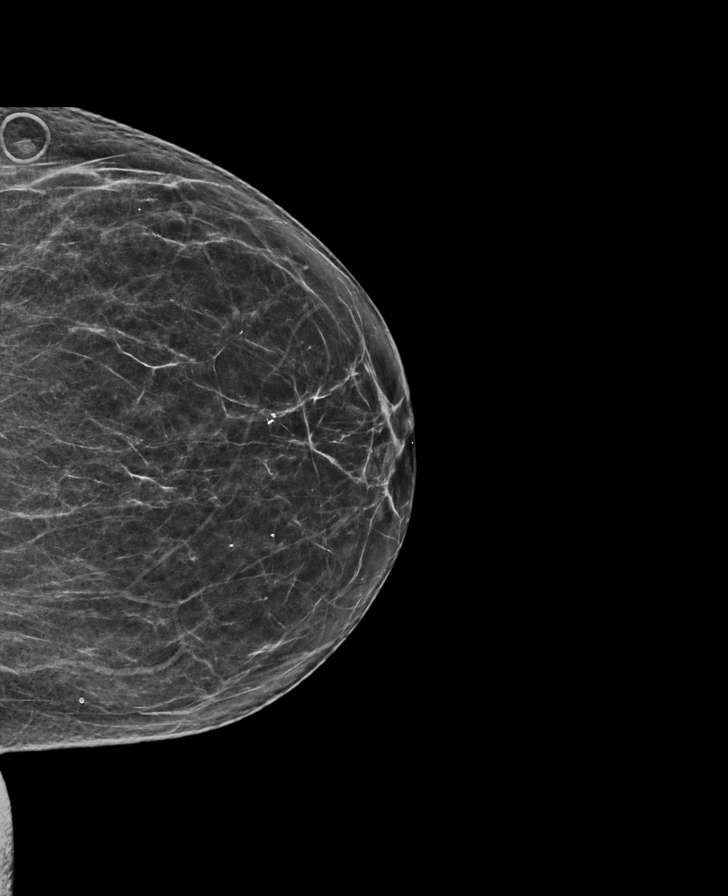

[L MLO synth-2D]
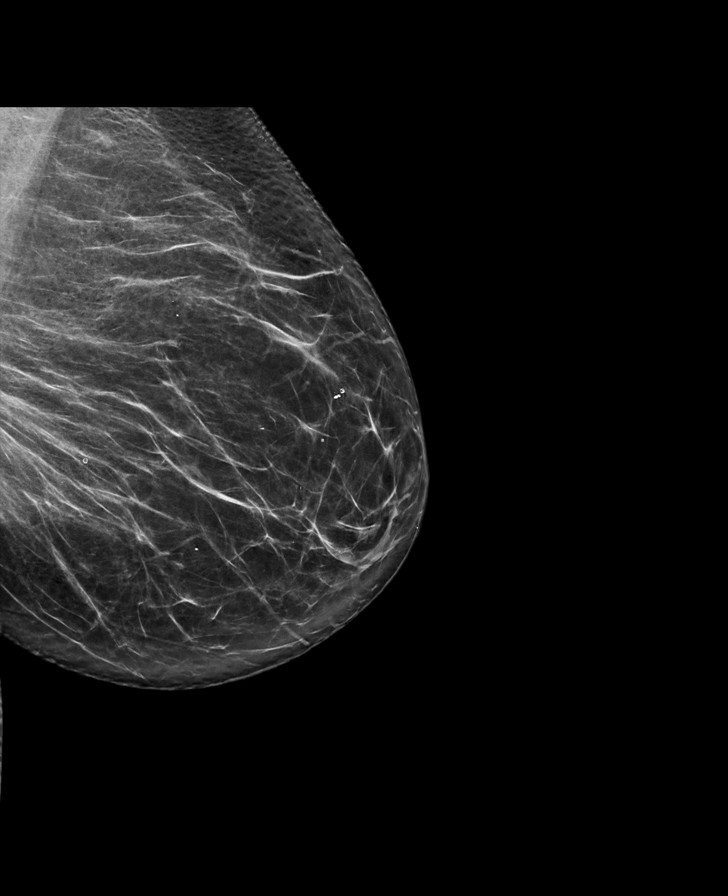

[R CC tomo · tomo slice 30/59.0]
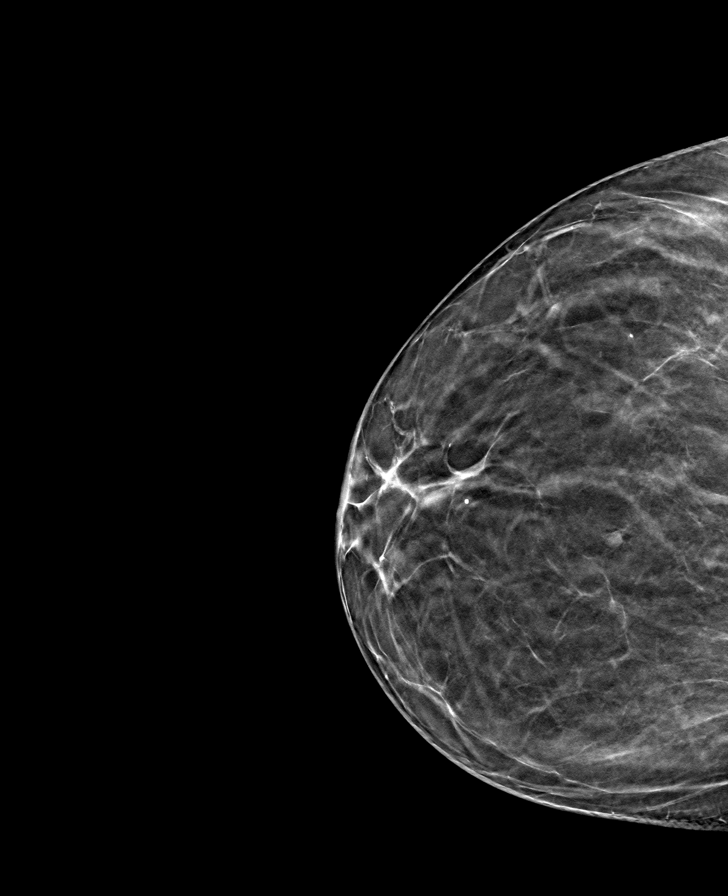

[R MLO tomo · tomo slice 37/74.0]
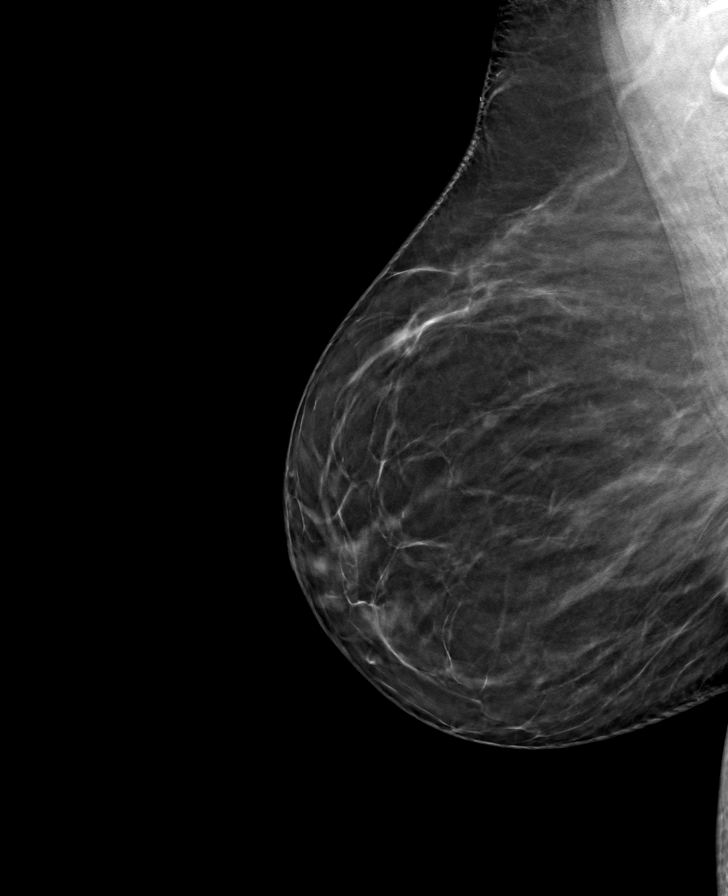

[L MLO tomo · tomo slice 39/78.0]
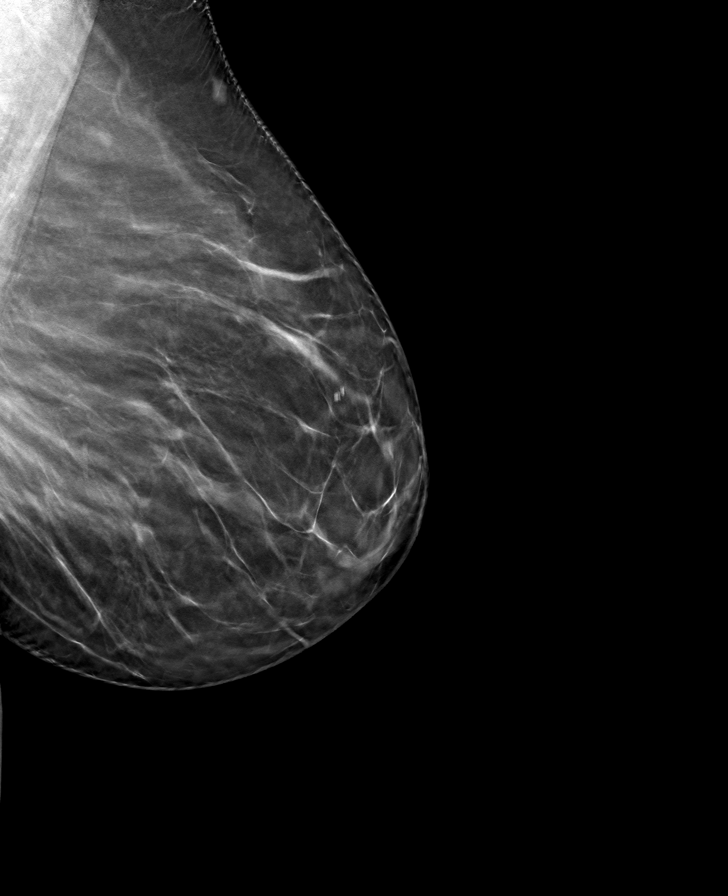

[L CC tomo · tomo slice 32/63.0]
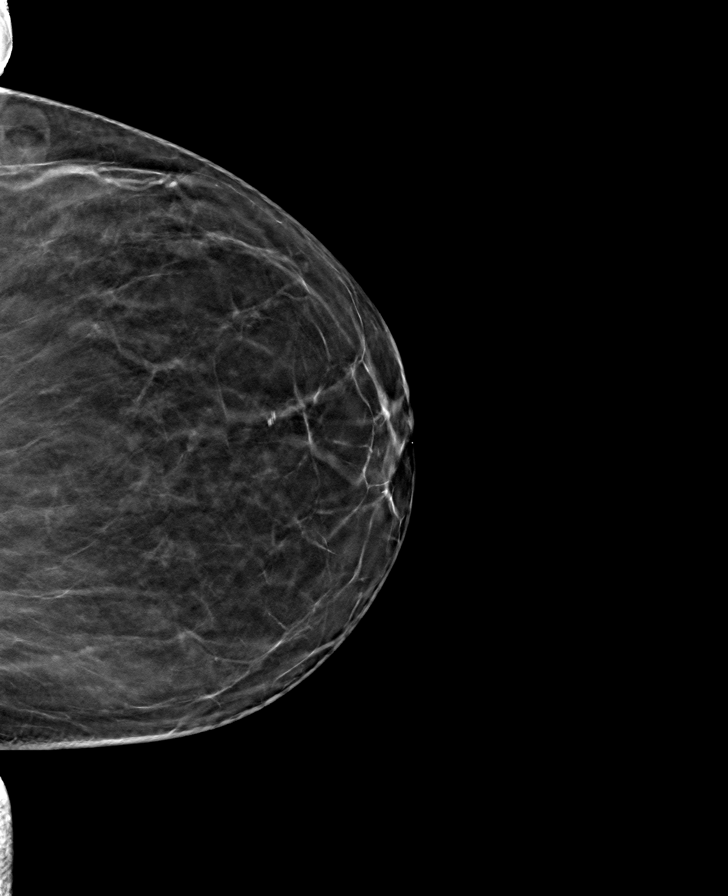

[8 of 24 positions shown; findings below may reference images not displayed]

ACR Breast Density Category b: There are scattered areas of
fibroglandular density.
FINDINGS: There are no findings suspicious for malignancy. Images were
processed with CAD.
IMPRESSION: No mammographic evidence of malignancy. A result letter of this
screening mammogram will be mailed directly to the patient.

RECOMMENDATION:
Screening mammogram in one year. (Code:[TQ])

BI-RADS CATEGORY  1: Negative.

## 2019-06-07 ENCOUNTER — Ambulatory Visit: Payer: Medicare Other

## 2019-06-12 ENCOUNTER — Ambulatory Visit: Payer: Medicare Other

## 2019-06-26 NOTE — Progress Notes (Signed)
Cardiology Office Note   Date:  06/30/2019   ID:  Devanshi, Cruse 1945/06/20, MRN [id number]  PCP:  Shirline Frees, MD  Cardiologist:   Mayrin Schmuck Martinique, MD   Chief Complaint  Patient presents with  . Coronary Artery Disease      History of Present Illness: Gina Villarreal is a 74 y.o. female  With CAD s/p PCI of the RCA in 2003, hypertension, hyperlipidemia and DM type 2.  She was seen in November 2017. In 2016 she underwent cardiac cath on September 26. This demonstrated severe 2 vessel CAD with patent stent in RCA. There was a 90% stenosis in the mid LAD followed by a long segment of 45% disease in the mid to distal vessel. There was also a 90% stenosis in the first OM. The mid LAD and first OM were stented successfully with DES. She is intolerant of beta blocker due to SOB and fatigue. She also reports intolerance to Lipitor and Crestor in the past due to myalgias.   On follow up today she is doing very well. No chest pain or SOB. Still exercising regularly walking on her treadmill or outside 3x/wk. No edema or palpitations.   Past Medical History:  Diagnosis Date  . Anginal pain ([place])   . Arthritis    "fingers" (02/01/2015)  . 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 Combined hyperlipidemia   . Coronary artery disease    a. 2003- BMS to RCA b. 01/2015- DES to mid LAD c. DES to mid-OM1  . GERD (gastroesophageal reflux disease)   . Hypertension   . Hypothyroidism   . Melanoma ([place])    "back; left lateral knee"  . OSA on CPAP   . Rectal cancer ([place]) 2005  . Squamous cell carcinoma of right foot   . Type II diabetes mellitus ([place])     Past Surgical History:  Procedure Laterality Date  . APPENDECTOMY  1960's  . BREAST CYST ASPIRATION Right 1980's  . BREAST EXCISIONAL BIOPSY Right pt unsure   benign  . CARDIAC CATHETERIZATION N/A 02/01/2015   Procedure: Left Heart Cath and Coronary Angiography;  Surgeon: Jadynn Epping M Martinique, MD;  Location: [place] [facility];  Service:  Cardiovascular;  Laterality: N/A;  . CARDIAC CATHETERIZATION  02/01/2015   Procedure: Coronary Stent Intervention;  Surgeon: Su Duma M Martinique, MD;  Location: [place] [facility];  Service: Cardiovascular;;  . COLECTOMY  2005   "rectal"  . CORONARY ANGIOPLASTY WITH STENT PLACEMENT  2003; 2016   a. BMS to RCA in 2003. b. DES to mid LAD and DES to mid OM1 on 02/01/2015  . LAPAROSCOPIC CHOLECYSTECTOMY  1970's  . MELANOMA EXCISION     "back; left lateral knee"  . RIGHT OOPHORECTOMY Right 1960's  . SQUAMOUS CELL CARCINOMA EXCISION Right    "foot"  . TONSILLECTOMY  1950's     Current Outpatient Medications  Medication Sig Dispense Refill  . aspirin 81 MG chewable tablet Chew 1 tablet (81 mg total) by mouth daily.    Marland Kitchen glimepiride (AMARYL) 4 MG tablet Take 4 mg by mouth 2 (two) times daily.    Marland Kitchen GLUCOSAMINE-CHONDROITIN PO Take 1 tablet by mouth daily. 1500-1200 mg    . levothyroxine (SYNTHROID) 125 MCG tablet Take 125 mcg by mouth daily.    Marland Kitchen levothyroxine (SYNTHROID, LEVOTHROID) 175 MCG tablet Take 175 mcg by mouth daily.  0  . lisinopril-hydrochlorothiazide (PRINZIDE,ZESTORETIC) 10-12.5 MG tablet Take 1 tablet by mouth daily.    Marland Kitchen  metFORMIN (GLUCOPHAGE) 1000 MG tablet Take 1,000 mg by mouth 2 (two) times daily with a meal.    . nitroGLYCERIN (NITROSTAT) 0.4 MG SL tablet Place 1 tablet (0.4 mg total) under the tongue every 5 (five) minutes as needed for chest pain. 25 tablet 5  . pravastatin (PRAVACHOL) 40 MG tablet Take 40 mg by mouth 2 (two) times daily.    . TRULICITY 1.5 0000000 SOPN     . Vitamin D, Ergocalciferol, (DRISDOL) 1.25 MG (50000 UNIT) CAPS capsule Take 50,000 Units by mouth once a week.    . ezetimibe (ZETIA) 10 MG tablet Take 1 tablet (10 mg total) by mouth daily. 30 tablet 6  . omega-3 acid ethyl esters (LOVAZA) 1 g capsule Take 2 capsules (2 g total) by mouth 2 (two) times daily. 120 capsule 6   No current facility-administered medications for this visit.    Allergies:    Metoprolol and Morphine and related    Social History:  The patient  reports that she quit smoking about 18 years ago. Her smoking use included cigarettes. She has a 40.00 pack-year smoking history. She has never used smokeless tobacco. She reports current alcohol use. She reports that she does not use drugs.   Family History:  The patient's family history includes Heart attack in her father.    ROS:  Please see the history of present illness.   Otherwise, review of systems are positive for none.   All other systems are reviewed and negative.    PHYSICAL EXAM: VS:  BP 124/67   Pulse 92   Temp (!) 96.7 F (35.9 C)   Ht 5\' 5"  (1.651 m)   Wt 165 lb (74.8 kg)   SpO2 98%   BMI 27.46 kg/m  , BMI Body mass index is 27.46 kg/m. GENERAL:  Well appearing WF in NAD HEENT:  Pupils equal round and reactive, fundi not visualized, oral mucosa unremarkable NECK:  No jugular venous distention, waveform within normal limits, carotid upstroke brisk and symmetric, no bruits, no thyromegaly LYMPHATICS:  No cervical adenopathy LUNGS:  Clear to auscultation bilaterally HEART:  RRR.  PMI not displaced or sustained,S1 and S2 within normal limits, no S3, no S4, no clicks, no rubs, no murmurs ABD:  Flat, positive bowel sounds normal in frequency in pitch, no bruits, no rebound, no guarding, no midline pulsatile mass, no hepatomegaly, no splenomegaly EXT:  2 plus pulses throughout, no edema, no cyanosis no clubbing SKIN:  No rashes no nodules NEURO:  Cranial nerves II through XII grossly intact, motor grossly intact throughout PSYCH:  Cognitively intact, oriented to person place and time    EKG:  EKG is  ordered today. It shows NSR with normal Ecg. Rate 92. I have personally reviewed and interpreted this study.  Recent Labs: No results found for requested labs within last 8760 hours.  Lipid Panel No results found for: CHOL, TRIG, HDL, CHOLHDL, VLDL, LDLCALC, LDLDIRECT    Wt Readings from Last 3  Encounters:  06/30/19 165 lb (74.8 kg)  03/27/16 176 lb 3.2 oz (79.9 kg)  08/23/15 176 lb 8 oz (80.1 kg)     Other studies Reviewed: Additional studies/ records that were reviewed today include: .Labs dated 10/22/15 Review of the above records demonstrates:  Cholesterol 205, triglycerides 345, HDL 46, LDL 89. A1c 8.9%. Labs 01/28/19: cholesterol 184, triglycerides 232, HDL 47, LDL 91. A1c 7.5%. otherwise CMET, CBC, TSH normal.   ASSESSMENT AND PLAN:  1. CAD s/p remote stenting of the  RCA in 2003. S/p DES stenting of the LAD and first OM in September 2016. She is asymptomatic. Continue ASA 81 mg daily.  2. HTN well controlled. 3. Hyperlipidemia  on pravastatin. Triglycerides elevated. LDL is not at goal of < 70 and ideally less than 55. Recommend addition of Lovaza 2 grams bid for triglycerides and add Zetia 10 mg daily for cholesterol. She is scheduled to see Dr Kenton Kingfisher at the end of March with repeat lab work. If still not at goal would consider a PCSK 9 inhibitor.  4. DM on oral therapy. Improved control with Trulicity.   Current medicines are reviewed at length with the patient today.  The patient does not have concerns regarding medicines.  The following changes have been made:  Add Zetia 10 mg daily. Add Lovaza 2 gms bid.   Labs/ tests ordered today include:   Orders Placed This Encounter  Procedures  . EKG 12-Lead    Disposition:   FU with me in one year  Signed, Kairi Harshbarger Martinique, MD  06/30/2019 4:27 PM    [place]

## 2019-06-30 ENCOUNTER — Ambulatory Visit: Payer: Medicare Other | Admitting: Cardiology

## 2019-06-30 ENCOUNTER — Other Ambulatory Visit: Payer: Self-pay

## 2019-06-30 ENCOUNTER — Encounter: Payer: Self-pay | Admitting: Cardiology

## 2019-06-30 VITALS — BP 124/67 | HR 92 | Temp 96.7°F | Ht 65.0 in | Wt 165.0 lb

## 2019-06-30 DIAGNOSIS — E119 Type 2 diabetes mellitus without complications: Secondary | ICD-10-CM | POA: Diagnosis not present

## 2019-06-30 DIAGNOSIS — I2510 Atherosclerotic heart disease of native coronary artery without angina pectoris: Secondary | ICD-10-CM | POA: Diagnosis not present

## 2019-06-30 DIAGNOSIS — I10 Essential (primary) hypertension: Secondary | ICD-10-CM

## 2019-06-30 DIAGNOSIS — E782 Mixed hyperlipidemia: Secondary | ICD-10-CM | POA: Diagnosis not present

## 2019-06-30 MED ORDER — EZETIMIBE 10 MG PO TABS: 10.0000 mg | ORAL_TABLET | Freq: Every day | ORAL | 6 refills | Status: DC

## 2019-06-30 MED ORDER — OMEGA-3-ACID ETHYL ESTERS 1 G PO CAPS: 2.0000 g | ORAL_CAPSULE | Freq: Two times a day (BID) | ORAL | 6 refills | Status: DC

## 2019-06-30 NOTE — Patient Instructions (Signed)
Add Zetia 10 mg daily for cholesterol  Add Lovaza 2 grams twice a day for triglycerides.  Follow up lab work with Dr Kenton Kingfisher.  I will see you in one year.

## 2020-01-16 ENCOUNTER — Other Ambulatory Visit: Payer: Self-pay | Admitting: Cardiology

## 2020-05-11 DIAGNOSIS — Z20822 Contact with and (suspected) exposure to covid-19: Secondary | ICD-10-CM | POA: Diagnosis not present

## 2020-06-28 DIAGNOSIS — Z933 Colostomy status: Secondary | ICD-10-CM | POA: Diagnosis not present

## 2020-07-07 DIAGNOSIS — E782 Mixed hyperlipidemia: Secondary | ICD-10-CM | POA: Diagnosis not present

## 2020-07-07 DIAGNOSIS — E039 Hypothyroidism, unspecified: Secondary | ICD-10-CM | POA: Diagnosis not present

## 2020-07-07 DIAGNOSIS — I10 Essential (primary) hypertension: Secondary | ICD-10-CM | POA: Diagnosis not present

## 2020-07-07 DIAGNOSIS — E1165 Type 2 diabetes mellitus with hyperglycemia: Secondary | ICD-10-CM | POA: Diagnosis not present

## 2020-09-03 DIAGNOSIS — M25512 Pain in left shoulder: Secondary | ICD-10-CM | POA: Diagnosis not present

## 2020-10-15 DIAGNOSIS — Z933 Colostomy status: Secondary | ICD-10-CM | POA: Diagnosis not present

## 2020-10-29 DIAGNOSIS — M25512 Pain in left shoulder: Secondary | ICD-10-CM | POA: Diagnosis not present

## 2020-11-23 DIAGNOSIS — M25512 Pain in left shoulder: Secondary | ICD-10-CM | POA: Diagnosis not present

## 2021-01-03 ENCOUNTER — Other Ambulatory Visit: Payer: Self-pay | Admitting: Family Medicine

## 2021-01-03 DIAGNOSIS — Z1231 Encounter for screening mammogram for malignant neoplasm of breast: Secondary | ICD-10-CM

## 2021-01-12 DIAGNOSIS — Z85038 Personal history of other malignant neoplasm of large intestine: Secondary | ICD-10-CM | POA: Diagnosis not present

## 2021-01-12 DIAGNOSIS — E039 Hypothyroidism, unspecified: Secondary | ICD-10-CM | POA: Diagnosis not present

## 2021-01-12 DIAGNOSIS — Z23 Encounter for immunization: Secondary | ICD-10-CM | POA: Diagnosis not present

## 2021-01-12 DIAGNOSIS — Z0000 Encounter for general adult medical examination without abnormal findings: Secondary | ICD-10-CM | POA: Diagnosis not present

## 2021-01-12 DIAGNOSIS — I10 Essential (primary) hypertension: Secondary | ICD-10-CM | POA: Diagnosis not present

## 2021-01-12 DIAGNOSIS — E782 Mixed hyperlipidemia: Secondary | ICD-10-CM | POA: Diagnosis not present

## 2021-01-12 DIAGNOSIS — I2510 Atherosclerotic heart disease of native coronary artery without angina pectoris: Secondary | ICD-10-CM | POA: Diagnosis not present

## 2021-01-12 DIAGNOSIS — E1165 Type 2 diabetes mellitus with hyperglycemia: Secondary | ICD-10-CM | POA: Diagnosis not present

## 2021-02-10 ENCOUNTER — Other Ambulatory Visit: Payer: Self-pay

## 2021-02-10 ENCOUNTER — Ambulatory Visit
Admission: RE | Admit: 2021-02-10 | Discharge: 2021-02-10 | Disposition: A | Discharge status: Home / Self Care | Payer: Medicare Other | Source: Ambulatory Visit | Attending: Family Medicine | Admitting: Family Medicine

## 2021-02-10 DIAGNOSIS — Z1231 Encounter for screening mammogram for malignant neoplasm of breast: Secondary | ICD-10-CM | POA: Diagnosis not present

## 2021-02-10 IMAGING — MG MM DIGITAL SCREENING BILAT W/ TOMO AND CAD
8 series · 8 of 24 positions shown · non-contrast
Comparison: Previous exam(s).

CLINICAL DATA: Screening.

EXAM:
DIGITAL SCREENING BILATERAL MAMMOGRAM WITH TOMOSYNTHESIS AND CAD
TECHNIQUE: Bilateral screening digital craniocaudal and mediolateral oblique
mammograms were obtained. Bilateral screening digital breast
tomosynthesis was performed. The images were evaluated with
computer-aided detection.

[R MLO synth-2D]
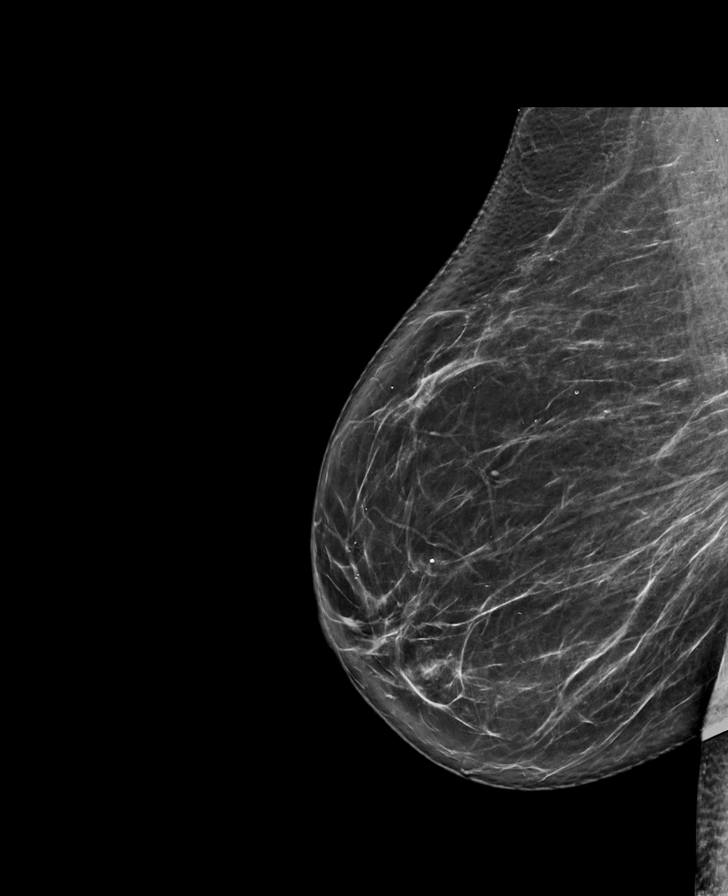

[L MLO synth-2D]
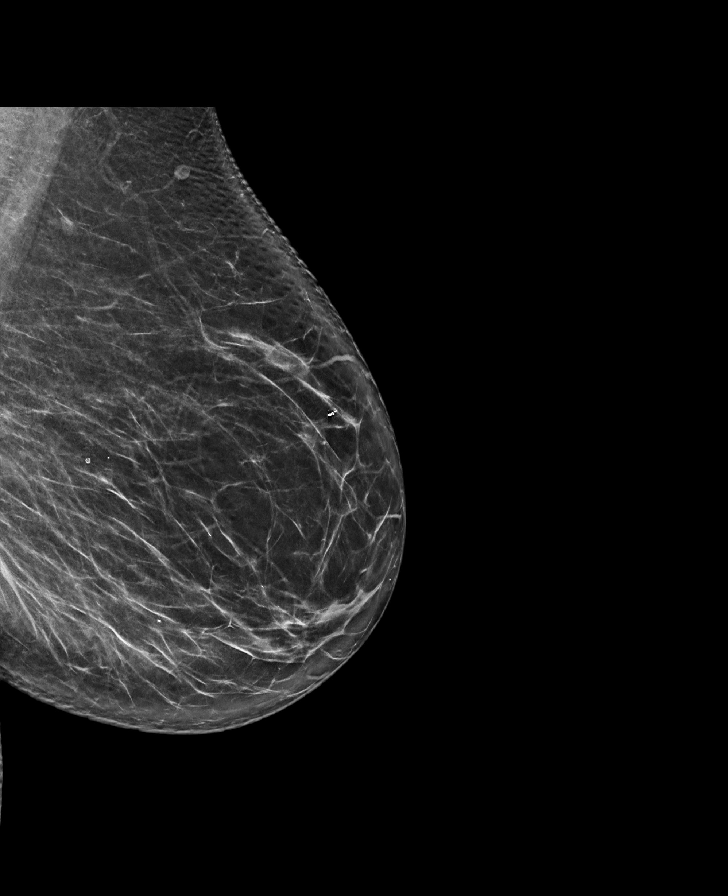

[R CC synth-2D]
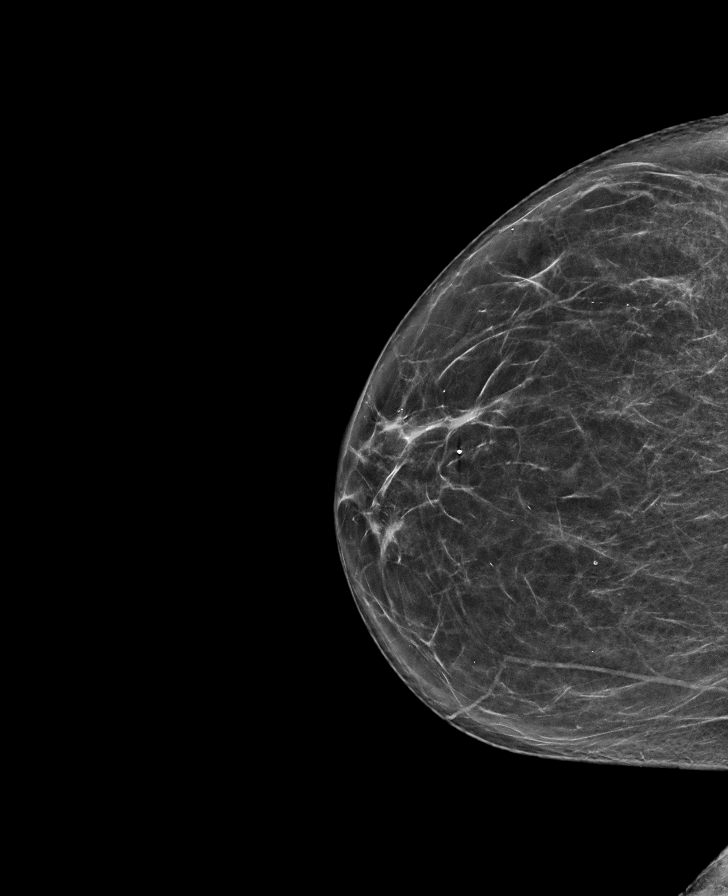

[L CC synth-2D]
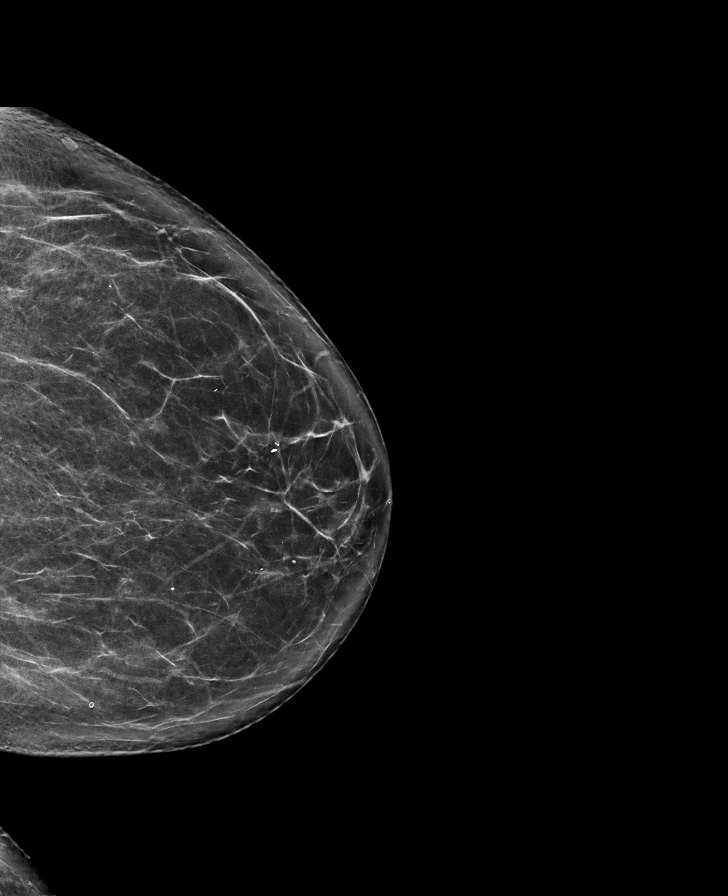

[L CC tomo · tomo slice 39/76.0]
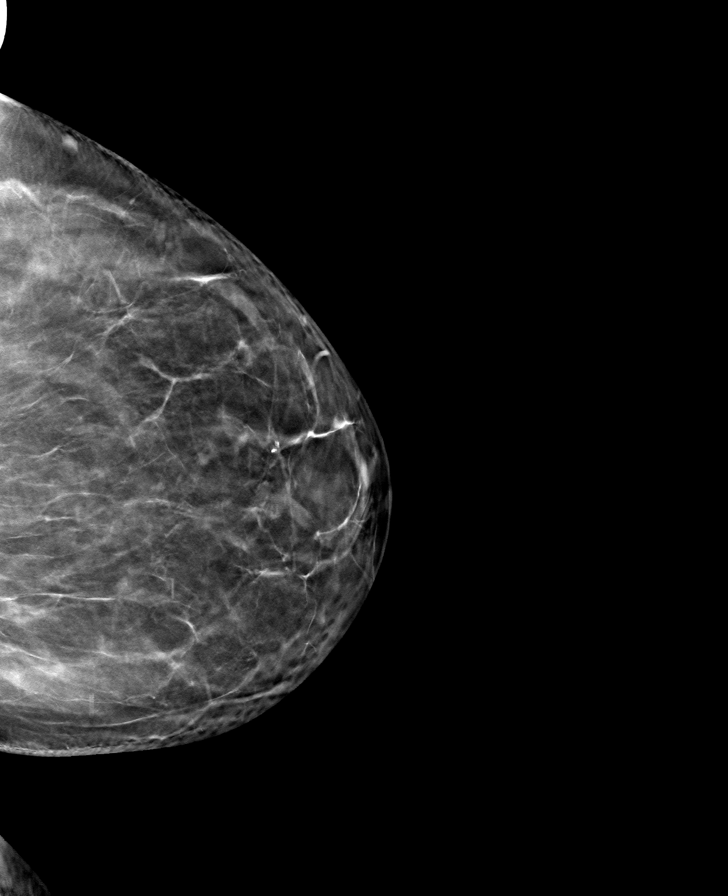

[R CC tomo · tomo slice 36/71.0]
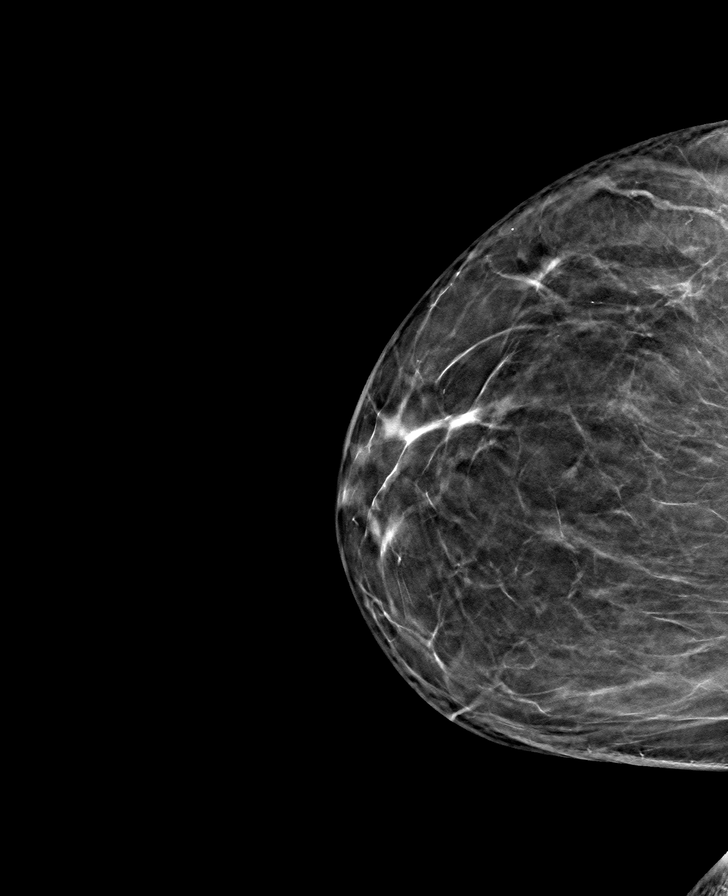

[L MLO tomo · tomo slice 41/80.0]
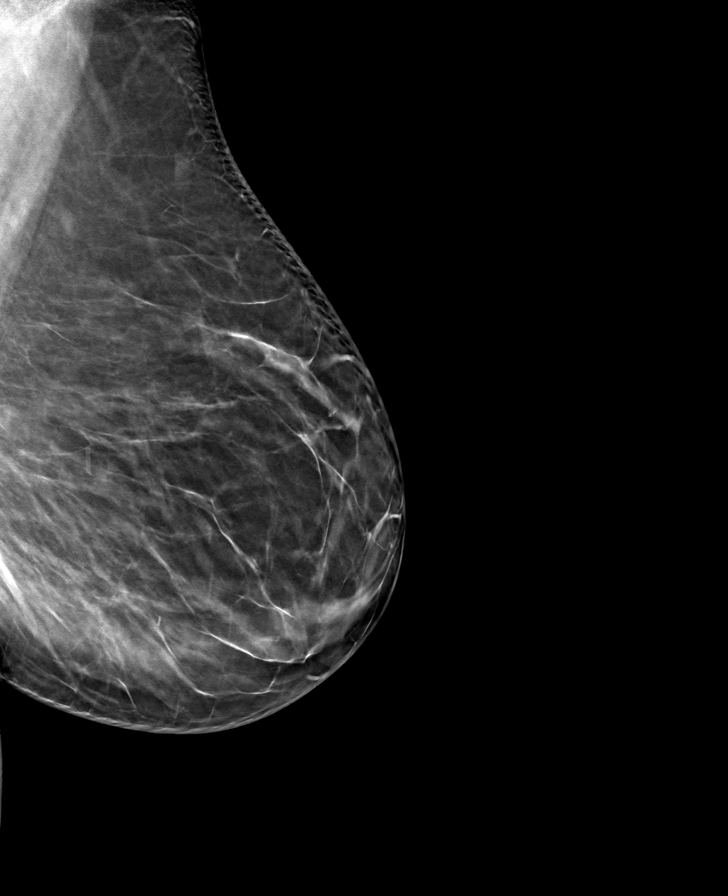

[R MLO tomo · tomo slice 41/81.0]
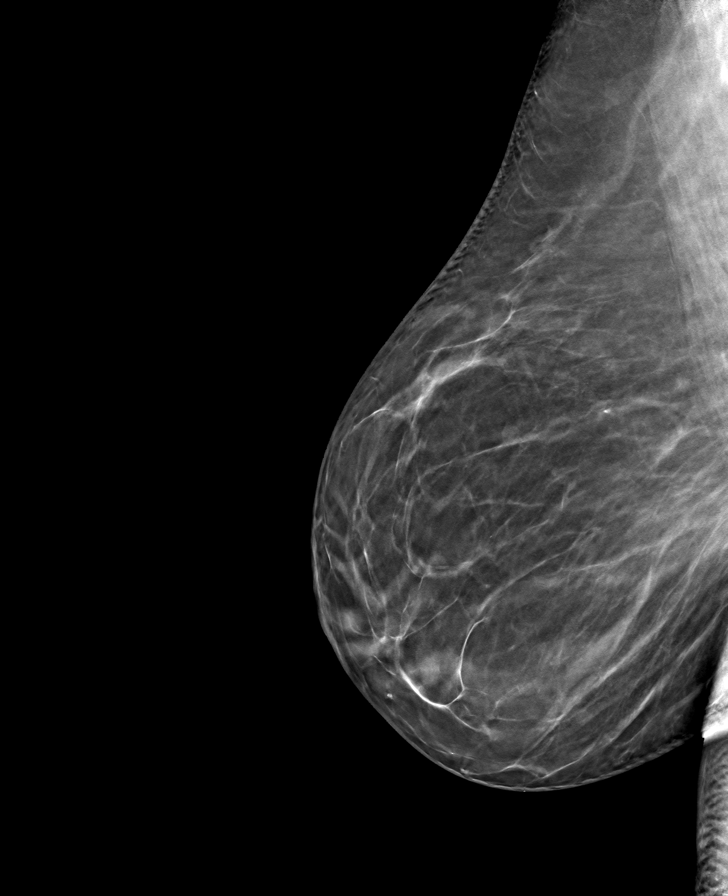

[8 of 24 positions shown; findings below may reference images not displayed]

ACR Breast Density Category b: There are scattered areas of
fibroglandular density.
FINDINGS: There are no findings suspicious for malignancy.
IMPRESSION: No mammographic evidence of malignancy. A result letter of this
screening mammogram will be mailed directly to the patient.

RECOMMENDATION:
Screening mammogram in one year. (Code:[BY])

BI-RADS CATEGORY  1: Negative.

## 2021-04-28 DIAGNOSIS — Z933 Colostomy status: Secondary | ICD-10-CM | POA: Diagnosis not present

## 2021-06-27 DIAGNOSIS — J0110 Acute frontal sinusitis, unspecified: Secondary | ICD-10-CM | POA: Diagnosis not present

## 2021-06-30 ENCOUNTER — Emergency Department (HOSPITAL_BASED_OUTPATIENT_CLINIC_OR_DEPARTMENT_OTHER): Payer: Medicare Other

## 2021-06-30 ENCOUNTER — Other Ambulatory Visit: Payer: Self-pay

## 2021-06-30 ENCOUNTER — Encounter (HOSPITAL_BASED_OUTPATIENT_CLINIC_OR_DEPARTMENT_OTHER): Payer: Self-pay | Admitting: Emergency Medicine

## 2021-06-30 ENCOUNTER — Other Ambulatory Visit (HOSPITAL_BASED_OUTPATIENT_CLINIC_OR_DEPARTMENT_OTHER): Payer: Self-pay

## 2021-06-30 ENCOUNTER — Emergency Department (HOSPITAL_BASED_OUTPATIENT_CLINIC_OR_DEPARTMENT_OTHER)
Admission: EM | Admit: 2021-06-30 | Discharge: 2021-06-30 | Disposition: A | Discharge status: Home / Self Care | Payer: Medicare Other | Attending: Emergency Medicine | Admitting: Emergency Medicine

## 2021-06-30 DIAGNOSIS — E860 Dehydration: Secondary | ICD-10-CM

## 2021-06-30 DIAGNOSIS — Z20822 Contact with and (suspected) exposure to covid-19: Secondary | ICD-10-CM | POA: Diagnosis not present

## 2021-06-30 DIAGNOSIS — R1110 Vomiting, unspecified: Secondary | ICD-10-CM | POA: Diagnosis not present

## 2021-06-30 DIAGNOSIS — Z7982 Long term (current) use of aspirin: Secondary | ICD-10-CM | POA: Insufficient documentation

## 2021-06-30 DIAGNOSIS — R9082 White matter disease, unspecified: Secondary | ICD-10-CM | POA: Diagnosis not present

## 2021-06-30 DIAGNOSIS — E876 Hypokalemia: Secondary | ICD-10-CM

## 2021-06-30 DIAGNOSIS — R519 Headache, unspecified: Secondary | ICD-10-CM | POA: Insufficient documentation

## 2021-06-30 DIAGNOSIS — J3489 Other specified disorders of nose and nasal sinuses: Secondary | ICD-10-CM | POA: Insufficient documentation

## 2021-06-30 DIAGNOSIS — J019 Acute sinusitis, unspecified: Secondary | ICD-10-CM | POA: Diagnosis not present

## 2021-06-30 LAB — CBC
HCT: 40 % (ref 36.0–46.0)
Hemoglobin: 13.6 g/dL (ref 12.0–15.0)
MCH: 28.3 pg (ref 26.0–34.0)
MCHC: 34 g/dL (ref 30.0–36.0)
MCV: 83.3 fL (ref 80.0–100.0)
Platelets: 390 10*3/uL (ref 150–400)
RBC: 4.8 MIL/uL (ref 3.87–5.11)
RDW: 13.2 % (ref 11.5–15.5)
WBC: 12.5 10*3/uL — ABNORMAL HIGH (ref 4.0–10.5)
nRBC: 0 % (ref 0.0–0.2)

## 2021-06-30 LAB — COMPREHENSIVE METABOLIC PANEL
ALT: 19 U/L (ref 0–44)
AST: 16 U/L (ref 15–41)
Albumin: 4.7 g/dL (ref 3.5–5.0)
Alkaline Phosphatase: 61 U/L (ref 38–126)
Anion gap: 20 — ABNORMAL HIGH (ref 5–15)
BUN: 17 mg/dL (ref 8–23)
CO2: 21 mmol/L — ABNORMAL LOW (ref 22–32)
Calcium: 9.9 mg/dL (ref 8.9–10.3)
Chloride: 93 mmol/L — ABNORMAL LOW (ref 98–111)
Creatinine, Ser: 0.89 mg/dL (ref 0.44–1.00)
GFR, Estimated: >60 mL/min (ref >60)
Glucose, Bld: 254 mg/dL — ABNORMAL HIGH (ref 70–99)
Potassium: 3.2 mmol/L — ABNORMAL LOW (ref 3.5–5.1)
Sodium: 134 mmol/L — ABNORMAL LOW (ref 135–145)
Total Bilirubin: 1 mg/dL (ref 0.3–1.2)
Total Protein: 7.2 g/dL (ref 6.5–8.1)

## 2021-06-30 LAB — URINALYSIS, ROUTINE W REFLEX MICROSCOPIC
Bilirubin Urine: NEGATIVE
Glucose, UA: >1000 mg/dL — AB
Hgb urine dipstick: NEGATIVE
Ketones, ur: >80 mg/dL — AB
Leukocytes,Ua: NEGATIVE
Nitrite: NEGATIVE
Protein, ur: 30 mg/dL — AB
Specific Gravity, Urine: 1.016 (ref 1.005–1.030)
pH: 5.5 (ref 5.0–8.0)

## 2021-06-30 LAB — RESP PANEL BY RT-PCR (FLU A&B, COVID) ARPGX2
Influenza A by PCR: NEGATIVE
Influenza B by PCR: NEGATIVE
SARS Coronavirus 2 by RT PCR: NEGATIVE

## 2021-06-30 LAB — LIPASE, BLOOD: Lipase: 68 U/L — ABNORMAL HIGH (ref 11–51)

## 2021-06-30 MED ORDER — KETOROLAC TROMETHAMINE 30 MG/ML IJ SOLN
30.0000 mg | Freq: Once | INTRAMUSCULAR | Status: AC
  Administered 2021-06-30: 30 mg via INTRAVENOUS
  Filled 2021-06-30: qty 1

## 2021-06-30 MED ORDER — FENTANYL CITRATE PF 50 MCG/ML IJ SOSY
50.0000 ug | PREFILLED_SYRINGE | Freq: Once | INTRAMUSCULAR | Status: AC
  Administered 2021-06-30: 50 ug via INTRAVENOUS
  Filled 2021-06-30: qty 1

## 2021-06-30 MED ORDER — METOCLOPRAMIDE HCL 5 MG/ML IJ SOLN
5.0000 mg | Freq: Once | INTRAMUSCULAR | Status: AC
  Administered 2021-06-30: 5 mg via INTRAVENOUS
  Filled 2021-06-30: qty 2

## 2021-06-30 MED ORDER — LACTATED RINGERS IV SOLN: INTRAVENOUS | Status: DC

## 2021-06-30 MED ORDER — POTASSIUM CHLORIDE CRYS ER 20 MEQ PO TBCR
40.0000 meq | EXTENDED_RELEASE_TABLET | Freq: Once | ORAL | Status: AC
Start: 2021-06-30 — End: 2021-06-30
  Administered 2021-06-30: 40 meq via ORAL
  Filled 2021-06-30: qty 2

## 2021-06-30 MED ORDER — OXYCODONE-ACETAMINOPHEN 5-325 MG PO TABS
1.0000 | ORAL_TABLET | Freq: Four times a day (QID) | ORAL | 0 refills | Status: DC | PRN
  Filled 2021-06-30: qty 15, 2d supply, fill #0

## 2021-06-30 MED ORDER — LACTATED RINGERS IV BOLUS
2000.0000 mL | Freq: Once | INTRAVENOUS | Status: AC
  Administered 2021-06-30: 2000 mL via INTRAVENOUS

## 2021-06-30 NOTE — ED Triage Notes (Addendum)
H?a x 1 week  was  seen by dr on Monday dx with sinus infection and placed on z pack now nauseated, pt is diabetic has not eaten in a day and hasnt had her meds for diabetes

## 2021-06-30 NOTE — ED Provider Notes (Signed)
[facility] Provider Note   CSN: [id number] Arrival date & time: 06/30/21  0754     History  Chief Complaint  Patient presents with   Nausea    Gina Villarreal is a 76 y.o. female.  76 year old female presents with several days of frontal sinus pressure.  Seen her doctor and started on azithromycin.  Did get better initially but headache became worse.  Headache is described as pressure and worse with sitting forward better with lying back.  No associated fever or chills.  Has had some emesis without chest or abdominal discomfort.  Patient feels the pain as well as made her have vomiting.  Denies any visual changes.  No focal neurological deficits      Home Medications Prior to Admission medications   Medication Sig Start Date End Date Taking? Authorizing Provider  aspirin 81 MG chewable tablet Chew 1 tablet (81 mg total) by mouth daily. 02/02/15   Strader, Fransisco Hertz, PA-C  ezetimibe (ZETIA) 10 MG tablet TAKE ONE TABLET BY MOUTH DAILY 01/16/20   Martinique, Peter M, MD  glimepiride (AMARYL) 4 MG tablet Take 4 mg by mouth 2 (two) times daily.    [provider]  GLUCOSAMINE-CHONDROITIN PO Take 1 tablet by mouth daily. 1500-1200 mg    [provider]  levothyroxine (SYNTHROID) 125 MCG tablet Take 125 mcg by mouth daily. 06/08/19   [provider]  levothyroxine (SYNTHROID, LEVOTHROID) 175 MCG tablet Take 175 mcg by mouth daily. 01/05/15   [provider]  lisinopril-hydrochlorothiazide (PRINZIDE,ZESTORETIC) 10-12.5 MG tablet Take 1 tablet by mouth daily.    [provider]  metFORMIN (GLUCOPHAGE) 1000 MG tablet Take 1,000 mg by mouth 2 (two) times daily with a meal.    [provider]  nitroGLYCERIN (NITROSTAT) 0.4 MG SL tablet Place 1 tablet (0.4 mg total) under the tongue every 5 (five) minutes as needed for chest pain. 01/29/15   Skeet Latch, MD  omega-3 acid ethyl esters (LOVAZA) 1 g capsule Take 2  capsules (2 g total) by mouth 2 (two) times daily. 06/30/19   Martin[place], Peter M, MD  pravastatin (PRAVACHOL) 40 MG tablet Take 40 mg by mouth 2 (two) times daily.    [provider]  TRULICITY 1.5 NW/2.9FA SOPN  06/08/19   [provider]  Vitamin D, Ergocalciferol, (DRISDOL) 1.25 MG (50000 UNIT) CAPS capsule Take 50,000 Units by mouth once a week. 04/24/19   [provider]      Allergies    Metoprolol and Morphine and related    Review of Systems   Review of Systems  All other systems reviewed and are negative.  Physical Exam Updated Vital Signs BP (!) 171/87 (BP Location: Left Arm)    Pulse 96    Temp 98.5 F (36.9 C) (Oral)    Resp (!) 22    Ht 1.651 m (5\' 5" )    Wt 69.4 kg    SpO2 100%    BMI 25.46 kg/m  Physical Exam Vitals and nursing note reviewed.  Constitutional:      General: She is not in acute distress.    Appearance: Normal appearance. She is well-developed. She is not toxic-appearing.  HENT:     Head: Normocephalic and atraumatic.  Eyes:     General: Lids are normal.     Conjunctiva/sclera: Conjunctivae normal.     Pupils: Pupils are equal, round, and reactive to light.  Neck:     Thyroid: No thyroid mass.  Trachea: No tracheal deviation.  Cardiovascular:     Rate and Rhythm: Normal rate and regular rhythm.     Heart sounds: Normal heart sounds. No murmur heard.   No gallop.  Pulmonary:     Effort: Pulmonary effort is normal. No respiratory distress.     Breath sounds: Normal breath sounds. No stridor. No decreased breath sounds, wheezing, rhonchi or rales.  Abdominal:     General: There is no distension.     Palpations: Abdomen is soft.     Tenderness: There is no abdominal tenderness. There is no rebound.  Musculoskeletal:        General: No tenderness. Normal range of motion.     Cervical back: Normal range of motion and neck supple.  Skin:    General: Skin is warm and dry.     Findings: No abrasion or rash.  Neurological:      General: No focal deficit present.     Mental Status: She is alert and oriented to person, place, and time. Mental status is at baseline.     GCS: GCS eye subscore is 4. GCS verbal subscore is 5. GCS motor subscore is 6.     Cranial Nerves: No cranial nerve deficit.     Sensory: No sensory deficit.     Motor: Motor function is intact.  Psychiatric:        Attention and Perception: Attention normal.        Speech: Speech normal.        Behavior: Behavior normal.    ED Results / Procedures / Treatments   Labs (all labs ordered are listed, but only abnormal results are displayed) Labs Reviewed  RESP PANEL BY RT-PCR (FLU A&B, COVID) ARPGX2  LIPASE, BLOOD  COMPREHENSIVE METABOLIC PANEL  CBC  URINALYSIS, ROUTINE W REFLEX MICROSCOPIC    EKG None  Radiology No results found.  Procedures Procedures    Medications Ordered in ED Medications  lactated ringers bolus 2,000 mL (has no administration in time range)  lactated ringers infusion (has no administration in time range)  fentaNYL (SUBLIMAZE) injection 50 mcg (has no administration in time range)  metoCLOPramide (REGLAN) injection 5 mg (has no administration in time range)    ED Course/ Medical Decision Making/ A&P                           Medical Decision Making Amount and/or Complexity of Data Reviewed Labs: ordered. Radiology: ordered.  Risk Prescription drug management.   Patient here complaining of frontal headache that is worse when she lies flat and better when she sits forward.  No focal neurological deficits.  Head CT ordered to evaluate for possible intracranial abnormality and did not show any evidence of bleed per my look at as well as review of radiology interpretation.  Patient's dehydration confirmed with urinalysis which shows ketones.  Patient does have increased anion gap but likely from dehydration which has been treated.  Patient does have mild hypokalemia and she will be given oral potassium  supplementation.  Patient appeared be clinically dehydrated here and given IV saline and feels better.  Headache treated with IV medications x2 and patient does feel much better at this time.  COVID and flu test negative.  Patient has no neck pain or photophobia.  Considered meningitis although less likely.  Patient has antiemetics at home and will continue that.        Final Clinical Impression(s) / ED Diagnoses Final  diagnoses:  None    Rx / DC Orders ED Discharge Orders     None         Lacretia Leigh, MD 06/30/21 1102

## 2021-06-30 NOTE — ED Notes (Signed)
Pt up to use BSC with assist

## 2021-07-04 ENCOUNTER — Observation Stay (HOSPITAL_COMMUNITY): Payer: Medicare Other

## 2021-07-04 ENCOUNTER — Emergency Department (HOSPITAL_COMMUNITY): Payer: Medicare Other

## 2021-07-04 ENCOUNTER — Other Ambulatory Visit: Payer: Self-pay

## 2021-07-04 ENCOUNTER — Inpatient Hospital Stay (HOSPITAL_COMMUNITY)
Admission: EM | Admit: 2021-07-04 | Discharge: 2021-07-08 | DRG: 102 | Disposition: A | Discharge status: Home Health | Payer: Medicare Other | Attending: Internal Medicine | Admitting: Internal Medicine

## 2021-07-04 DIAGNOSIS — Z85048 Personal history of other malignant neoplasm of rectum, rectosigmoid junction, and anus: Secondary | ICD-10-CM | POA: Diagnosis not present

## 2021-07-04 DIAGNOSIS — G9341 Metabolic encephalopathy: Secondary | ICD-10-CM | POA: Diagnosis present

## 2021-07-04 DIAGNOSIS — R29701 NIHSS score 1: Secondary | ICD-10-CM | POA: Diagnosis not present

## 2021-07-04 DIAGNOSIS — C20 Malignant neoplasm of rectum: Secondary | ICD-10-CM | POA: Diagnosis present

## 2021-07-04 DIAGNOSIS — I2510 Atherosclerotic heart disease of native coronary artery without angina pectoris: Secondary | ICD-10-CM | POA: Diagnosis not present

## 2021-07-04 DIAGNOSIS — R748 Abnormal levels of other serum enzymes: Secondary | ICD-10-CM | POA: Diagnosis not present

## 2021-07-04 DIAGNOSIS — R29710 NIHSS score 10: Secondary | ICD-10-CM | POA: Diagnosis not present

## 2021-07-04 DIAGNOSIS — D72829 Elevated white blood cell count, unspecified: Secondary | ICD-10-CM

## 2021-07-04 DIAGNOSIS — G510 Bell's palsy: Secondary | ICD-10-CM | POA: Diagnosis present

## 2021-07-04 DIAGNOSIS — I499 Cardiac arrhythmia, unspecified: Secondary | ICD-10-CM | POA: Diagnosis not present

## 2021-07-04 DIAGNOSIS — Z20822 Contact with and (suspected) exposure to covid-19: Secondary | ICD-10-CM | POA: Diagnosis present

## 2021-07-04 DIAGNOSIS — E876 Hypokalemia: Secondary | ICD-10-CM | POA: Diagnosis not present

## 2021-07-04 DIAGNOSIS — M19041 Primary osteoarthritis, right hand: Secondary | ICD-10-CM | POA: Diagnosis present

## 2021-07-04 DIAGNOSIS — I2583 Coronary atherosclerosis due to lipid rich plaque: Secondary | ICD-10-CM | POA: Diagnosis not present

## 2021-07-04 DIAGNOSIS — Z955 Presence of coronary angioplasty implant and graft: Secondary | ICD-10-CM

## 2021-07-04 DIAGNOSIS — E039 Hypothyroidism, unspecified: Secondary | ICD-10-CM | POA: Diagnosis present

## 2021-07-04 DIAGNOSIS — Z7982 Long term (current) use of aspirin: Secondary | ICD-10-CM

## 2021-07-04 DIAGNOSIS — H547 Unspecified visual loss: Secondary | ICD-10-CM | POA: Diagnosis not present

## 2021-07-04 DIAGNOSIS — G43109 Migraine with aura, not intractable, without status migrainosus: Principal | ICD-10-CM | POA: Diagnosis present

## 2021-07-04 DIAGNOSIS — M19042 Primary osteoarthritis, left hand: Secondary | ICD-10-CM | POA: Diagnosis present

## 2021-07-04 DIAGNOSIS — E1165 Type 2 diabetes mellitus with hyperglycemia: Secondary | ICD-10-CM | POA: Insufficient documentation

## 2021-07-04 DIAGNOSIS — R6889 Other general symptoms and signs: Secondary | ICD-10-CM | POA: Diagnosis not present

## 2021-07-04 DIAGNOSIS — Z7985 Long-term (current) use of injectable non-insulin antidiabetic drugs: Secondary | ICD-10-CM

## 2021-07-04 DIAGNOSIS — I160 Hypertensive urgency: Secondary | ICD-10-CM | POA: Diagnosis present

## 2021-07-04 DIAGNOSIS — I639 Cerebral infarction, unspecified: Secondary | ICD-10-CM

## 2021-07-04 DIAGNOSIS — R519 Headache, unspecified: Secondary | ICD-10-CM

## 2021-07-04 DIAGNOSIS — D75839 Thrombocytosis, unspecified: Secondary | ICD-10-CM | POA: Diagnosis not present

## 2021-07-04 DIAGNOSIS — E782 Mixed hyperlipidemia: Secondary | ICD-10-CM | POA: Diagnosis not present

## 2021-07-04 DIAGNOSIS — R4781 Slurred speech: Secondary | ICD-10-CM | POA: Diagnosis not present

## 2021-07-04 DIAGNOSIS — T40605A Adverse effect of unspecified narcotics, initial encounter: Secondary | ICD-10-CM | POA: Diagnosis not present

## 2021-07-04 DIAGNOSIS — Z87891 Personal history of nicotine dependence: Secondary | ICD-10-CM

## 2021-07-04 DIAGNOSIS — E7800 Pure hypercholesterolemia, unspecified: Secondary | ICD-10-CM

## 2021-07-04 DIAGNOSIS — E785 Hyperlipidemia, unspecified: Secondary | ICD-10-CM | POA: Diagnosis present

## 2021-07-04 DIAGNOSIS — R414 Neurologic neglect syndrome: Secondary | ICD-10-CM | POA: Diagnosis not present

## 2021-07-04 DIAGNOSIS — R739 Hyperglycemia, unspecified: Secondary | ICD-10-CM | POA: Diagnosis not present

## 2021-07-04 DIAGNOSIS — Z85820 Personal history of malignant melanoma of skin: Secondary | ICD-10-CM

## 2021-07-04 DIAGNOSIS — R404 Transient alteration of awareness: Secondary | ICD-10-CM | POA: Diagnosis not present

## 2021-07-04 DIAGNOSIS — I10 Essential (primary) hypertension: Secondary | ICD-10-CM | POA: Diagnosis not present

## 2021-07-04 DIAGNOSIS — I672 Cerebral atherosclerosis: Secondary | ICD-10-CM | POA: Diagnosis not present

## 2021-07-04 DIAGNOSIS — E871 Hypo-osmolality and hyponatremia: Secondary | ICD-10-CM | POA: Diagnosis present

## 2021-07-04 DIAGNOSIS — I6340 Cerebral infarction due to embolism of unspecified cerebral artery: Secondary | ICD-10-CM | POA: Diagnosis not present

## 2021-07-04 DIAGNOSIS — Z743 Need for continuous supervision: Secondary | ICD-10-CM | POA: Diagnosis not present

## 2021-07-04 DIAGNOSIS — Z888 Allergy status to other drugs, medicaments and biological substances status: Secondary | ICD-10-CM

## 2021-07-04 DIAGNOSIS — R4182 Altered mental status, unspecified: Secondary | ICD-10-CM

## 2021-07-04 DIAGNOSIS — R29818 Other symptoms and signs involving the nervous system: Secondary | ICD-10-CM | POA: Diagnosis not present

## 2021-07-04 DIAGNOSIS — R29707 NIHSS score 7: Secondary | ICD-10-CM | POA: Diagnosis not present

## 2021-07-04 DIAGNOSIS — I6523 Occlusion and stenosis of bilateral carotid arteries: Secondary | ICD-10-CM | POA: Diagnosis not present

## 2021-07-04 DIAGNOSIS — R29810 Facial weakness: Secondary | ICD-10-CM | POA: Diagnosis not present

## 2021-07-04 DIAGNOSIS — G928 Other toxic encephalopathy: Secondary | ICD-10-CM | POA: Diagnosis present

## 2021-07-04 DIAGNOSIS — Z8249 Family history of ischemic heart disease and other diseases of the circulatory system: Secondary | ICD-10-CM

## 2021-07-04 DIAGNOSIS — Z9049 Acquired absence of other specified parts of digestive tract: Secondary | ICD-10-CM

## 2021-07-04 DIAGNOSIS — Z6826 Body mass index (BMI) 26.0-26.9, adult: Secondary | ICD-10-CM

## 2021-07-04 DIAGNOSIS — G8194 Hemiplegia, unspecified affecting left nondominant side: Secondary | ICD-10-CM | POA: Diagnosis not present

## 2021-07-04 DIAGNOSIS — Z885 Allergy status to narcotic agent status: Secondary | ICD-10-CM

## 2021-07-04 DIAGNOSIS — G4733 Obstructive sleep apnea (adult) (pediatric): Secondary | ICD-10-CM | POA: Diagnosis present

## 2021-07-04 DIAGNOSIS — R441 Visual hallucinations: Secondary | ICD-10-CM | POA: Diagnosis present

## 2021-07-04 DIAGNOSIS — E860 Dehydration: Secondary | ICD-10-CM | POA: Diagnosis not present

## 2021-07-04 DIAGNOSIS — E669 Obesity, unspecified: Secondary | ICD-10-CM | POA: Diagnosis present

## 2021-07-04 DIAGNOSIS — R29706 NIHSS score 6: Secondary | ICD-10-CM | POA: Diagnosis not present

## 2021-07-04 DIAGNOSIS — I6389 Other cerebral infarction: Secondary | ICD-10-CM | POA: Diagnosis not present

## 2021-07-04 DIAGNOSIS — R29704 NIHSS score 4: Secondary | ICD-10-CM | POA: Diagnosis not present

## 2021-07-04 DIAGNOSIS — K219 Gastro-esophageal reflux disease without esophagitis: Secondary | ICD-10-CM | POA: Diagnosis present

## 2021-07-04 DIAGNOSIS — Z933 Colostomy status: Secondary | ICD-10-CM

## 2021-07-04 DIAGNOSIS — Z7984 Long term (current) use of oral hypoglycemic drugs: Secondary | ICD-10-CM

## 2021-07-04 DIAGNOSIS — Z79899 Other long term (current) drug therapy: Secondary | ICD-10-CM

## 2021-07-04 DIAGNOSIS — R946 Abnormal results of thyroid function studies: Secondary | ICD-10-CM | POA: Diagnosis present

## 2021-07-04 DIAGNOSIS — Z79890 Hormone replacement therapy (postmenopausal): Secondary | ICD-10-CM

## 2021-07-04 LAB — URINALYSIS, ROUTINE W REFLEX MICROSCOPIC
Bacteria, UA: NONE SEEN
Bilirubin Urine: NEGATIVE
Glucose, UA: >=500 mg/dL — AB
Ketones, ur: 20 mg/dL — AB
Leukocytes,Ua: NEGATIVE
Nitrite: NEGATIVE
Protein, ur: 100 mg/dL — AB
RBC / HPF: >50 RBC/hpf — ABNORMAL HIGH (ref 0–5)
Specific Gravity, Urine: 1.012 (ref 1.005–1.030)
pH: 6 (ref 5.0–8.0)

## 2021-07-04 LAB — CBC WITH DIFFERENTIAL/PLATELET
Abs Immature Granulocytes: 0.04 10*3/uL (ref 0.00–0.07)
Basophils Absolute: 0 10*3/uL (ref 0.0–0.1)
Basophils Relative: 0 %
Eosinophils Absolute: 0 10*3/uL (ref 0.0–0.5)
Eosinophils Relative: 0 %
HCT: 41.1 % (ref 36.0–46.0)
Hemoglobin: 14 g/dL (ref 12.0–15.0)
Immature Granulocytes: 0 %
Lymphocytes Relative: 12 %
Lymphs Abs: 1.3 10*3/uL (ref 0.7–4.0)
MCH: 28.8 pg (ref 26.0–34.0)
MCHC: 34.1 g/dL (ref 30.0–36.0)
MCV: 84.6 fL (ref 80.0–100.0)
Monocytes Absolute: 0.6 10*3/uL (ref 0.1–1.0)
Monocytes Relative: 5 %
Neutro Abs: 9.4 10*3/uL — ABNORMAL HIGH (ref 1.7–7.7)
Neutrophils Relative %: 83 %
Platelets: 428 10*3/uL — ABNORMAL HIGH (ref 150–400)
RBC: 4.86 MIL/uL (ref 3.87–5.11)
RDW: 12.9 % (ref 11.5–15.5)
WBC: 11.4 10*3/uL — ABNORMAL HIGH (ref 4.0–10.5)
nRBC: 0 % (ref 0.0–0.2)

## 2021-07-04 LAB — RESP PANEL BY RT-PCR (FLU A&B, COVID) ARPGX2
Influenza A by PCR: NEGATIVE
Influenza B by PCR: NEGATIVE
SARS Coronavirus 2 by RT PCR: NEGATIVE

## 2021-07-04 LAB — BASIC METABOLIC PANEL
Anion gap: 16 — ABNORMAL HIGH (ref 5–15)
BUN: 15 mg/dL (ref 8–23)
CO2: 26 mmol/L (ref 22–32)
Calcium: 9.4 mg/dL (ref 8.9–10.3)
Chloride: 89 mmol/L — ABNORMAL LOW (ref 98–111)
Creatinine, Ser: 0.92 mg/dL (ref 0.44–1.00)
GFR, Estimated: >60 mL/min (ref >60)
Glucose, Bld: 295 mg/dL — ABNORMAL HIGH (ref 70–99)
Potassium: 3.4 mmol/L — ABNORMAL LOW (ref 3.5–5.1)
Sodium: 131 mmol/L — ABNORMAL LOW (ref 135–145)

## 2021-07-04 LAB — MAGNESIUM: Magnesium: 1.2 mg/dL — ABNORMAL LOW (ref 1.7–2.4)

## 2021-07-04 LAB — CBG MONITORING, ED: Glucose-Capillary: 89 mg/dL (ref 70–99)

## 2021-07-04 IMAGING — MR MR HEAD W/O CM
7 of 10 series · 30 of 48 positions shown · non-contrast
Comparison: Noncontrast CT head [DATE]

CLINICAL DATA: Neuro deficit, stroke suspected

EXAM:
MRI HEAD WITHOUT CONTRAST
TECHNIQUE: Multiplanar, multiecho pulse sequences of the brain and surrounding
structures were obtained without intravenous contrast.

[Series 2: DWI · axial · 3.0mm · 0.94mm/px · z∈[-35,+112]mm · 8 of 106 slices shown (1 of 2)]
[im 1/106]
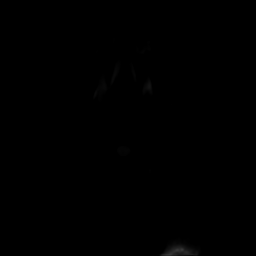
[im 12/106]
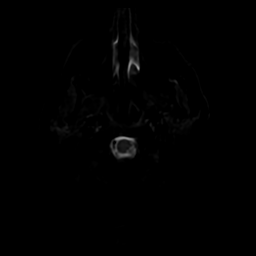
[im 36/106]
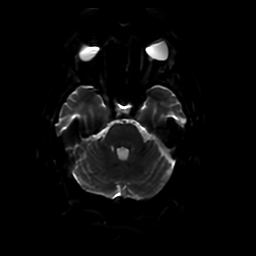
[im 47/106]
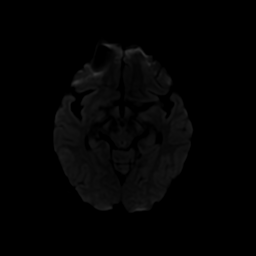
[im 59/106]
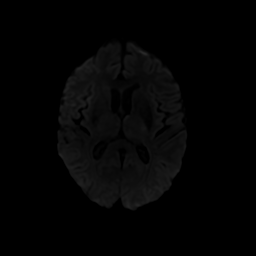
[im 71/106]
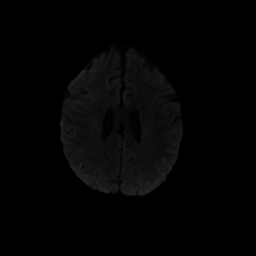
[im 94/106]
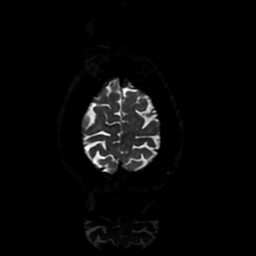
[im 106/106]
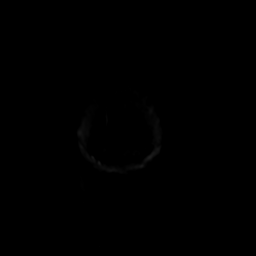

[Series 3: DWI · coronal · 4.0mm · 0.94mm/px · 7 of 73 slices shown (2 of 2)]
[im 1/73]
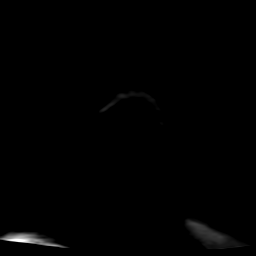
[im 13/73]
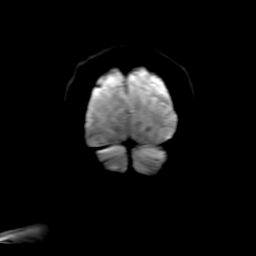
[im 25/73]
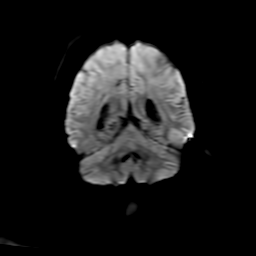
[im 37/73]
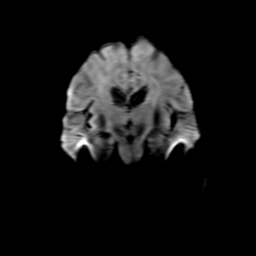
[im 49/73]
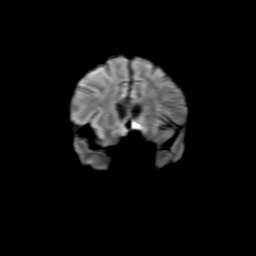
[im 61/73]
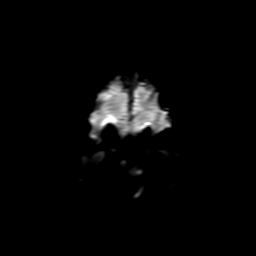
[im 73/73]
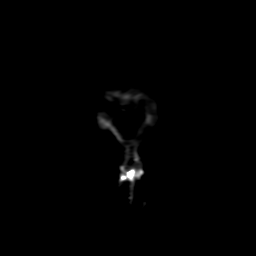

[Series 4: FLAIR · sagittal · 5.0mm · 0.23mm/px · 2 of 25 slices shown (1 of 2)]
[im 1/25]
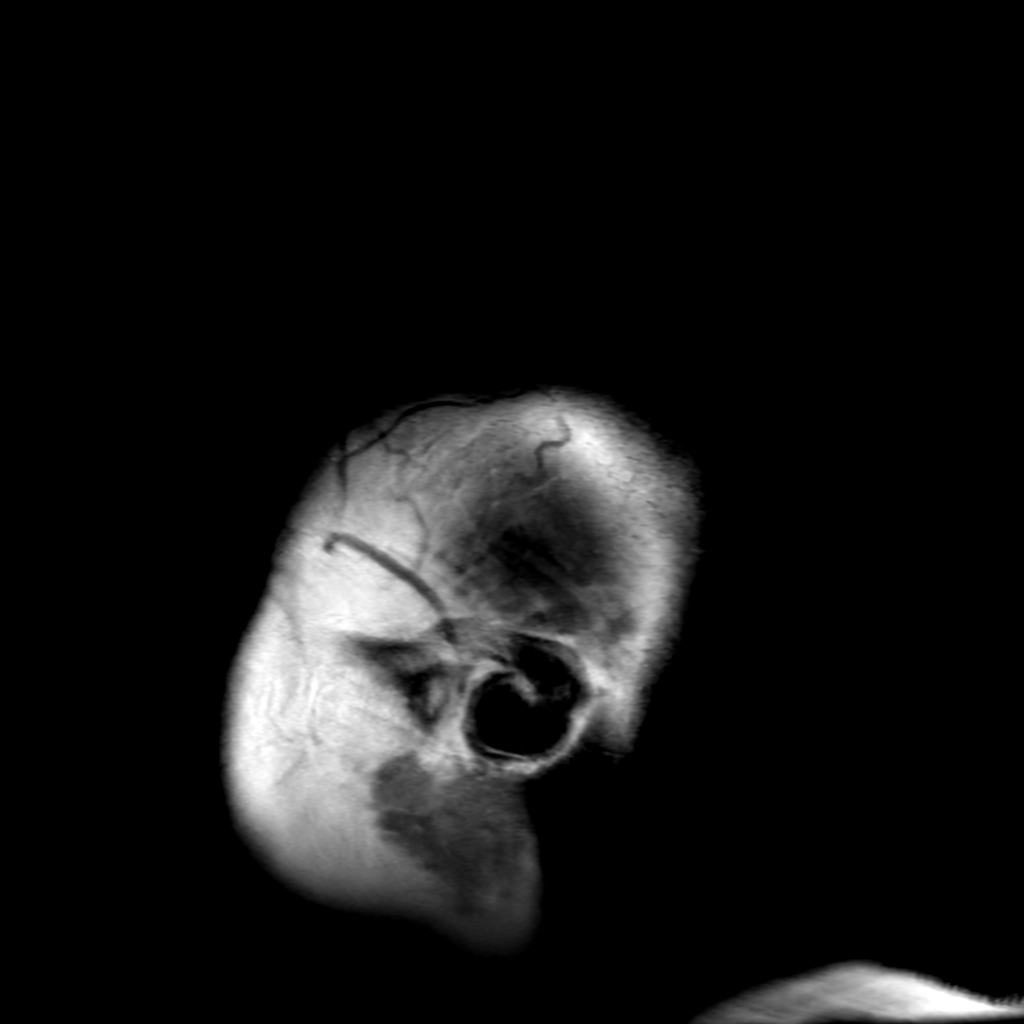
[im 25/25]
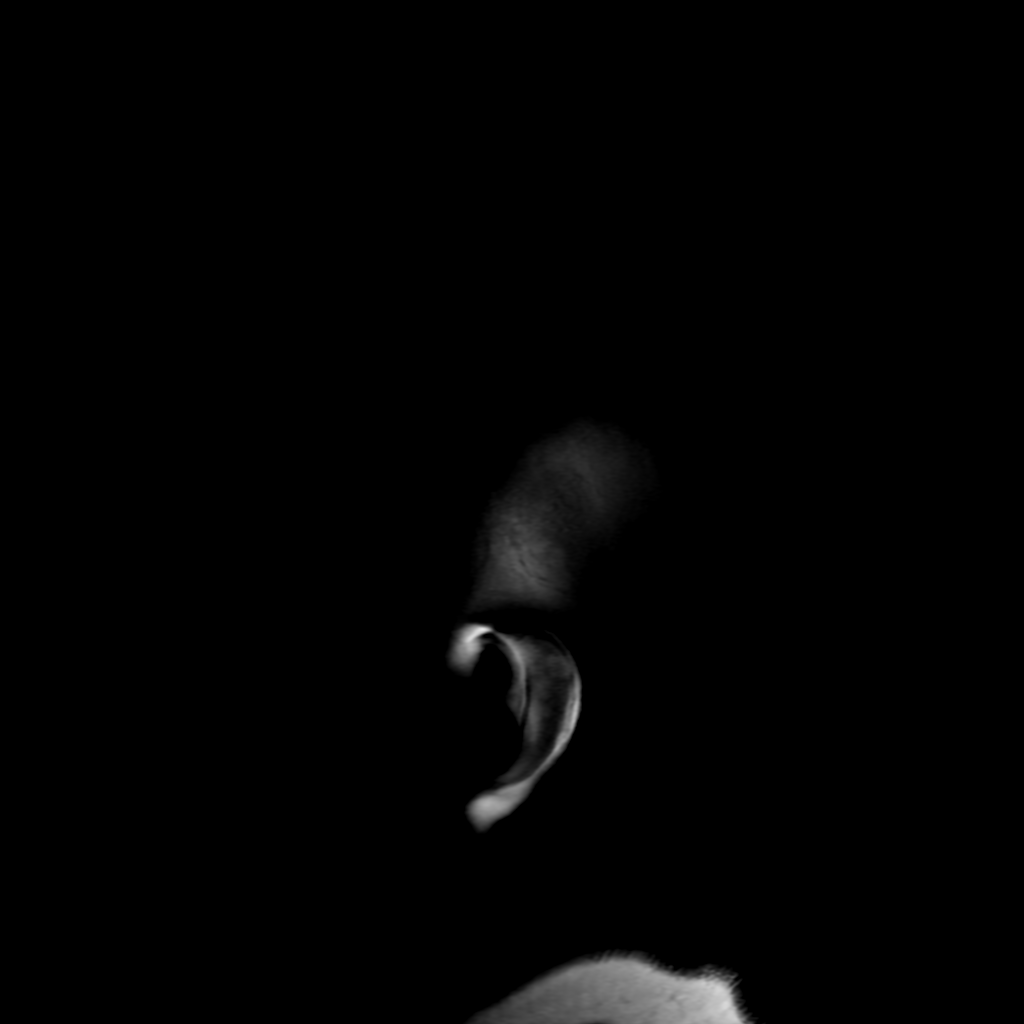

[Series 5: T2 · axial · 5.0mm · 0.47mm/px · z∈[-41,+112]mm · 3 of 29 slices shown]
[im 1/29]
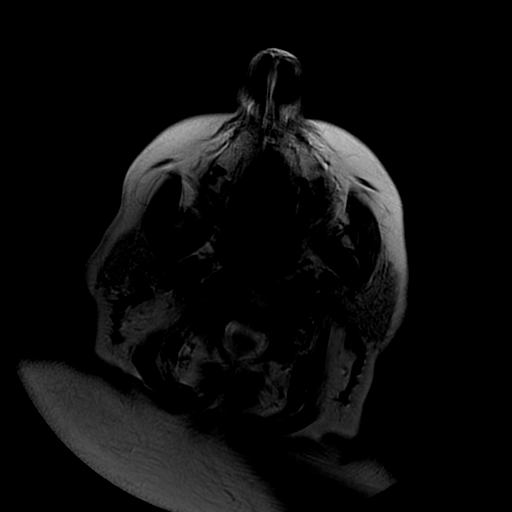
[im 15/29]
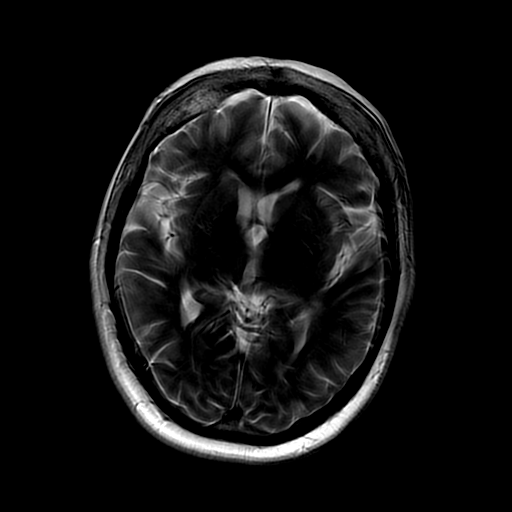
[im 29/29]
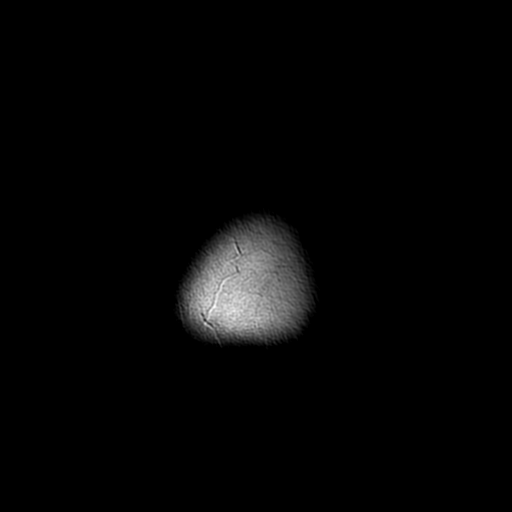

[Series 7: FLAIR · axial · 5.0mm · 0.47mm/px · z∈[-23,+125]mm · 2 of 27 slices shown (2 of 2)]
[im 1/27]
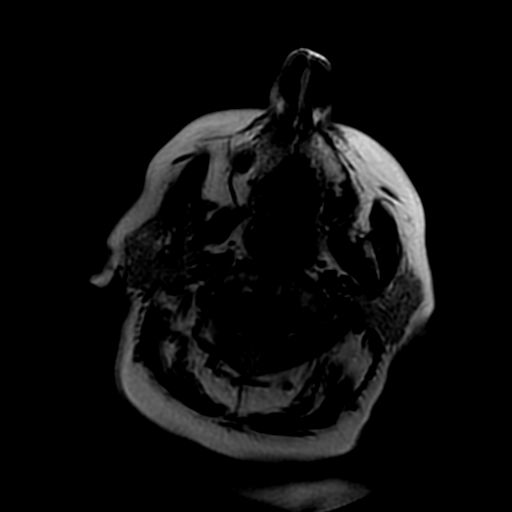
[im 27/27]
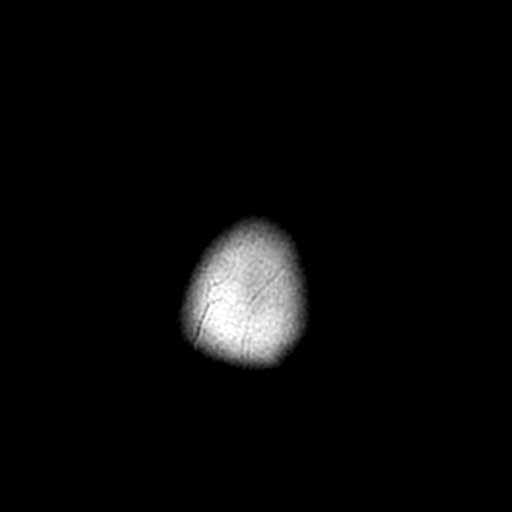

[Series 250: ADC · axial · 3.0mm · 0.94mm/px · z∈[-35,+112]mm · 5 of 52 slices shown (1 of 2)]
[im 1/52]
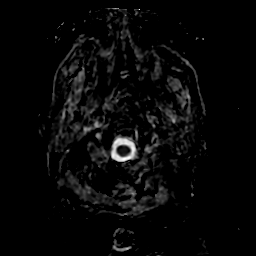
[im 13/52]
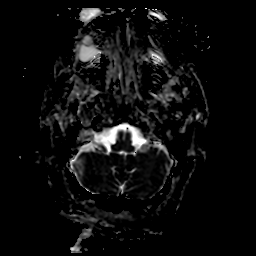
[im 26/52]
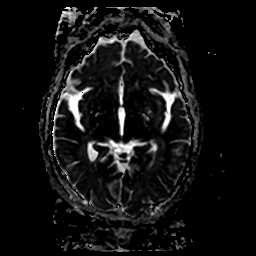
[im 39/52]
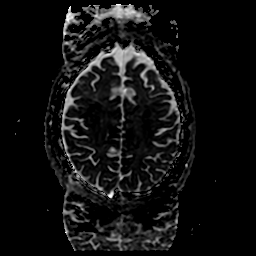
[im 52/52]
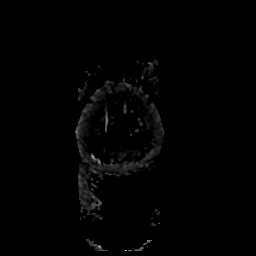

[Series 350: ADC · coronal · 4.0mm · 0.94mm/px · 3 of 36 slices shown (2 of 2)]
[im 1/36]
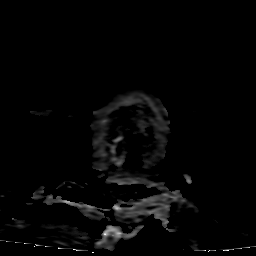
[im 18/36]
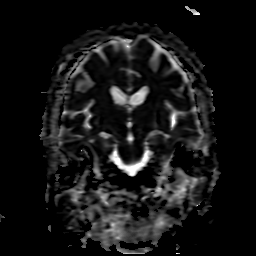
[im 36/36]
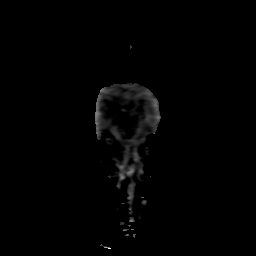

[30 of 48 positions shown; findings below may reference images not displayed]

FINDINGS: Brain: There is no evidence of acute intracranial hemorrhage,
extra-axial fluid collection, or acute infarct.

Background parenchymal volume is normal. The ventricles are normal
in size. There is a minimal burden of white matter microangiopathic
change. There is no suspicious parenchymal signal abnormality.

There is no mass lesion. There is no midline shift. There is an
enlarged and partially empty sella.

Vascular: Normal flow voids.

Skull and upper cervical spine: Normal marrow signal.

Sinuses/Orbits: The paranasal sinuses are clear. The globes and
orbits are unremarkable.

Other: None.
IMPRESSION: No acute intracranial pathology.

## 2021-07-04 IMAGING — CR DG CHEST 2V
2 series · 2 of 2 positions shown · non-contrast
Comparison: [DATE]

CLINICAL DATA: Altered mental status

EXAM:
CHEST - 2 VIEW

[chest lat]
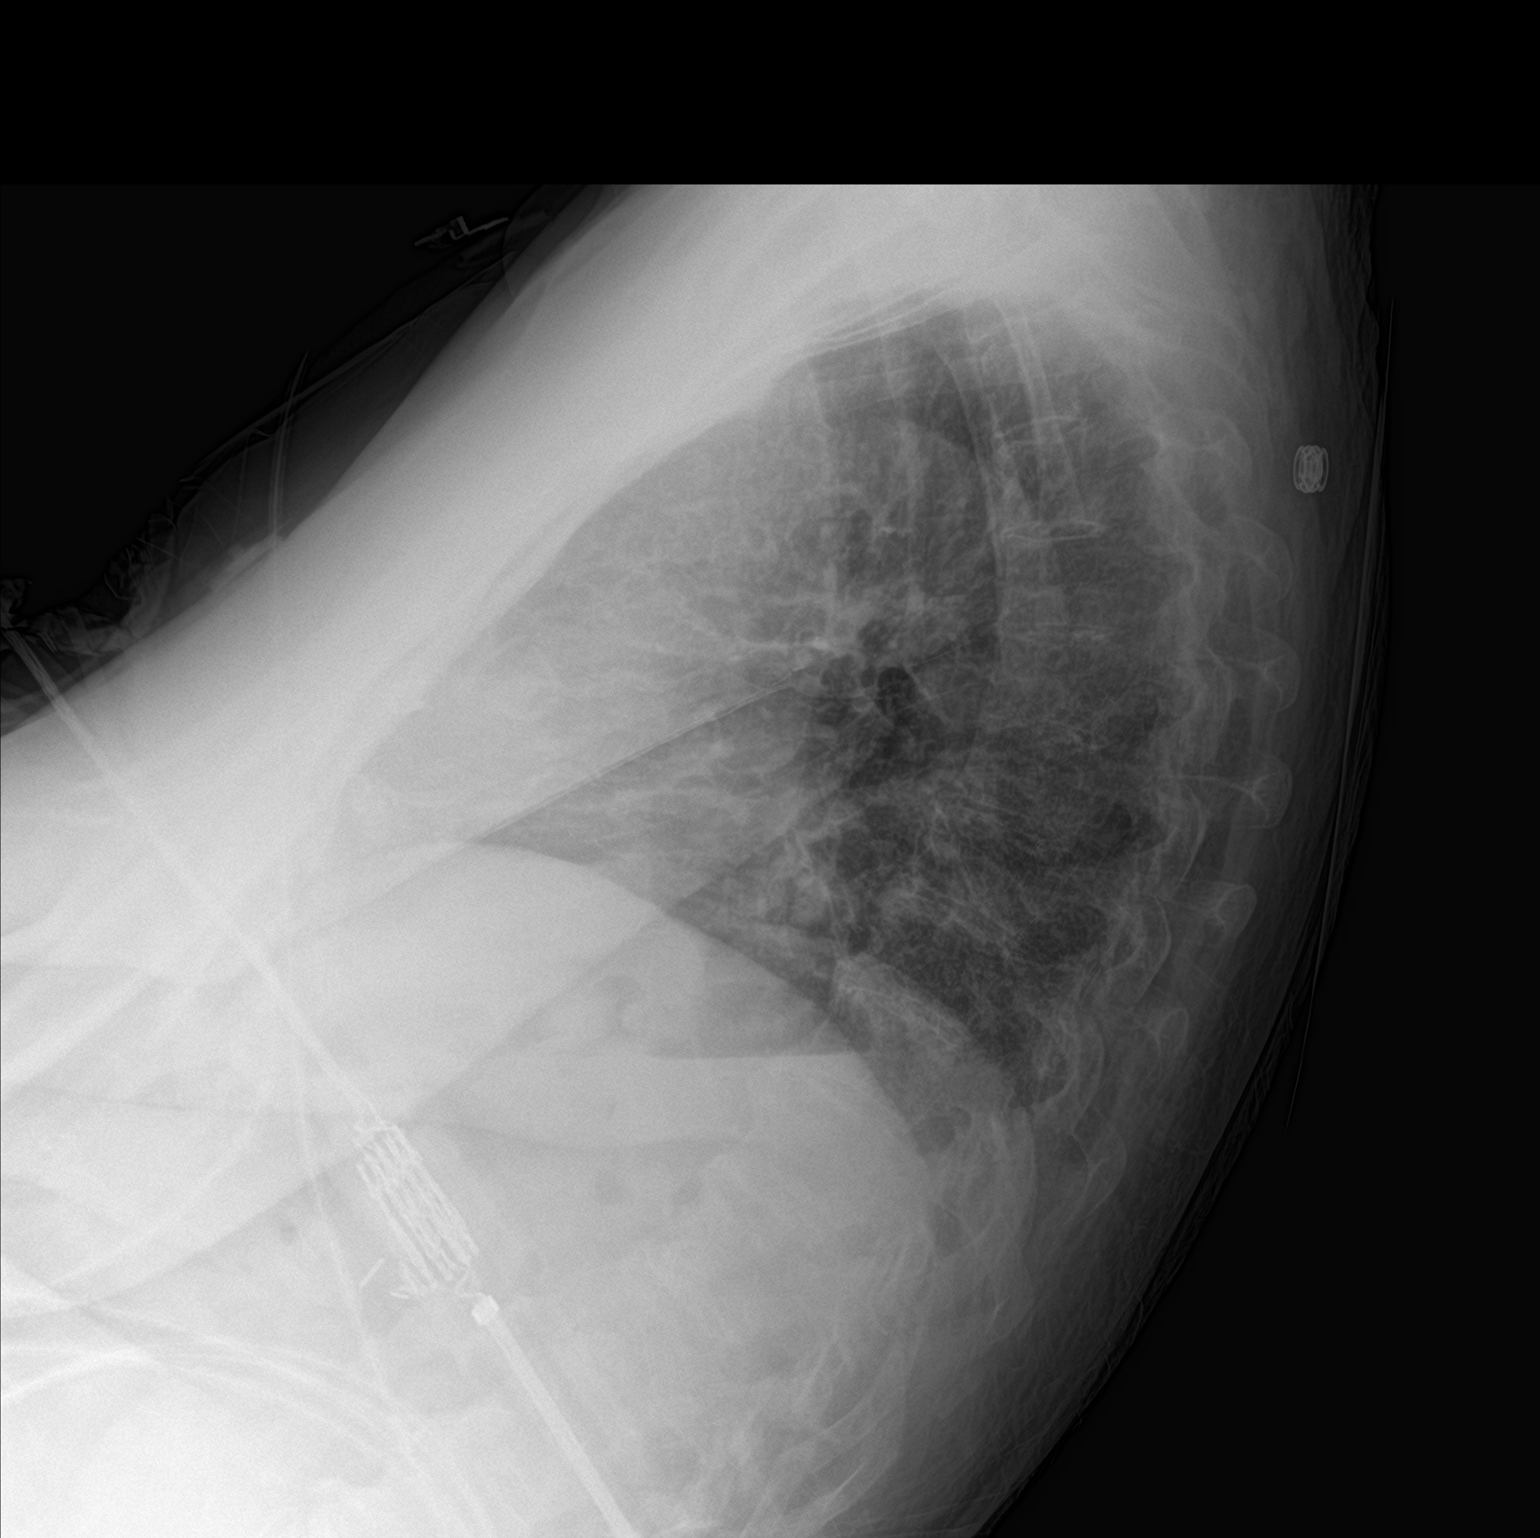

[chest ap]
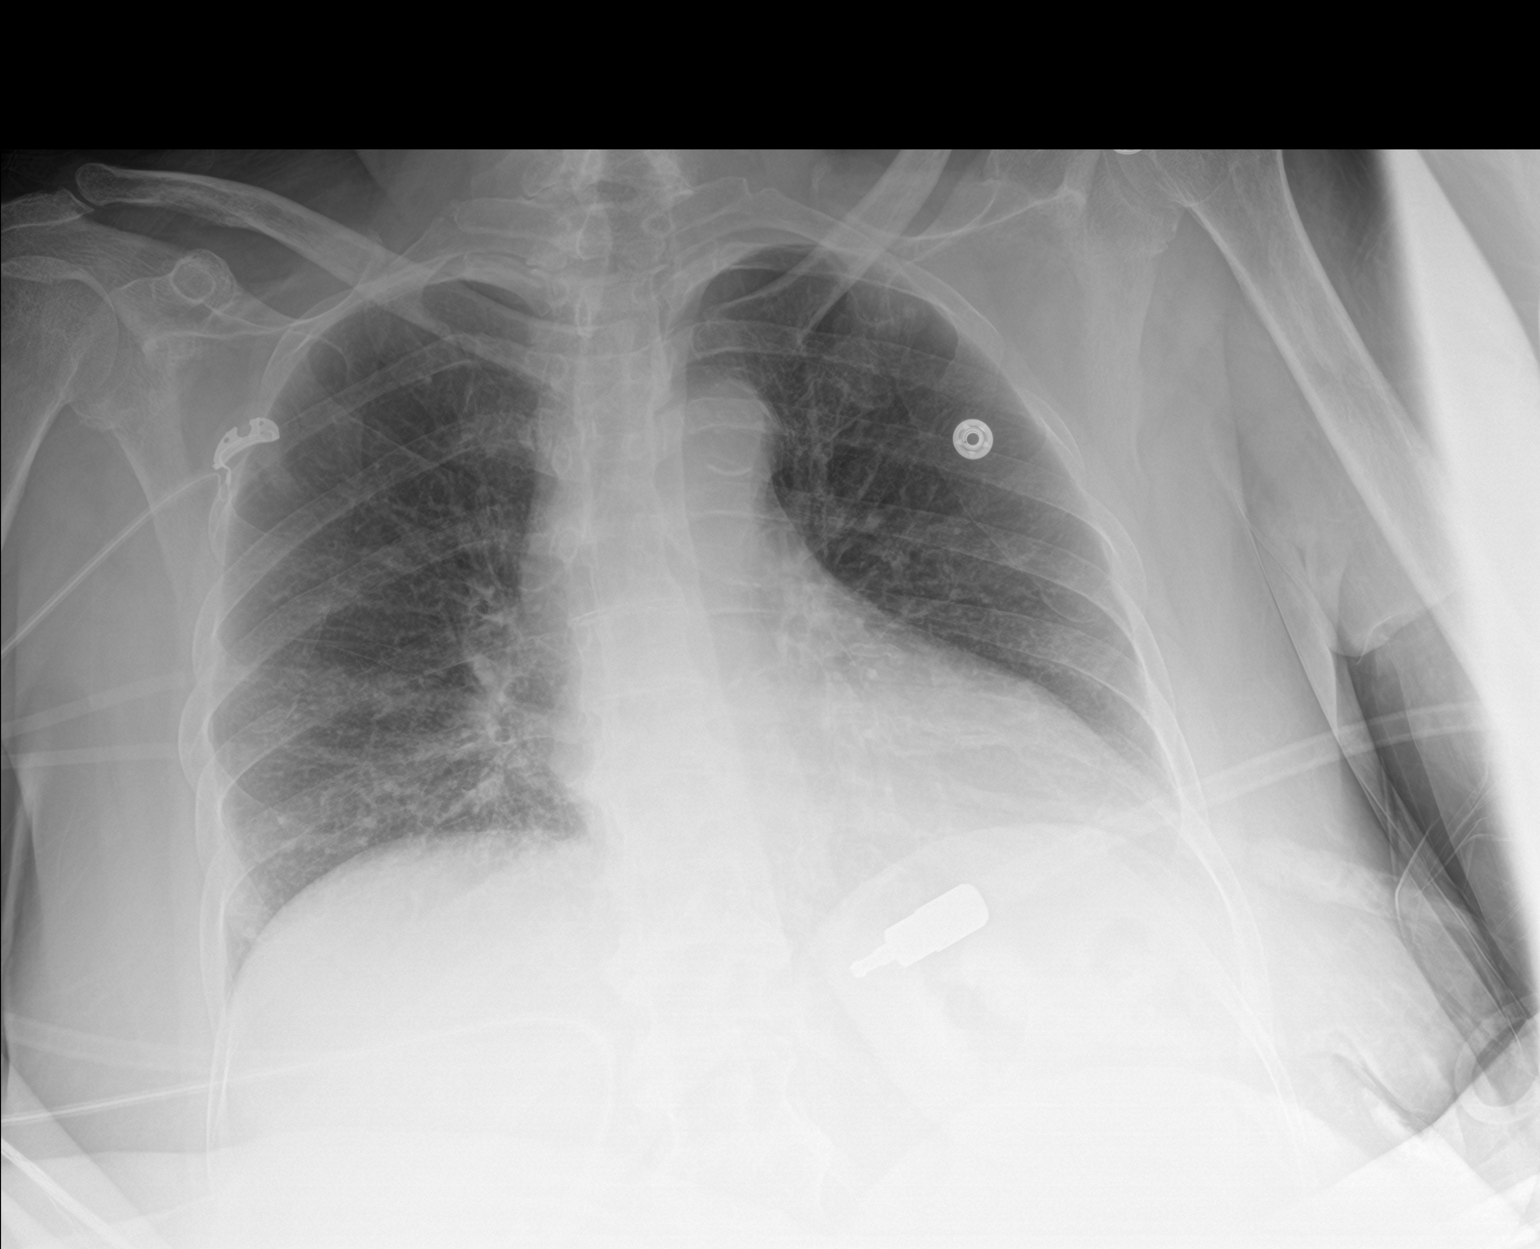

[2 of 2 positions shown; findings below may reference images not displayed]

FINDINGS: Transverse diameter of heart is slightly increased. There are no
signs of pulmonary edema or focal pulmonary consolidation. There is
no pleural effusion or pneumothorax.
IMPRESSION: No active cardiopulmonary disease.

## 2021-07-04 IMAGING — CT CT ANGIO HEAD-NECK (W OR W/O PERF)
2 of 11 series · 6 of 35 positions shown · IV contrast (OMNI 350)
Comparison: None.

CLINICAL DATA: Altered mental status

EXAM:
CT ANGIOGRAPHY HEAD AND NECK
TECHNIQUE: Multidetector CT imaging of the head and neck was performed using
the standard protocol during bolus administration of intravenous
contrast. Multiplanar CT image reconstructions and MIPs were
obtained to evaluate the vascular anatomy. Carotid stenosis
measurements (when applicable) are obtained utilizing NASCET
criteria, using the distal internal carotid diameter as the
denominator.

[Series 13: cta neck axial · axial · 0.39mm/px · z∈[+176,+408]mm · 5 of 352 slices shown]
[im 59/352  soft-tissue]
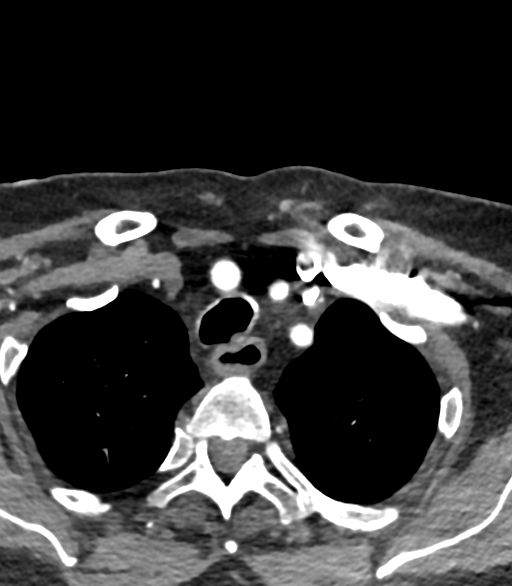
[im 118/352  bone]
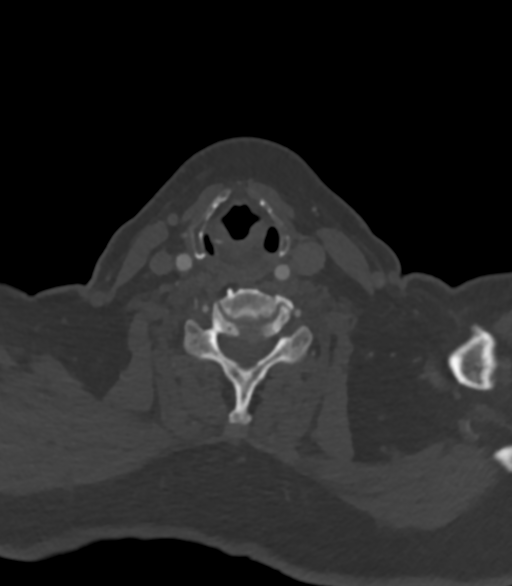
[im 176/352  soft-tissue]
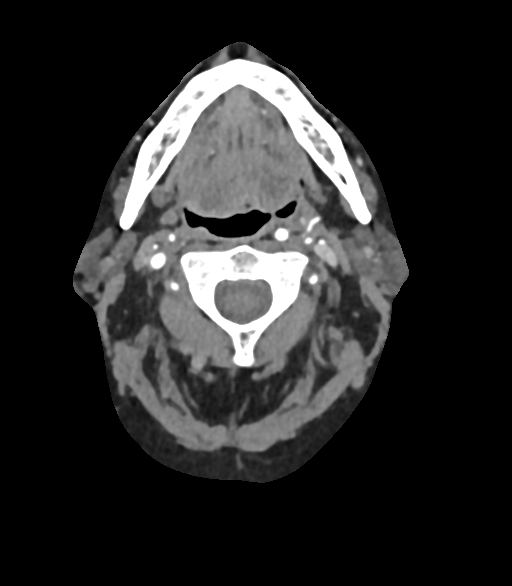
[im 235/352  bone]
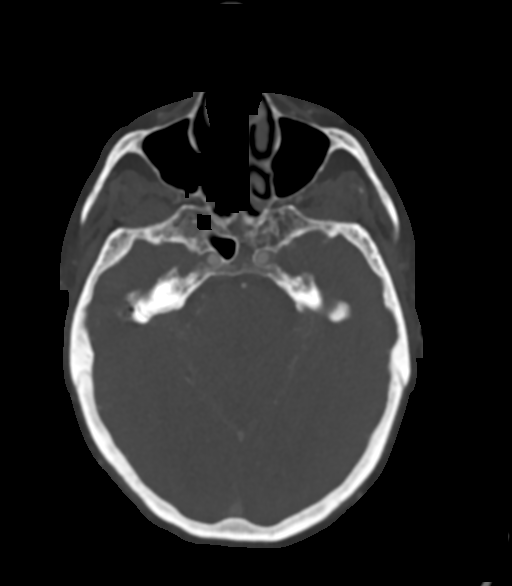
[im 293/352  soft-tissue]
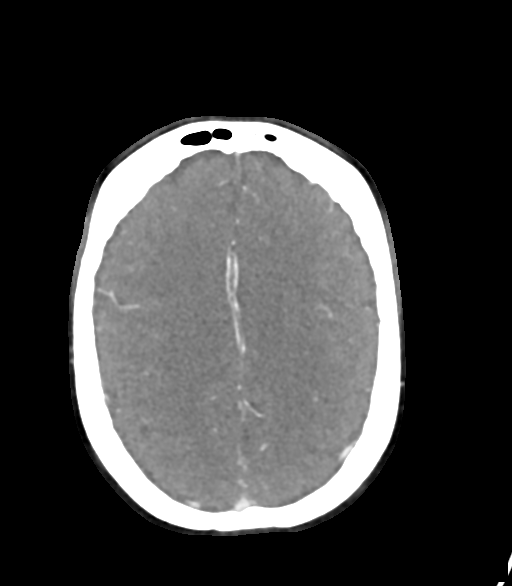

[Series 15: cta neck sagittal · sagittal · 0.46mm/px · 1 of 201 slices shown]
[im 119/201  soft-tissue]
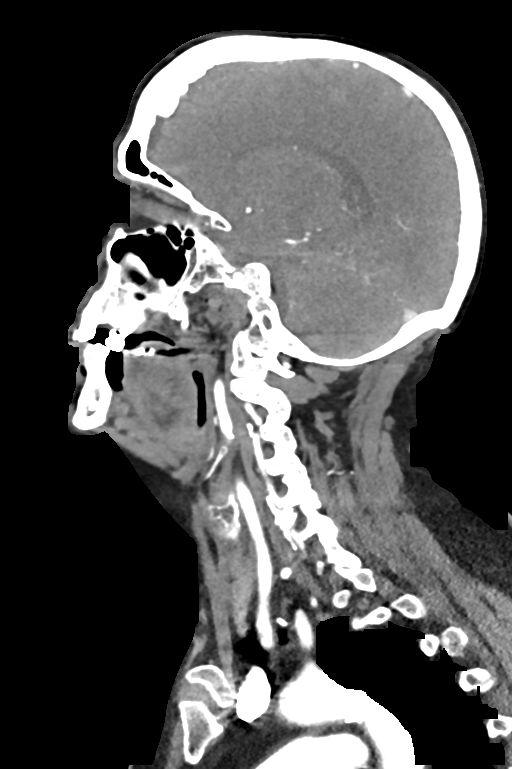

[6 of 35 positions shown; findings below may reference images not displayed]

RADIATION DOSE REDUCTION: This exam was performed according to the
departmental dose-optimization program which includes automated
exposure control, adjustment of the mA and/or kV according to
patient size and/or use of iterative reconstruction technique.

CONTRAST:  75mL OMNIPAQUE IOHEXOL 350 MG/ML SOLN
FINDINGS: CT HEAD FINDINGS

Brain: There is no mass, hemorrhage or extra-axial collection. The
size and configuration of the ventricles and extra-axial CSF spaces
are normal. There is no acute or chronic infarction. The brain
parenchyma is normal.

Skull: The visualized skull base, calvarium and extracranial soft
tissues are normal.

Sinuses/Orbits: No fluid levels or advanced mucosal thickening of
the visualized paranasal sinuses. No mastoid or middle ear effusion.
The orbits are normal.

CTA NECK FINDINGS

SKELETON: There is no bony spinal canal stenosis. No lytic or
blastic lesion.

OTHER NECK: Normal pharynx, larynx and major salivary glands. No
cervical lymphadenopathy. Unremarkable thyroid gland.

UPPER CHEST: No pneumothorax or pleural effusion. No nodules or
masses.

AORTIC ARCH:

There is calcific atherosclerosis of the aortic arch. There is no
aneurysm, dissection or hemodynamically significant stenosis of the
visualized portion of the aorta. Conventional 3 vessel aortic
branching pattern. The visualized proximal subclavian arteries are
widely patent.

RIGHT CAROTID SYSTEM: No dissection, occlusion or aneurysm. Mild
atherosclerotic calcification at the carotid bifurcation without
hemodynamically significant stenosis.

LEFT CAROTID SYSTEM: No dissection, occlusion or aneurysm. Mild
atherosclerotic calcification at the carotid bifurcation without
hemodynamically significant stenosis.

VERTEBRAL ARTERIES: Left dominant configuration. Both origins are
clearly patent. There is no dissection, occlusion or flow-limiting
stenosis to the skull base (V1-V3 segments).

CTA HEAD FINDINGS

POSTERIOR CIRCULATION:

--Vertebral arteries: Normal V4 segments.

--Inferior cerebellar arteries: Normal.

--Basilar artery: Normal.

--Superior cerebellar arteries: Normal.

--Posterior cerebral arteries (PCA): Normal.

ANTERIOR CIRCULATION:

--Intracranial internal carotid arteries: Normal.

--Anterior cerebral arteries (ACA): Normal. Both A1 segments are
present. Patent anterior communicating artery (a-comm).

--Middle cerebral arteries (MCA): Normal.

VENOUS SINUSES: As permitted by contrast timing, patent.

ANATOMIC VARIANTS: None

Review of the MIP images confirms the above findings.
IMPRESSION: 1. No emergent large vessel occlusion or hemodynamically significant
stenosis of the head or neck.

Aortic Atherosclerosis ([0L]-[0L]).

## 2021-07-04 MED ORDER — SODIUM CHLORIDE 0.9 % IV BOLUS
1000.0000 mL | Freq: Once | INTRAVENOUS | Status: AC
  Administered 2021-07-04: 1000 mL via INTRAVENOUS

## 2021-07-04 MED ORDER — IOHEXOL 350 MG/ML SOLN
75.0000 mL | Freq: Once | INTRAVENOUS | Status: AC | PRN
  Administered 2021-07-04: 75 mL via INTRAVENOUS

## 2021-07-04 MED ORDER — POTASSIUM CHLORIDE 10 MEQ/100ML IV SOLN
10.0000 meq | Freq: Once | INTRAVENOUS | Status: AC
  Administered 2021-07-04: 10 meq via INTRAVENOUS
  Filled 2021-07-04: qty 100

## 2021-07-04 MED ORDER — FENTANYL CITRATE PF 50 MCG/ML IJ SOSY
50.0000 ug | PREFILLED_SYRINGE | Freq: Once | INTRAMUSCULAR | Status: AC
  Administered 2021-07-04: 50 ug via INTRAVENOUS
  Filled 2021-07-04: qty 1

## 2021-07-04 MED ORDER — ONDANSETRON HCL 4 MG/2ML IJ SOLN: 4.0000 mg | Freq: Four times a day (QID) | INTRAMUSCULAR | Status: DC | PRN

## 2021-07-04 MED ORDER — LORAZEPAM 2 MG/ML IJ SOLN
1.0000 mg | Freq: Once | INTRAMUSCULAR | Status: AC
  Administered 2021-07-04: 1 mg via INTRAVENOUS
  Filled 2021-07-04: qty 1

## 2021-07-04 MED ORDER — SODIUM CHLORIDE 0.9 % IV SOLN: INTRAVENOUS | Status: DC

## 2021-07-04 MED ORDER — ALBUTEROL SULFATE (2.5 MG/3ML) 0.083% IN NEBU: 2.5000 mg | INHALATION_SOLUTION | Freq: Four times a day (QID) | RESPIRATORY_TRACT | Status: DC | PRN

## 2021-07-04 MED ORDER — MAGNESIUM SULFATE 2 GM/50ML IV SOLN
2.0000 g | Freq: Once | INTRAVENOUS | Status: AC
Start: 2021-07-04 — End: 2021-07-04
  Administered 2021-07-04: 2 g via INTRAVENOUS
  Filled 2021-07-04: qty 50

## 2021-07-04 MED ORDER — METOCLOPRAMIDE HCL 5 MG/ML IJ SOLN
10.0000 mg | Freq: Once | INTRAMUSCULAR | Status: AC
  Administered 2021-07-04: 10 mg via INTRAVENOUS
  Filled 2021-07-04: qty 2

## 2021-07-04 MED ORDER — LISINOPRIL 10 MG PO TABS
10.0000 mg | ORAL_TABLET | Freq: Every day | ORAL | Status: DC
  Administered 2021-07-05: 10 mg via ORAL
  Filled 2021-07-04: qty 1

## 2021-07-04 MED ORDER — EZETIMIBE 10 MG PO TABS
10.0000 mg | ORAL_TABLET | Freq: Every day | ORAL | Status: DC
  Administered 2021-07-05 – 2021-07-08 (×4): 10 mg via ORAL
  Filled 2021-07-04 (×5): qty 1

## 2021-07-04 MED ORDER — ONDANSETRON HCL 4 MG PO TABS: 4.0000 mg | ORAL_TABLET | Freq: Four times a day (QID) | ORAL | Status: DC | PRN

## 2021-07-04 MED ORDER — ENOXAPARIN SODIUM 40 MG/0.4ML IJ SOSY
40.0000 mg | PREFILLED_SYRINGE | INTRAMUSCULAR | Status: DC
  Administered 2021-07-04 – 2021-07-05 (×2): 40 mg via SUBCUTANEOUS
  Filled 2021-07-04 (×2): qty 0.4

## 2021-07-04 MED ORDER — INSULIN ASPART 100 UNIT/ML IJ SOLN: 4.0000 [IU] | Freq: Once | INTRAMUSCULAR | Status: DC

## 2021-07-04 MED ORDER — ACETAMINOPHEN 650 MG RE SUPP: 650.0000 mg | Freq: Four times a day (QID) | RECTAL | Status: DC | PRN

## 2021-07-04 MED ORDER — LORAZEPAM 2 MG/ML IJ SOLN
0.5000 mg | Freq: Four times a day (QID) | INTRAMUSCULAR | Status: DC | PRN
  Filled 2021-07-04 (×2): qty 1

## 2021-07-04 MED ORDER — LISINOPRIL-HYDROCHLOROTHIAZIDE 10-12.5 MG PO TABS: 1.0000 | ORAL_TABLET | Freq: Every day | ORAL | Status: DC

## 2021-07-04 MED ORDER — LORAZEPAM 2 MG/ML IJ SOLN
1.0000 mg | Freq: Once | INTRAMUSCULAR | Status: AC
Start: 2021-07-04 — End: 2021-07-04
  Administered 2021-07-04: 1 mg via INTRAVENOUS
  Filled 2021-07-04: qty 1

## 2021-07-04 MED ORDER — POTASSIUM CHLORIDE 10 MEQ/100ML IV SOLN
10.0000 meq | INTRAVENOUS | Status: DC
  Administered 2021-07-05: 10 meq via INTRAVENOUS

## 2021-07-04 MED ORDER — ASPIRIN 81 MG PO CHEW
81.0000 mg | CHEWABLE_TABLET | Freq: Every day | ORAL | Status: DC
  Administered 2021-07-05 – 2021-07-08 (×4): 81 mg via ORAL
  Filled 2021-07-04 (×4): qty 1

## 2021-07-04 MED ORDER — INSULIN ASPART 100 UNIT/ML IJ SOLN
0.0000 [IU] | INTRAMUSCULAR | Status: DC
  Administered 2021-07-05: 1 [IU] via SUBCUTANEOUS
  Administered 2021-07-05 (×2): 2 [IU] via SUBCUTANEOUS
  Administered 2021-07-06: 1 [IU] via SUBCUTANEOUS
  Administered 2021-07-06: 2 [IU] via SUBCUTANEOUS
  Administered 2021-07-06: 1 [IU] via SUBCUTANEOUS
  Administered 2021-07-06: 3 [IU] via SUBCUTANEOUS
  Administered 2021-07-06: 1 [IU] via SUBCUTANEOUS
  Administered 2021-07-07 (×2): 2 [IU] via SUBCUTANEOUS
  Administered 2021-07-07: 1 [IU] via SUBCUTANEOUS
  Administered 2021-07-07: 5 [IU] via SUBCUTANEOUS
  Administered 2021-07-07: 2 [IU] via SUBCUTANEOUS
  Administered 2021-07-08 (×2): 1 [IU] via SUBCUTANEOUS

## 2021-07-04 MED ORDER — KETOROLAC TROMETHAMINE 15 MG/ML IJ SOLN
15.0000 mg | Freq: Once | INTRAMUSCULAR | Status: AC
  Administered 2021-07-04: 15 mg via INTRAVENOUS
  Filled 2021-07-04: qty 1

## 2021-07-04 MED ORDER — HYDROCHLOROTHIAZIDE 12.5 MG PO TABS
12.5000 mg | ORAL_TABLET | Freq: Every day | ORAL | Status: DC
  Administered 2021-07-05: 12.5 mg via ORAL
  Filled 2021-07-04: qty 1

## 2021-07-04 MED ORDER — DIPHENHYDRAMINE HCL 50 MG/ML IJ SOLN
12.5000 mg | Freq: Once | INTRAMUSCULAR | Status: AC
  Administered 2021-07-04: 12.5 mg via INTRAVENOUS
  Filled 2021-07-04: qty 1

## 2021-07-04 MED ORDER — SODIUM CHLORIDE 0.9% FLUSH
3.0000 mL | Freq: Two times a day (BID) | INTRAVENOUS | Status: DC
  Administered 2021-07-05 – 2021-07-07 (×5): 3 mL via INTRAVENOUS

## 2021-07-04 MED ORDER — PRAVASTATIN SODIUM 40 MG PO TABS
40.0000 mg | ORAL_TABLET | Freq: Two times a day (BID) | ORAL | Status: DC
  Administered 2021-07-05 – 2021-07-08 (×7): 40 mg via ORAL
  Filled 2021-07-04 (×7): qty 1

## 2021-07-04 MED ORDER — ACETAMINOPHEN 325 MG PO TABS
650.0000 mg | ORAL_TABLET | Freq: Four times a day (QID) | ORAL | Status: DC | PRN
Start: 2021-07-04 — End: 2021-07-08

## 2021-07-04 MED ORDER — HYDRALAZINE HCL 20 MG/ML IJ SOLN: 10.0000 mg | INTRAMUSCULAR | Status: DC | PRN

## 2021-07-04 NOTE — Assessment & Plan Note (Addendum)
Stable, trend

## 2021-07-04 NOTE — ED Notes (Signed)
Patient transported to MRI 

## 2021-07-04 NOTE — Assessment & Plan Note (Signed)
-  Continue pravastatin and Zetia

## 2021-07-04 NOTE — Assessment & Plan Note (Addendum)
Appreciate neurology MRI brain, CTA head and neck unremarkable Avoid narcotics, benzos Delirium precaution

## 2021-07-04 NOTE — Assessment & Plan Note (Addendum)
Sliding-scale insulin 

## 2021-07-04 NOTE — Assessment & Plan Note (Addendum)
Patient with prior history of rectal cancer status post colectomy in 2005.  Ostomy care

## 2021-07-04 NOTE — ED Notes (Signed)
Pt again up attempting to get out of bed, pulling gown and monitoring equipment off. Not redirectable.

## 2021-07-04 NOTE — H&P (Addendum)
History and Physical    Patient: Gina Villarreal SNK:[id number] DOB: 02-11-1946 DOA: 07/04/2021 DOS: the patient was seen and examined on 07/04/2021 PCP: Shirline Frees, MD  Patient coming from: Home  Chief Complaint:  Chief Complaint  Patient presents with   Altered Mental Status    HPI: Gina Villarreal is a 76 y.o. female with medical history significant of hypertension, hyperlipidemia, diabetes mellitus type 2, and rectal cancer s/p colostomy presents with complaints of headache and confusion. History is obtain by the patient's son-in-law present at bedside as she is currently altered.  Over the last couple weeks patient has been complaining of frontal headache.  Normally she is able to complete all of her ADLs without assistance and cares for her husband.  She had been treated by her primary care provider with a azithromycin for possible sinus infection.  She had been taking Sudafed and ibuprofen at home without improvement in symptoms.  She had been seen in the emergency department on 2/23.  Review of records notes that the patient complained of emesis.  Denied having any recent fever, chills, visual changes, focal neurologic deficit, chest pain, or abdominal pain.  CT scan of the head did not noted any acute abnormalities.  Patient seemed to improve after receiving Toradol and was discharged home with a prescription for oxycodone.  However, oxycodone reportedly made the patient hallucinate for which it was discontinued.  Thereafter patient has been taking tramadol without relief in symptoms.  However this morning when family came to check on the patient she was confused with slurred speech and some difficulty following commands.  Patient does not drink alcohol, do illicit drugs, and has a remote history of tobacco abuse.   On admission to the emergency department patient was noted to be afebrile, pulse 59-86, blood pressures elevated up to 208/72, and all other vital signs maintained.  Labs  significant for WBC 11.4, platelets 428, sodium 131, potassium 3.4, glucose 295, anion gap 16, and magnesium 1.2.  MRI did not note any signs of a acute stroke.  Patient has been ordered 2 L of normal saline IV fluids, Reglan, ketorolac, fentanyl, Benadryl, potassium chloride 10 meq, and magnesium sulfate 2g IV.  Review of Systems: unable to review all systems due to the inability of the patient to answer questions. Past Medical History:  Diagnosis Date   Anginal pain ([place])    Arthritis    "fingers" (02/01/2015)   Colostomy in place [facility]) since 2005   Combined hyperlipidemia    Coronary artery disease    a. 2003- BMS to RCA b. 01/2015- DES to mid LAD c. DES to mid-OM1   GERD (gastroesophageal reflux disease)    Hypertension    Hypothyroidism    Melanoma ([place])    "back; left lateral knee"   OSA on CPAP    Rectal cancer ([place]) 2005   Squamous cell carcinoma of right foot    Type II diabetes mellitus ([place])    Past Surgical History:  Procedure Laterality Date   APPENDECTOMY  1960's   BREAST CYST ASPIRATION Right 1980's   BREAST EXCISIONAL BIOPSY Right pt unsure   benign   CARDIAC CATHETERIZATION N/A 02/01/2015   Procedure: Left Heart Cath and Coronary Angiography;  Surgeon: Peter M Martinique, MD;  Location: [place] [facility];  Service: Cardiovascular;  Laterality: N/A;   CARDIAC CATHETERIZATION  02/01/2015   Procedure: Coronary Stent Intervention;  Surgeon: Peter M Martinique, MD;  Location: [place] [facility];  Service: Cardiovascular;;  COLECTOMY  2005   "rectal"   CORONARY ANGIOPLASTY WITH STENT PLACEMENT  2003; 2016   a. BMS to RCA in 2003. b. DES to mid LAD and DES to mid OM1 on 02/01/2015   LAPAROSCOPIC CHOLECYSTECTOMY  1970's   MELANOMA EXCISION     "back; left lateral knee"   RIGHT OOPHORECTOMY Right 1960's   SQUAMOUS CELL CARCINOMA EXCISION Right    "foot"   TONSILLECTOMY  1950's   Social History:  reports that she quit smoking about 20 years ago. Her smoking use included  cigarettes. She has a 40.00 pack-year smoking history. She has never used smokeless tobacco. She reports current alcohol use. She reports that she does not use drugs.  Allergies  Allergen Reactions   Metoprolol Shortness Of Breath   Morphine And Related     hallucinates    Family History  Problem Relation Age of Onset   Heart attack Father        Pacemaker implant and valve surgery    Prior to Admission medications   Medication Sig Start Date End Date Taking? Authorizing Provider  aspirin 81 MG chewable tablet Chew 1 tablet (81 mg total) by mouth daily. 02/02/15   Strader, Fransisco Hertz, PA-C  ezetimibe (ZETIA) 10 MG tablet TAKE ONE TABLET BY MOUTH DAILY 01/16/20   Martinique, Peter M, MD  glimepiride (AMARYL) 4 MG tablet Take 4 mg by mouth 2 (two) times daily.    [provider]  GLUCOSAMINE-CHONDROITIN PO Take 1 tablet by mouth daily. 1500-1200 mg    [provider]  levothyroxine (SYNTHROID) 125 MCG tablet Take 125 mcg by mouth daily. 06/08/19   [provider]  levothyroxine (SYNTHROID, LEVOTHROID) 175 MCG tablet Take 175 mcg by mouth daily. 01/05/15   [provider]  lisinopril-hydrochlorothiazide (PRINZIDE,ZESTORETIC) 10-12.5 MG tablet Take 1 tablet by mouth daily.    [provider]  metFORMIN (GLUCOPHAGE) 1000 MG tablet Take 1,000 mg by mouth 2 (two) times daily with a meal.    [provider]  nitroGLYCERIN (NITROSTAT) 0.4 MG SL tablet Place 1 tablet (0.4 mg total) under the tongue every 5 (five) minutes as needed for chest pain. 01/29/15   Skeet Latch, MD  omega-3 acid ethyl esters (LOVAZA) 1 g capsule Take 2 capsules (2 g total) by mouth 2 (two) times daily. 06/30/19   Martinique, Peter M, MD  oxyCODONE-acetaminophen (PERCOCET/ROXICET) 5-325 MG tablet Take 1-2 tablets by mouth every 6 (six) hours as needed for severe pain. 06/30/21   Lacretia Leigh, MD  pravastatin (PRAVACHOL) 40 MG tablet Take 40 mg by mouth 2 (two) times daily.     [provider]  TRULICITY 1.5 JG/2.8ZM SOPN  06/08/19   [provider]  Vitamin D, Ergocalciferol, (DRISDOL) 1.25 MG (50000 UNIT) CAPS capsule Take 50,000 Units by mouth once a week. 04/24/19   [provider]    Physical Exam: Vitals:   07/04/21 1130 07/04/21 1230 07/04/21 1245 07/04/21 1300  BP: (!) 208/72 (!) 190/78 (!) 188/84 103/78  Pulse: 65 63 75 86  Resp: 16 20 18    Temp:      TempSrc:      SpO2: 100% 96% 98% 99%     Constitutional: Elderly female who appears to be lethargic, and not responding to verbal commands at this time after receiving Ativan. Eyes: PERRL, lids and conjunctivae normal ENMT: Mucous membranes are dry.  Posterior pharynx clear of any exudate or lesions.  Neck: Okay movement noted from left to right, but  appreciated neck stiffness with attempts at flexion Respiratory: clear to auscultation bilaterally, no wheezing, no crackles. Normal respiratory effort. No accessory muscle use.  Cardiovascular: Regular rate and rhythm, no murmurs / rubs / gallops. No extremity edema.  Abdomen: no tenderness, colostomy present of the left lower quadrant of the abdomen Musculoskeletal: no clubbing / cyanosis. No joint deformity upper and lower extremities.  Skin: no rashes, lesions, ulcers. No induration Neurologic: CN 2-12 grossly intact.  Resolved extremities to painful stimuli Psychiatric: Lethargic cannot assess orientation at this time   Data Reviewed:  EKG noted sinus rhythm at 68 bpm with minimum ST elevation in the inferior leads    Assessment and Plan: * Acute metabolic encephalopathy- (present on admission) Patient noted to be acutely confused with reports of slurred speech, difficulty following commands, and continued complaints of headache.  Initial systolic blood pressure was noted to be in the 200s, but after Ativan was noted to be improved despite patient still being altered.  MRI did not note any acute abnormality.  Patient  had been noted to have hallucinations while taking Percocet, but it was reported to have been stopped and she had been using tramadol instead without relief.   -Admit to medical telemetry bed -Neurochecks -Check UDS -Check venous blood gas -Check salicylate, acetaminophen level, LFTs, TSH, RPR -Continue IV fluids -Dr. Theda Sers of neurology consulted, recommended obtaining CT angiogram of the head and neck.  Follow-up for any further recommendations  Leukocytosis Acute.  WBC elevated 11.4 but appears to be trending down from prior.  Patient noted to be afebrile.  Urinalysis had not shown significant signs of infection on 2/23. -Check chest x-ray  -Recheck CBC tomorrow  Hypertensive urgency- (present on admission) Upon admission into the emergency department blood pressure was elevated up to 208/72. Home blood pressure regimen included lisinopril -hydrochlorothiazide 10-12.5 mg daily. -Resume home blood pressure regimen once able - Hydralazine IV prn elevated   Uncontrolled type 2 diabetes mellitus with hyperglycemia, without long-term current use of insulin ([place]) Plan admission into the emergency department patient blood sugar was noted to be 295.  Home insulin regimen includes Trulicity 1.5 mg q. weekly, metformin at 1000 mg twice daily, and Amaryl 4 mg twice daily. -Hypoglycemic protocols -Held home insulin regimen -CBGs before every meal with sensitive SSI  Hypokalemia- (present on admission) Acute.  Potassium was noted to be mildly low at 3.4.  Patient has been given 10 mEq of potassium chloride IV. -Continue to monitor and replace as needed  Hyponatremia- (present on admission) Acute.  Sodium 131.  Home medication regimen includes a diuretic which could be a possible cause.  Patient was started on IV fluids. -Continue IV fluids overnight -Recheck sodium levels in a.m.  Hypothyroidism- (present on admission) Chronic. -Check TSH -Continue levothyroxine  Elevated lipase During  last evaluation on 2/23 patient had been noted to have mildly elevated lipase of 68. -Follow-up repeat lipase  Thrombocytosis- (present on admission) Acute.  Platelet count elevated to 428.  Appears to be possibly reactive in nature. -Continue to monitor  Hypomagnesemia- (present on admission) Acute.  Initial magnesium 1.2.  Patient has been given magnesium sulfate 2 g IV. -Continue to monitor and replace as needed   Coronary artery disease due to lipid rich plaque- (present on admission) Patient has a prior history of coronary artery disease with stents placed.  There was no reports of chest pain. -Continue  aspirin, pravastatin,  Hyperlipidemia- (present on admission) -Continue pravastatin and Zetia  Rectal cancer ([place])- (present  on admission) Patient with prior history of rectal cancer status post colectomy in 2005. -Ostomy care as needed       Advance Care Planning:   Code Status: Full Code   Consults: Neurology  Family Communication: Son-in-law updated at bedside  Severity of Illness: The appropriate patient status for this patient is OBSERVATION. Observation status is judged to be reasonable and necessary in order to provide the required intensity of service to ensure the patient's safety. The patient's presenting symptoms, physical exam findings, and initial radiographic and laboratory data in the context of their medical condition is felt to place them at decreased risk for further clinical deterioration. Furthermore, it is anticipated that the patient will be medically stable for discharge from the hospital within 2 midnights of admission.   Author: Norval Morton, MD 07/04/2021 3:44 PM  For on call review [email or web address].

## 2021-07-04 NOTE — Assessment & Plan Note (Addendum)
Aspirin, pravastatin 

## 2021-07-04 NOTE — Assessment & Plan Note (Addendum)
TSH elevated, check free T4 Synthroid

## 2021-07-04 NOTE — Assessment & Plan Note (Deleted)
Acute.  Initial magnesium 1.2.  Patient has been given magnesium sulfate 2 g IV. -Continue to monitor and replace as needed

## 2021-07-04 NOTE — ED Triage Notes (Signed)
Pt seen in ED on Friday for migraine, given toradol and sent home. Yesterday daughter talked to pt and noticed slurred speech. Today pt very confused, having difficulty following commands, severe headache. Recently dx with sinus infection, started on z pack, but then was d/c after symptoms on Friday.

## 2021-07-04 NOTE — Assessment & Plan Note (Addendum)
Lisinopril-HCTZ Blood pressure improved

## 2021-07-04 NOTE — Assessment & Plan Note (Deleted)
During last evaluation on 2/23 patient had been noted to have mildly elevated lipase of 68. -Follow-up repeat lipase

## 2021-07-04 NOTE — ED Notes (Signed)
Pt thrashing around in bed, pulling off monitoring equipment and crawling out of the bed. Not agreeable to any redirection by staff. MD Haviland notified and additional meds ordered.

## 2021-07-04 NOTE — ED Provider Notes (Signed)
[facility] EMERGENCY DEPARTMENT Provider Note   CSN: [id number] Arrival date & time: 07/04/21  1114     History  Chief Complaint  Patient presents with   Altered Mental Status    Gina Villarreal is a 76 y.o. female.  Pt is a 76 yo wf with a hx of cad, hyperlipidemia, gerd, dm2, htn, hypothyroidism, rectal cancer, and melanoma.  Pt does not normally have headaches, but developed a severe headache a few days ago.  She initially thought it was sinus inflammation, so took sudafed and ibuprofen.  Sx worsened, so she went to [facility] and had a CT scan which was normal.  Labs were ok other than mild hypokalemia.  Pain got better after toradol, so she was d/c.  Pt was d/c with oxycodone which caused hallucinations.  She took an old tramadol, but that did not help.  Today, daughter went to check on her and she was very confused.  Speech slurred, and she had difficulty following commands.       Home Medications Prior to Admission medications   Medication Sig Start Date End Date Taking? Authorizing Provider  aspirin 81 MG chewable tablet Chew 1 tablet (81 mg total) by mouth daily. 02/02/15   Strader, Fransisco Hertz, PA-C  ezetimibe (ZETIA) 10 MG tablet TAKE ONE TABLET BY MOUTH DAILY 01/16/20   Martinique, Peter M, MD  glimepiride (AMARYL) 4 MG tablet Take 4 mg by mouth 2 (two) times daily.    [provider]  GLUCOSAMINE-CHONDROITIN PO Take 1 tablet by mouth daily. 1500-1200 mg    [provider]  levothyroxine (SYNTHROID) 125 MCG tablet Take 125 mcg by mouth daily. 06/08/19   [provider]  levothyroxine (SYNTHROID, LEVOTHROID) 175 MCG tablet Take 175 mcg by mouth daily. 01/05/15   [provider]  lisinopril-hydrochlorothiazide (PRINZIDE,ZESTORETIC) 10-12.5 MG tablet Take 1 tablet by mouth daily.    [provider]  metFORMIN (GLUCOPHAGE) 1000 MG tablet Take 1,000 mg by mouth 2 (two) times daily with a meal.    [provider]   nitroGLYCERIN (NITROSTAT) 0.4 MG SL tablet Place 1 tablet (0.4 mg total) under the tongue every 5 (five) minutes as needed for chest pain. 01/29/15   Skeet Latch, MD  omega-3 acid ethyl esters (LOVAZA) 1 g capsule Take 2 capsules (2 g total) by mouth 2 (two) times daily. 06/30/19   Martinique, Peter M, MD  oxyCODONE-acetaminophen (PERCOCET/ROXICET) 5-325 MG tablet Take 1-2 tablets by mouth every 6 (six) hours as needed for severe pain. 06/30/21   Lacretia Leigh, MD  pravastatin (PRAVACHOL) 40 MG tablet Take 40 mg by mouth 2 (two) times daily.    [provider]  TRULICITY 1.5 YK/5.9DJ SOPN  06/08/19   [provider]  Vitamin D, Ergocalciferol, (DRISDOL) 1.25 MG (50000 UNIT) CAPS capsule Take 50,000 Units by mouth once a week. 04/24/19   [provider]      Allergies    Metoprolol and Morphine and related    Review of Systems   Review of Systems  Neurological:  Positive for headaches.  All other systems reviewed and are negative.  Physical Exam Updated Vital Signs BP 103/78    Pulse 86    Temp 97.6 F (36.4 C) (Axillary)    Resp 18    SpO2 99%  Physical Exam Vitals and nursing note reviewed.  Constitutional:      Appearance: Normal appearance.  HENT:     Head: Normocephalic and atraumatic.  Right Ear: External ear normal.     Left Ear: External ear normal.     Nose: Nose normal.     Mouth/Throat:     Mouth: Mucous membranes are dry.     Pharynx: Oropharynx is clear.  Eyes:     Extraocular Movements: Extraocular movements intact.     Conjunctiva/sclera: Conjunctivae normal.     Pupils: Pupils are equal, round, and reactive to light.  Cardiovascular:     Rate and Rhythm: Normal rate and regular rhythm.     Pulses: Normal pulses.     Heart sounds: Normal heart sounds.  Pulmonary:     Effort: Pulmonary effort is normal.     Breath sounds: Normal breath sounds.  Abdominal:     General: Abdomen is flat. Bowel sounds are normal.     Palpations:  Abdomen is soft.  Musculoskeletal:        General: Normal range of motion.     Cervical back: Normal range of motion and neck supple.  Skin:    General: Skin is warm.     Capillary Refill: Capillary refill takes less than 2 seconds.  Neurological:     General: No focal deficit present.     Mental Status: She is alert and oriented to person, place, and time.  Psychiatric:        Mood and Affect: Mood normal.        Behavior: Behavior is agitated.    ED Results / Procedures / Treatments   Labs (all labs ordered are listed, but only abnormal results are displayed) Labs Reviewed  BASIC METABOLIC PANEL - Abnormal; Notable for the following components:      Result Value   Sodium 131 (*)    Potassium 3.4 (*)    Chloride 89 (*)    Glucose, Bld 295 (*)    Anion gap 16 (*)    All other components within normal limits  CBC WITH DIFFERENTIAL/PLATELET - Abnormal; Notable for the following components:   WBC 11.4 (*)    Platelets 428 (*)    Neutro Abs 9.4 (*)    All other components within normal limits  MAGNESIUM - Abnormal; Notable for the following components:   Magnesium 1.2 (*)    All other components within normal limits  RESP PANEL BY RT-PCR (FLU A&B, COVID) ARPGX2  URINALYSIS, ROUTINE W REFLEX MICROSCOPIC  LIPASE, BLOOD  HEPATIC FUNCTION PANEL  SALICYLATE LEVEL  ACETAMINOPHEN LEVEL  RAPID URINE DRUG SCREEN, HOSP PERFORMED  BLOOD GAS, VENOUS  RPR    EKG EKG Interpretation  Date/Time:  Monday July 04 2021 11:28:13 EST Ventricular Rate:  68 PR Interval:  159 QRS Duration: 103 QT Interval:  424 QTC Calculation: 451 R Axis:   14 Text Interpretation: Sinus rhythm Minimal ST elevation, inferior leads No significant change since last tracing Confirmed by Isla Pence [phone]) on 07/04/2021 1:22:07 PM  Radiology MR BRAIN WO CONTRAST  Result Date: 07/04/2021 CLINICAL DATA:  Neuro deficit, stroke suspected EXAM: MRI HEAD WITHOUT CONTRAST TECHNIQUE: Multiplanar,  multiecho pulse sequences of the brain and surrounding structures were obtained without intravenous contrast. COMPARISON:  Noncontrast CT head 06/30/2021 FINDINGS: Brain: There is no evidence of acute intracranial hemorrhage, extra-axial fluid collection, or acute infarct. Background parenchymal volume is normal. The ventricles are normal in size. There is a minimal burden of white matter microangiopathic change. There is no suspicious parenchymal signal abnormality. There is no mass lesion. There is no midline shift. 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 Vascular: Normal flow voids. Skull and upper cervical spine: Normal marrow signal. Sinuses/Orbits: The paranasal sinuses are clear. The globes and orbits are unremarkable. Other: None. IMPRESSION: No acute intracranial pathology. Electronically Signed   By: Valetta Mole M.D.   On: 07/04/2021 14:57    Procedures Procedures    Medications Ordered in ED Medications  sodium chloride 0.9 % bolus 1,000 mL (0 mLs Intravenous Stopped 07/04/21 1412)    And  0.9 %  sodium chloride infusion (0 mLs Intravenous Paused 07/04/21 1412)  sodium chloride 0.9 % bolus 1,000 mL (has no administration in time range)  potassium chloride 10 mEq in 100 mL IVPB (has no administration in time range)  insulin aspart (novoLOG) injection 4 Units (has no administration in time range)  LORazepam (ATIVAN) injection 1 mg (has no administration in time range)  metoCLOPramide (REGLAN) injection 10 mg (10 mg Intravenous Given 07/04/21 1142)  diphenhydrAMINE (BENADRYL) injection 12.5 mg (12.5 mg Intravenous Given 07/04/21 1140)  ketorolac (TORADOL) 15 MG/ML injection 15 mg (15 mg Intravenous Given 07/04/21 1141)  LORazepam (ATIVAN) injection 1 mg (1 mg Intravenous Given 07/04/21 1223)  fentaNYL (SUBLIMAZE) injection 50 mcg (50 mcg Intravenous Given 07/04/21 1226)  LORazepam (ATIVAN) injection 1 mg (1 mg Intravenous Given 07/04/21 1254)  magnesium sulfate IVPB 2 g 50 mL (0 g  Intravenous Paused 07/04/21 1412)  LORazepam (ATIVAN) injection 1 mg (1 mg Intravenous Given 07/04/21 1402)    ED Course/ Medical Decision Making/ A&P                           Medical Decision Making Amount and/or Complexity of Data Reviewed Labs: ordered. Radiology: ordered.  Risk Prescription drug management. Decision regarding hospitalization.   This patient presents to the ED for concern of headache and AMS, this involves an extensive number of treatment options, and is a complaint that carries with it a high risk of complications and morbidity.  The differential diagnosis includes cva, metabolic enceph, infection   Co morbidities that complicate the patient evaluation  cad, hyperlipidemia, gerd, dm2, htn, hypothyroidism, rectal cancer, and melanoma.   Additional history obtained:  Additional history obtained from epic chart review External records from outside source obtained and reviewed including daughter   Lab Tests:  I Ordered, and personally interpreted labs.  The pertinent results include:  hypokalemia, hypomagnesemia, hyperglycemia, covid/flu neg   Imaging Studies ordered:  I ordered imaging studies including MRI  I independently visualized and interpreted imaging which showed    IMPRESSION:  No acute intracranial pathology.   I agree with the radiologist interpretation   Cardiac Monitoring:  The patient was maintained on a cardiac monitor.  I personally viewed and interpreted the cardiac monitored which showed an underlying rhythm of: nsr   Medicines ordered and prescription drug management:  I ordered medication including IVFs, toradol, reglan, benadryl, ativan, fentanyl  for h/a  Reevaluation of the patient after these medicines showed that the patient improved I have reviewed the patients home medicines and have made adjustments as needed   Consultations Obtained:  I requested consultation with the hospitalist, Dr. Tamala Julian,  and discussed lab  and imaging findings as well as pertinent plan - they recommend: admission for AMS.   Problem List / ED Course:  Intractable headache:  MRI neg. AMS:  ? From complex migraine, pain meds Hypokalemia/hypomagnesemia:  replaced   Reevaluation:  After the interventions noted above, I reevaluated the patient and found that  they have :improved   Social Determinants of Health:  Lives at home with husband who has dementia.  She is the main care giver.  Daughter helpful.   Dispostion:  After consideration of the diagnostic results and the patients response to treatment, I feel that the patent would benefit from admission..          Final Clinical Impression(s) / ED Diagnoses Final diagnoses:  Metabolic encephalopathy  Dehydration  Hypomagnesemia  Acute nonintractable headache, unspecified headache type    Rx / DC Orders ED Discharge Orders     None         Isla Pence, MD 07/04/21 1555

## 2021-07-04 NOTE — Assessment & Plan Note (Addendum)
No acute sign of infectious process, could be reactive, trend

## 2021-07-04 NOTE — Assessment & Plan Note (Addendum)
Replace, trend

## 2021-07-04 NOTE — Assessment & Plan Note (Deleted)
Acute.  Platelet count elevated to 428.  Appears to be possibly reactive in nature. -Continue to monitor

## 2021-07-05 ENCOUNTER — Inpatient Hospital Stay (HOSPITAL_COMMUNITY): Payer: Medicare Other

## 2021-07-05 DIAGNOSIS — G9341 Metabolic encephalopathy: Secondary | ICD-10-CM | POA: Diagnosis not present

## 2021-07-05 DIAGNOSIS — Z20822 Contact with and (suspected) exposure to covid-19: Secondary | ICD-10-CM | POA: Diagnosis present

## 2021-07-05 DIAGNOSIS — Z85048 Personal history of other malignant neoplasm of rectum, rectosigmoid junction, and anus: Secondary | ICD-10-CM | POA: Diagnosis not present

## 2021-07-05 DIAGNOSIS — E7800 Pure hypercholesterolemia, unspecified: Secondary | ICD-10-CM | POA: Diagnosis not present

## 2021-07-05 DIAGNOSIS — I6340 Cerebral infarction due to embolism of unspecified cerebral artery: Secondary | ICD-10-CM | POA: Diagnosis not present

## 2021-07-05 DIAGNOSIS — D72829 Elevated white blood cell count, unspecified: Secondary | ICD-10-CM | POA: Diagnosis present

## 2021-07-05 DIAGNOSIS — I639 Cerebral infarction, unspecified: Secondary | ICD-10-CM | POA: Diagnosis not present

## 2021-07-05 DIAGNOSIS — D75839 Thrombocytosis, unspecified: Secondary | ICD-10-CM | POA: Diagnosis present

## 2021-07-05 DIAGNOSIS — R29810 Facial weakness: Secondary | ICD-10-CM | POA: Diagnosis not present

## 2021-07-05 DIAGNOSIS — I160 Hypertensive urgency: Secondary | ICD-10-CM

## 2021-07-05 DIAGNOSIS — E782 Mixed hyperlipidemia: Secondary | ICD-10-CM | POA: Diagnosis present

## 2021-07-05 DIAGNOSIS — E039 Hypothyroidism, unspecified: Secondary | ICD-10-CM | POA: Diagnosis present

## 2021-07-05 DIAGNOSIS — R29704 NIHSS score 4: Secondary | ICD-10-CM | POA: Diagnosis not present

## 2021-07-05 DIAGNOSIS — E669 Obesity, unspecified: Secondary | ICD-10-CM | POA: Diagnosis present

## 2021-07-05 DIAGNOSIS — R519 Headache, unspecified: Secondary | ICD-10-CM

## 2021-07-05 DIAGNOSIS — G928 Other toxic encephalopathy: Secondary | ICD-10-CM | POA: Diagnosis present

## 2021-07-05 DIAGNOSIS — E1165 Type 2 diabetes mellitus with hyperglycemia: Secondary | ICD-10-CM | POA: Diagnosis present

## 2021-07-05 DIAGNOSIS — R29818 Other symptoms and signs involving the nervous system: Secondary | ICD-10-CM | POA: Diagnosis not present

## 2021-07-05 DIAGNOSIS — R29707 NIHSS score 7: Secondary | ICD-10-CM | POA: Diagnosis not present

## 2021-07-05 DIAGNOSIS — E860 Dehydration: Secondary | ICD-10-CM

## 2021-07-05 DIAGNOSIS — G8194 Hemiplegia, unspecified affecting left nondominant side: Secondary | ICD-10-CM | POA: Diagnosis not present

## 2021-07-05 DIAGNOSIS — G43109 Migraine with aura, not intractable, without status migrainosus: Secondary | ICD-10-CM | POA: Diagnosis present

## 2021-07-05 DIAGNOSIS — I10 Essential (primary) hypertension: Secondary | ICD-10-CM | POA: Diagnosis present

## 2021-07-05 DIAGNOSIS — E871 Hypo-osmolality and hyponatremia: Secondary | ICD-10-CM | POA: Diagnosis not present

## 2021-07-05 DIAGNOSIS — I672 Cerebral atherosclerosis: Secondary | ICD-10-CM | POA: Diagnosis not present

## 2021-07-05 DIAGNOSIS — R29706 NIHSS score 6: Secondary | ICD-10-CM | POA: Diagnosis not present

## 2021-07-05 DIAGNOSIS — E876 Hypokalemia: Secondary | ICD-10-CM | POA: Diagnosis not present

## 2021-07-05 DIAGNOSIS — I6389 Other cerebral infarction: Secondary | ICD-10-CM | POA: Diagnosis not present

## 2021-07-05 DIAGNOSIS — R4182 Altered mental status, unspecified: Secondary | ICD-10-CM | POA: Diagnosis present

## 2021-07-05 DIAGNOSIS — R29710 NIHSS score 10: Secondary | ICD-10-CM | POA: Diagnosis not present

## 2021-07-05 DIAGNOSIS — R4781 Slurred speech: Secondary | ICD-10-CM | POA: Diagnosis not present

## 2021-07-05 DIAGNOSIS — T40605A Adverse effect of unspecified narcotics, initial encounter: Secondary | ICD-10-CM | POA: Diagnosis present

## 2021-07-05 DIAGNOSIS — R29701 NIHSS score 1: Secondary | ICD-10-CM | POA: Diagnosis not present

## 2021-07-05 DIAGNOSIS — R414 Neurologic neglect syndrome: Secondary | ICD-10-CM | POA: Diagnosis not present

## 2021-07-05 LAB — BASIC METABOLIC PANEL
Anion gap: 12 (ref 5–15)
BUN: 13 mg/dL (ref 8–23)
CO2: 27 mmol/L (ref 22–32)
Calcium: 9.2 mg/dL (ref 8.9–10.3)
Chloride: 91 mmol/L — ABNORMAL LOW (ref 98–111)
Creatinine, Ser: 0.91 mg/dL (ref 0.44–1.00)
GFR, Estimated: >60 mL/min (ref >60)
Glucose, Bld: 92 mg/dL (ref 70–99)
Potassium: 3 mmol/L — ABNORMAL LOW (ref 3.5–5.1)
Sodium: 130 mmol/L — ABNORMAL LOW (ref 135–145)

## 2021-07-05 LAB — CBC
HCT: 38.3 % (ref 36.0–46.0)
Hemoglobin: 13.5 g/dL (ref 12.0–15.0)
MCH: 29.2 pg (ref 26.0–34.0)
MCHC: 35.2 g/dL (ref 30.0–36.0)
MCV: 82.9 fL (ref 80.0–100.0)
Platelets: 386 10*3/uL (ref 150–400)
RBC: 4.62 MIL/uL (ref 3.87–5.11)
RDW: 13.2 % (ref 11.5–15.5)
WBC: 13.2 10*3/uL — ABNORMAL HIGH (ref 4.0–10.5)
nRBC: 0 % (ref 0.0–0.2)

## 2021-07-05 LAB — TSH: TSH: 8.872 u[IU]/mL — ABNORMAL HIGH (ref 0.350–4.500)

## 2021-07-05 LAB — I-STAT VENOUS BLOOD GAS, ED
Acid-Base Excess: 5 mmol/L — ABNORMAL HIGH (ref 0.0–2.0)
Bicarbonate: 28.4 mmol/L — ABNORMAL HIGH (ref 20.0–28.0)
Calcium, Ion: 1.11 mmol/L — ABNORMAL LOW (ref 1.15–1.40)
HCT: 39 % (ref 36.0–46.0)
Hemoglobin: 13.3 g/dL (ref 12.0–15.0)
O2 Saturation: 93 %
Potassium: 3.1 mmol/L — ABNORMAL LOW (ref 3.5–5.1)
Sodium: 130 mmol/L — ABNORMAL LOW (ref 135–145)
TCO2: 30 mmol/L (ref 22–32)
pCO2, Ven: 38.6 mmHg — ABNORMAL LOW (ref 44–60)
pH, Ven: 7.475 — ABNORMAL HIGH (ref 7.25–7.43)
pO2, Ven: 63 mmHg — ABNORMAL HIGH (ref 32–45)

## 2021-07-05 LAB — HEPATIC FUNCTION PANEL
ALT: 17 U/L (ref 0–44)
AST: 18 U/L (ref 15–41)
Albumin: 3.7 g/dL (ref 3.5–5.0)
Alkaline Phosphatase: 57 U/L (ref 38–126)
Bilirubin, Direct: 0.2 mg/dL (ref 0.0–0.2)
Indirect Bilirubin: 0.9 mg/dL (ref 0.3–0.9)
Total Bilirubin: 1.1 mg/dL (ref 0.3–1.2)
Total Protein: 6.5 g/dL (ref 6.5–8.1)

## 2021-07-05 LAB — GLUCOSE, CAPILLARY
Glucose-Capillary: 113 mg/dL — ABNORMAL HIGH (ref 70–99)
Glucose-Capillary: 119 mg/dL — ABNORMAL HIGH (ref 70–99)
Glucose-Capillary: 139 mg/dL — ABNORMAL HIGH (ref 70–99)
Glucose-Capillary: 175 mg/dL — ABNORMAL HIGH (ref 70–99)
Glucose-Capillary: 92 mg/dL (ref 70–99)

## 2021-07-05 LAB — LIPASE, BLOOD: Lipase: 37 U/L (ref 11–51)

## 2021-07-05 LAB — MAGNESIUM: Magnesium: 1.9 mg/dL (ref 1.7–2.4)

## 2021-07-05 LAB — RAPID URINE DRUG SCREEN, HOSP PERFORMED
Amphetamines: NOT DETECTED
Barbiturates: NOT DETECTED
Benzodiazepines: POSITIVE — AB
Cocaine: NOT DETECTED
Opiates: NOT DETECTED
Tetrahydrocannabinol: NOT DETECTED

## 2021-07-05 LAB — HIV ANTIBODY (ROUTINE TESTING W REFLEX): HIV Screen 4th Generation wRfx: NONREACTIVE

## 2021-07-05 LAB — TROPONIN I (HIGH SENSITIVITY): Troponin I (High Sensitivity): 10 ng/L (ref <18)

## 2021-07-05 LAB — T4, FREE: Free T4: 1.31 ng/dL — ABNORMAL HIGH (ref 0.61–1.12)

## 2021-07-05 LAB — HEMOGLOBIN A1C
Hgb A1c MFr Bld: 7 % — ABNORMAL HIGH (ref 4.8–5.6)
Mean Plasma Glucose: 154.2 mg/dL

## 2021-07-05 LAB — CBG MONITORING, ED
Glucose-Capillary: 92 mg/dL (ref 70–99)
Glucose-Capillary: 176 mg/dL — ABNORMAL HIGH (ref 70–99)

## 2021-07-05 LAB — SALICYLATE LEVEL: Salicylate Lvl: <7 mg/dL — ABNORMAL LOW (ref 7.0–30.0)

## 2021-07-05 LAB — ACETAMINOPHEN LEVEL: Acetaminophen (Tylenol), Serum: <10 ug/mL — ABNORMAL LOW (ref 10–30)

## 2021-07-05 IMAGING — CT CT HEAD CODE STROKE
3 of 4 series · 15 of 47 positions shown, 18 images · non-contrast
Comparison: CT head [DATE].  MRI head [DATE]

CLINICAL DATA: Code stroke. Acute neuro deficit. Slurred speech
facial droop



[Series 2: head 5.0 st · axial · 0.40mm/px · z∈[-128,+12]mm · 9 of 34 slices shown, 12 images]
[im 3/34  brain]
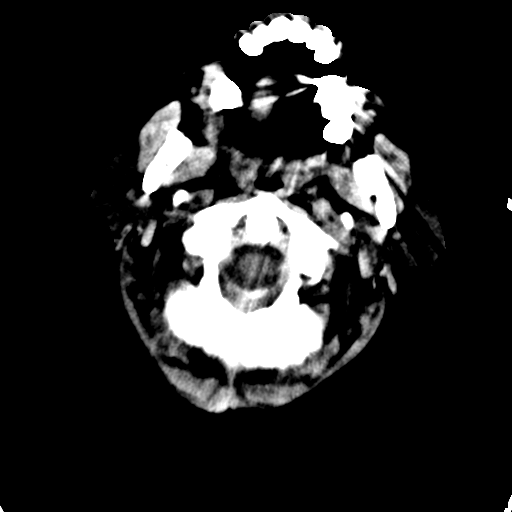
[im 3/34  bone]
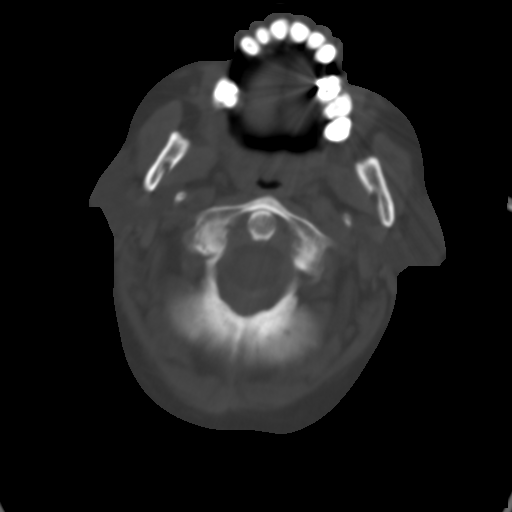
[im 8/34  brain]
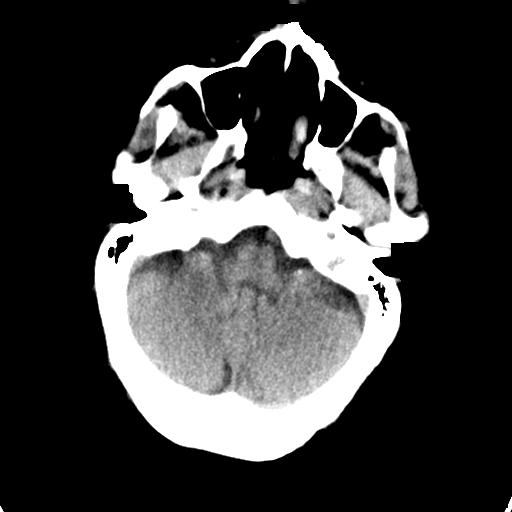
[im 10/34  brain]
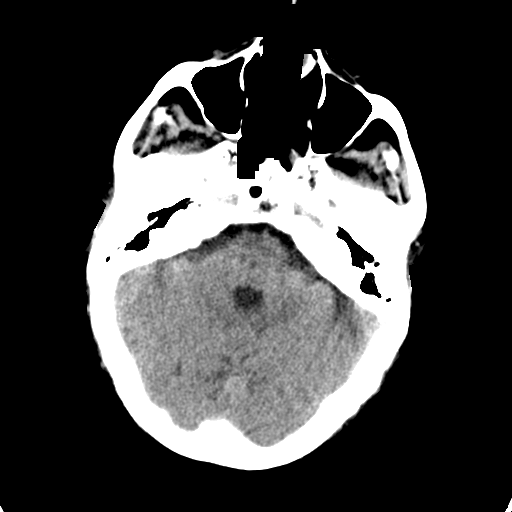
[im 15/34  brain]
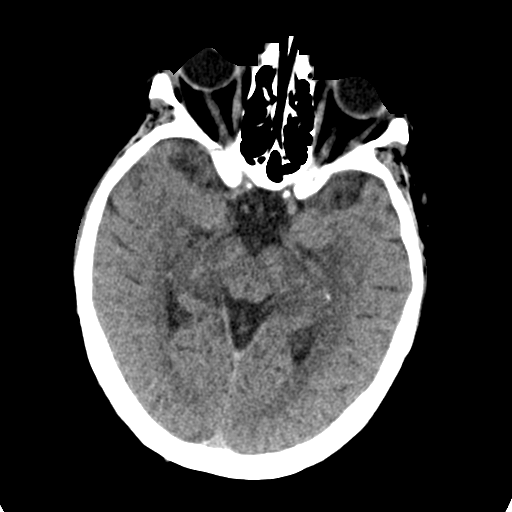
[im 17/34  brain]
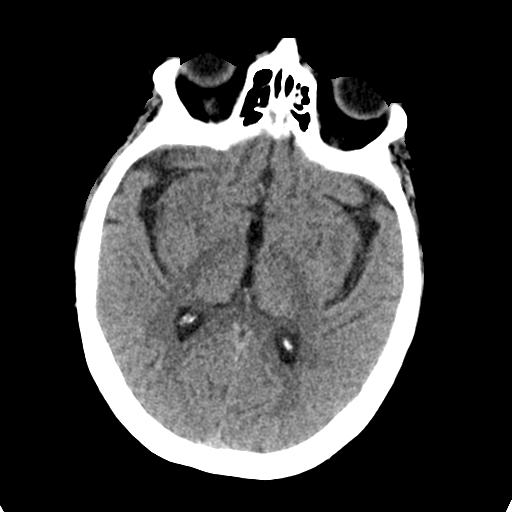
[im 17/34  bone]
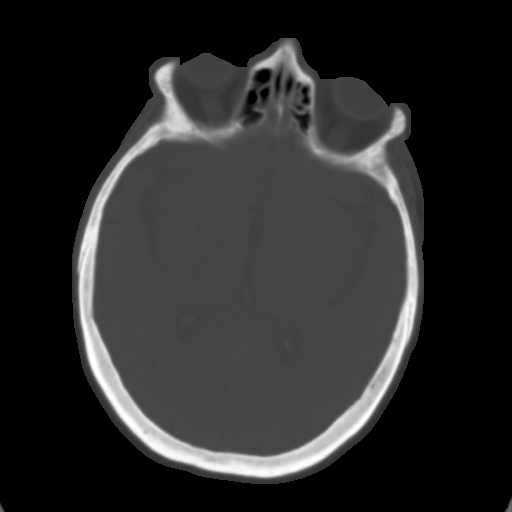
[im 19/34  brain]
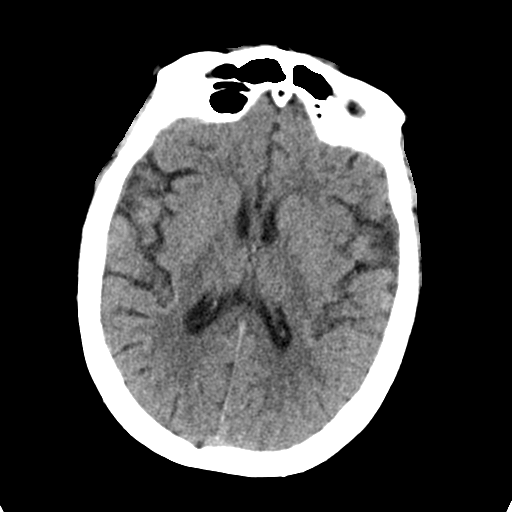
[im 24/34  brain]
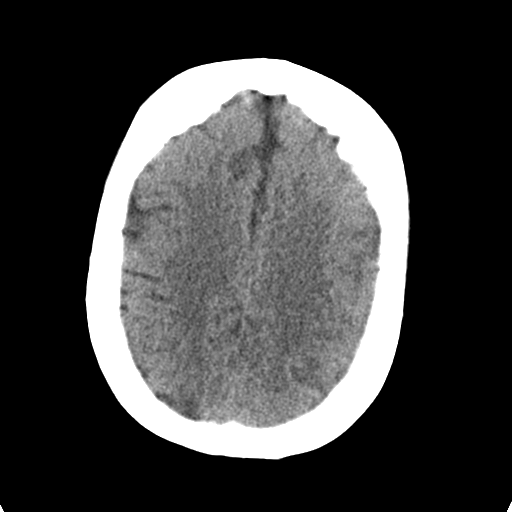
[im 26/34  brain]
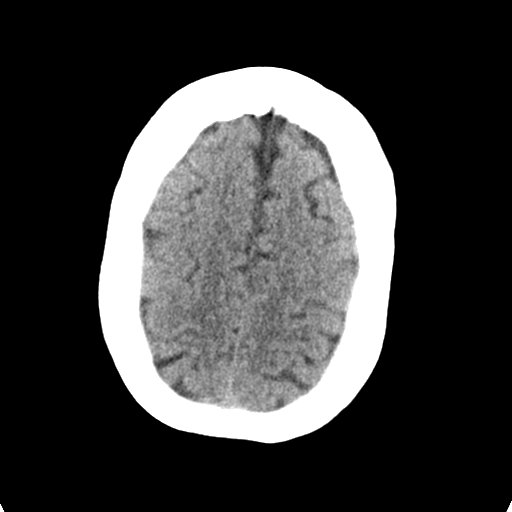
[im 31/34  brain]
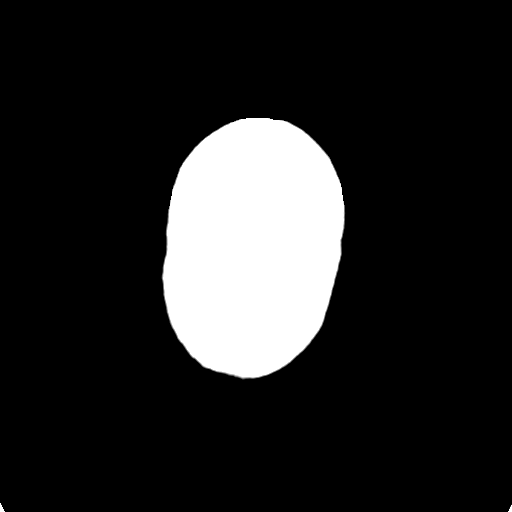
[im 31/34  bone]
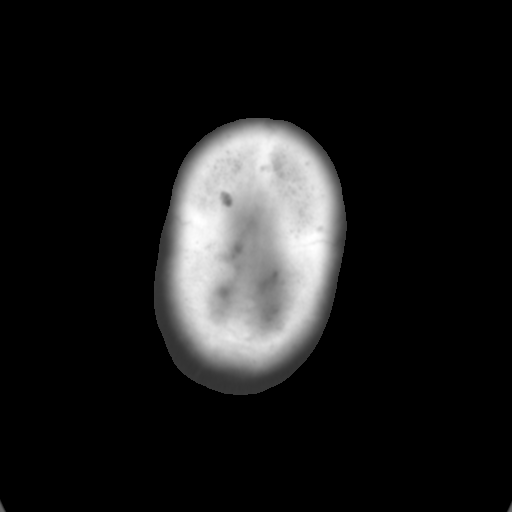

[Series 6: head 3.0 cor st · coronal · 0.31mm/px · 3 of 69 slices shown]
[im 23/69  brain]
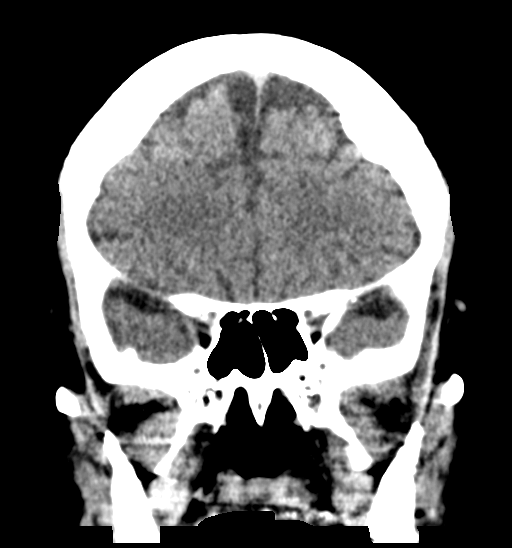
[im 31/69  brain]
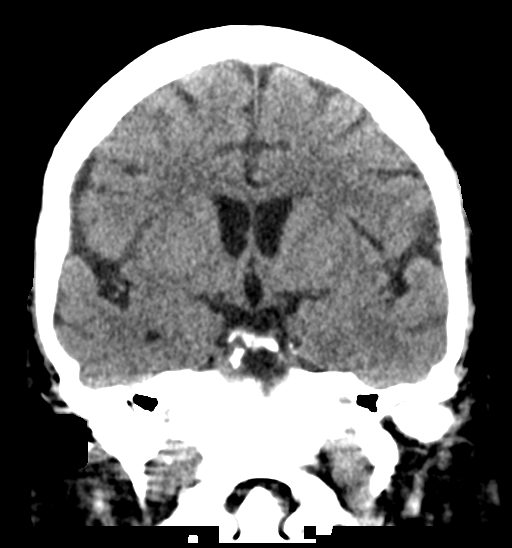
[im 38/69  brain]
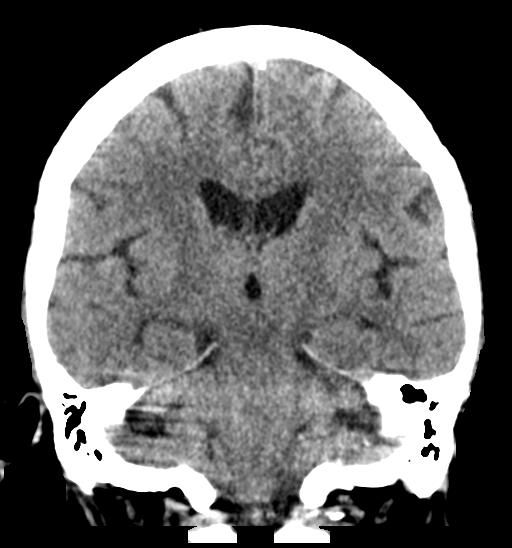

[Series 7: head 3.0 sag st · sagittal · 0.33mm/px · 3 of 56 slices shown]
[im 19/56  brain]
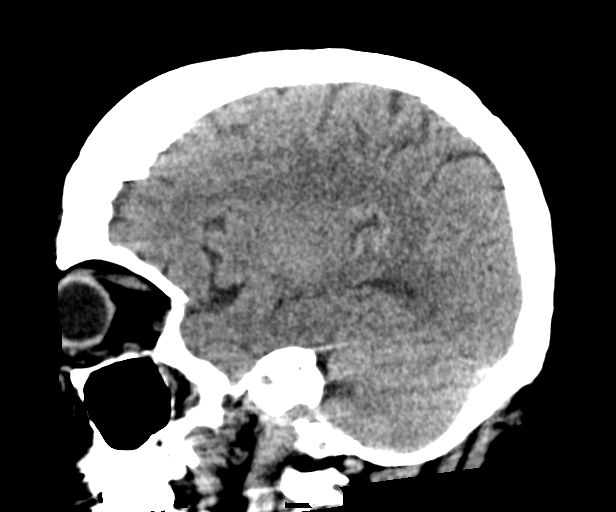
[im 28/56  brain]
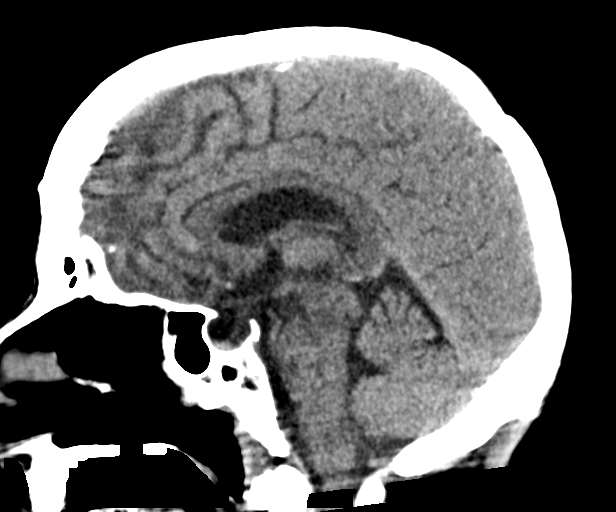
[im 37/56  brain]
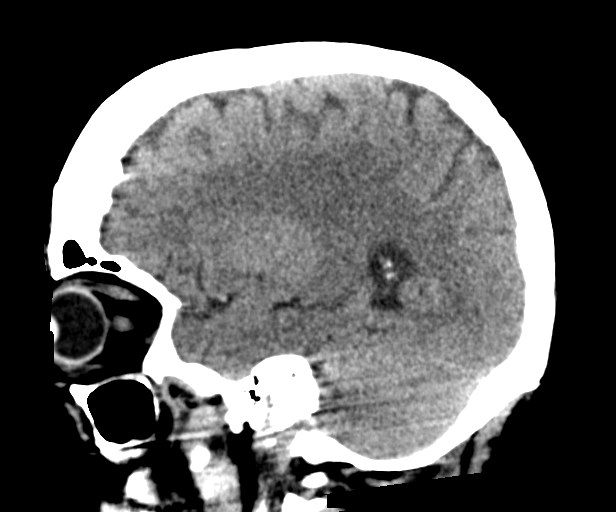

[15 of 47 positions shown; findings below may reference images not displayed]

FINDINGS: Brain: Ventricle size and cerebral volume normal for age. Negative
for acute hemorrhage or mass.

Review of the MRI yesterday reveals a small focus of restricted
diffusion in the left middle frontal gyrus likely due to a small
acute infarct approximally 3 mm. This is not identified by CT.

Vascular: Negative for hyperdense vessel

Skull: Negative

Sinuses/Orbits: Paranasal sinuses clear.  Negative orbit

Other: None

ASPECTS (Alberta Stroke Program Early CT Score)

- Ganglionic level infarction (caudate, lentiform nuclei, internal
capsule, insula, M1-M3 cortex): 7

- Supraganglionic infarction (M4-M6 cortex): 3

Total score (0-10 with 10 being normal): 10
IMPRESSION: 1. CT head negative for acute abnormality.
2. ASPECTS is 10
3. Review of MRI yesterday reveals a small 3 mm focus of restricted
diffusion left frontal cortex compatible with acute infarct.
4. These results were called by telephone at the time of
interpretation on [DATE] at [DATE] to provider ZEINAB
, who verbally acknowledged these results.

## 2021-07-05 IMAGING — CT CT ANGIO HEAD-NECK (W OR W/O PERF)
2 of 7 series · 8 of 33 positions shown · IV contrast (OMNI 350)
Comparison: CT angio head and neck [DATE].  CT head [DATE]

CLINICAL DATA: Stroke.  Slurred speech and facial droop.

EXAM:
CT ANGIOGRAPHY HEAD AND NECK
TECHNIQUE: Multidetector CT imaging of the head and neck was performed using
the standard protocol during bolus administration of intravenous
contrast. Multiplanar CT image reconstructions and MIPs were
obtained to evaluate the vascular anatomy. Carotid stenosis
measurements (when applicable) are obtained utilizing NASCET
criteria, using the distal internal carotid diameter as the
denominator.

[Series 5: cta neck (person_name) · axial · 0.51mm/px · z∈[-195,-87]mm · 2 of 164 slices shown]
[im 55/164  soft-tissue]
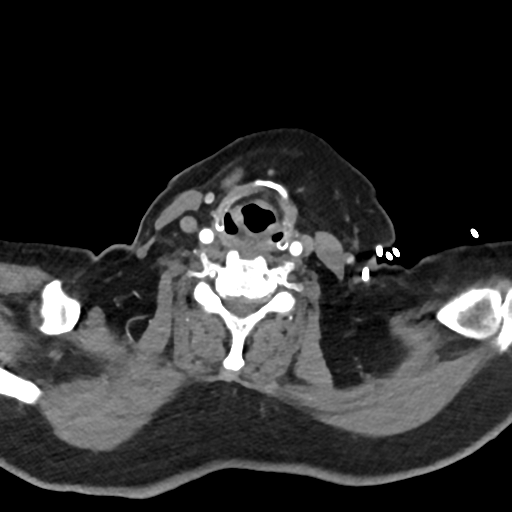
[im 109/164  soft-tissue]
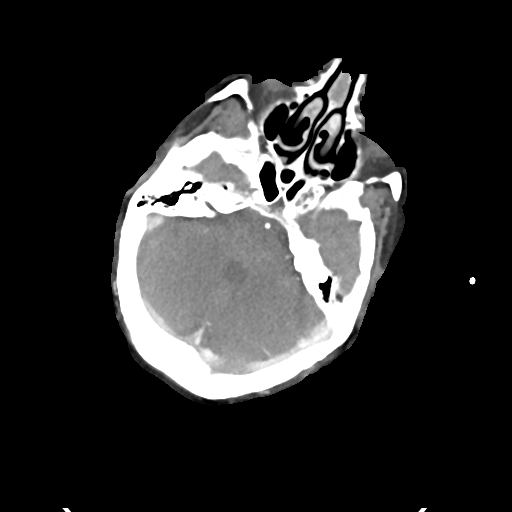

[Series 7: cta neck axial · axial · 0.39mm/px · z∈[-256,-22]mm · 6 of 328 slices shown]
[im 47/328  soft-tissue]
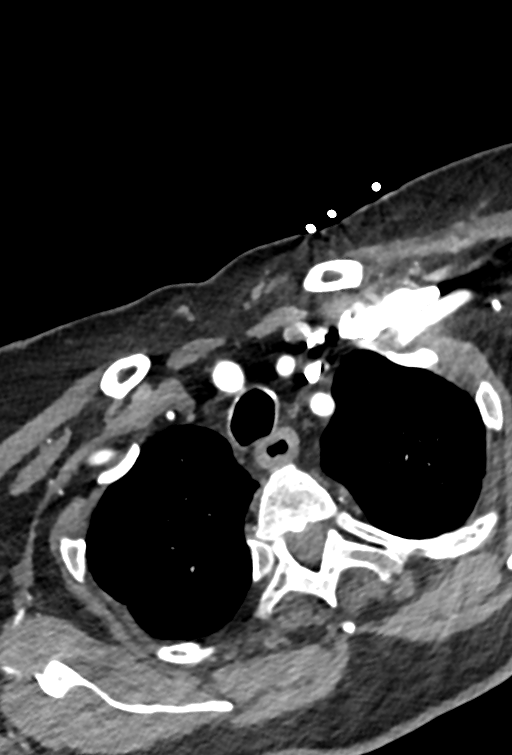
[im 94/328  bone]
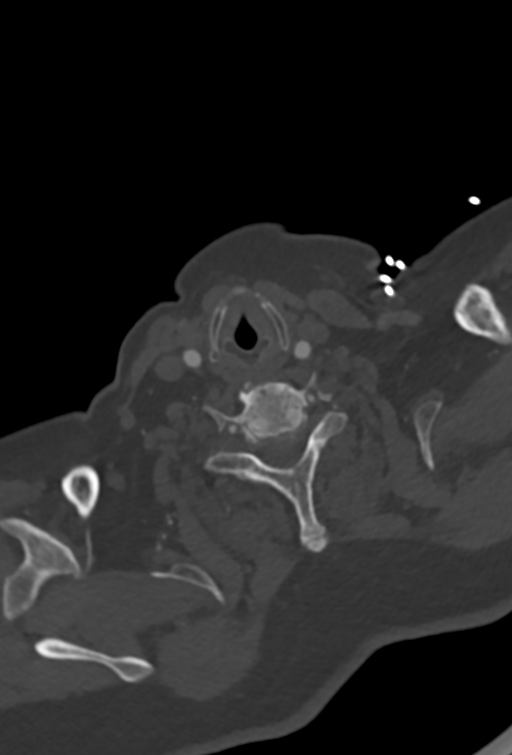
[im 141/328  soft-tissue]
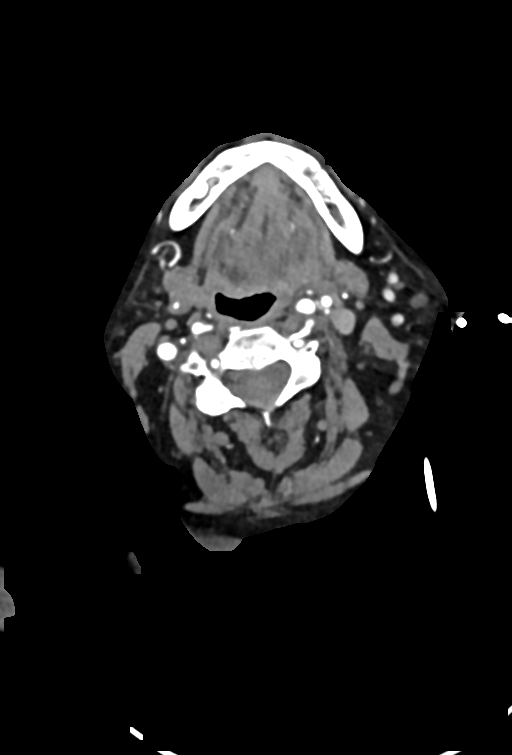
[im 187/328  bone]
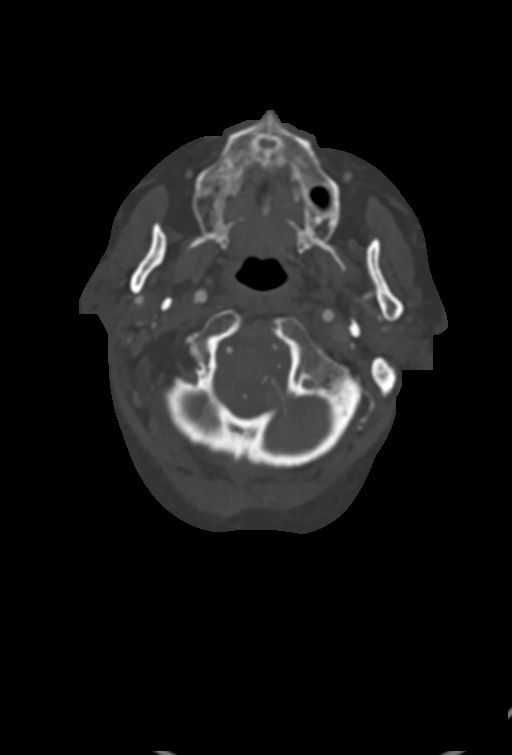
[im 234/328  soft-tissue]
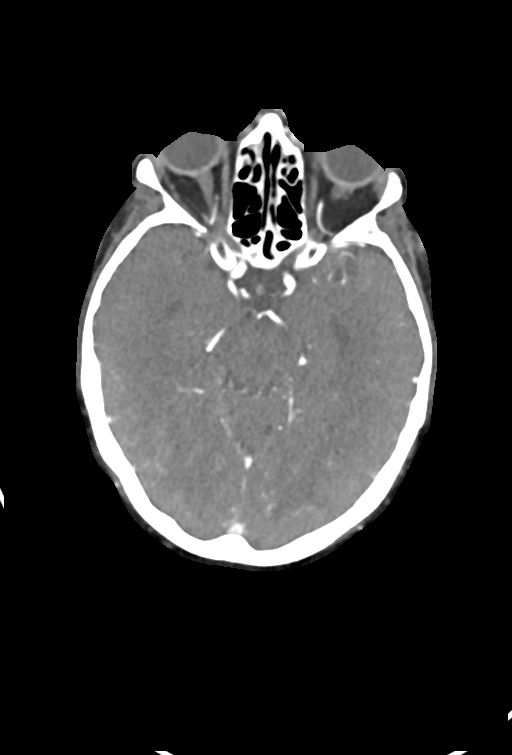
[im 281/328  bone]
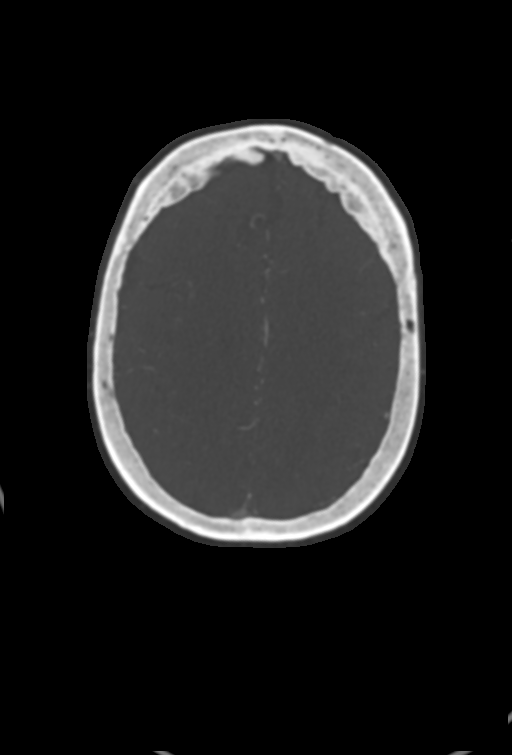

[8 of 33 positions shown; findings below may reference images not displayed]

RADIATION DOSE REDUCTION: This exam was performed according to the
departmental dose-optimization program which includes automated
exposure control, adjustment of the mA and/or kV according to
patient size and/or use of iterative reconstruction technique.

CONTRAST:  75mL OMNIPAQUE IOHEXOL 350 MG/ML SOLN
FINDINGS: CTA NECK FINDINGS

Aortic arch: Standard branching. Imaged portion shows no evidence of
aneurysm or dissection. No significant stenosis of the major arch
vessel origins. Mild atherosclerotic aorta.

Right carotid system: Mild atherosclerotic disease right carotid
bifurcation. Negative for right carotid stenosis or dissection.

Left carotid system: Mild atherosclerotic calcification left carotid
bifurcation. Negative for stenosis or dissection.

Vertebral arteries: Small vertebral arteries bilaterally without
stenosis.

Skeleton: Mild degenerative change cervical spine. No acute skeletal
abnormality.

Other neck: Negative for mass or adenopathy in the neck.

Upper chest: Lung apices clear bilaterally.

Review of the MIP images confirms the above findings

CTA HEAD FINDINGS

Anterior circulation: Internal carotid artery widely patent through
the skull base and cavernous segment. Mild atherosclerotic disease
in the cavernous carotid bilaterally.

Anterior and middle cerebral arteries widely patent without stenosis
or large vessel occlusion. Negative for aneurysm.

Posterior circulation: Both vertebral arteries patent to the
basilar. PICA patent bilaterally. Basilar patent. Superior
cerebellar arteries patent bilaterally. Fetal origin of the
posterior cerebral artery bilaterally without stenosis.

Venous sinuses: Normal venous enhancement

Anatomic variants: None

Review of the MIP images confirms the above findings
IMPRESSION: 1. No intracranial large vessel occlusion or significant stenosis
2. Mild atherosclerotic disease in the carotid bifurcation
bilaterally without stenosis. Both vertebral arteries are patent
without stenosis.

## 2021-07-05 IMAGING — MR MR HEAD W/O CM
4 of 5 series · 30 of 48 positions shown · non-contrast
Comparison: None.

CLINICAL DATA: Acute neurologic deficits

EXAM:
MRI HEAD WITHOUT CONTRAST
TECHNIQUE: Multiplanar, multiecho pulse sequences of the brain and surrounding
structures were obtained without intravenous contrast.

[Series 3: DWI · axial · 3.0mm · 0.94mm/px · z∈[-46,+97]mm · 9 of 110 slices shown (1 of 2)]
[im 7/110]
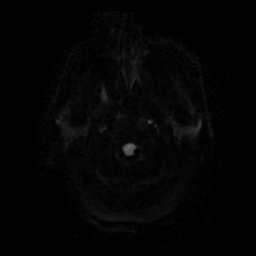
[im 19/110]
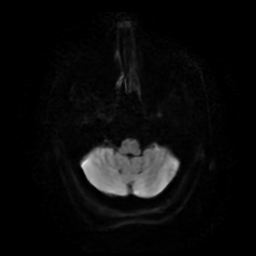
[im 31/110]
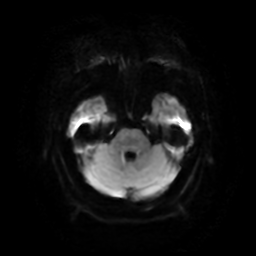
[im 49/110]
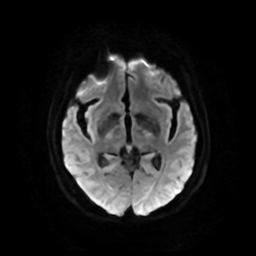
[im 55/110]
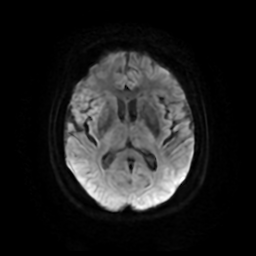
[im 61/110]
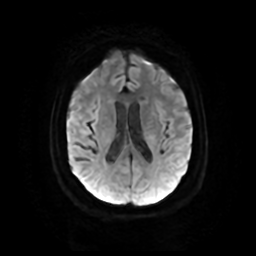
[im 79/110]
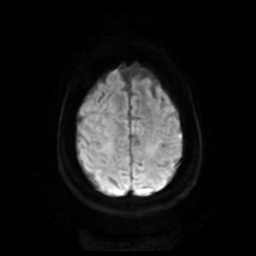
[im 91/110]
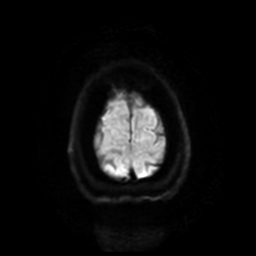
[im 103/110]
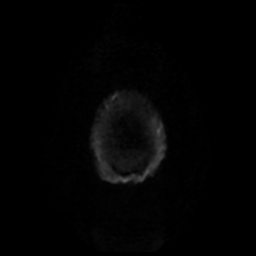

[Series 4: DWI · coronal · 4.0mm · 0.94mm/px · 9 of 74 slices shown (2 of 2)]
[im 1/74]
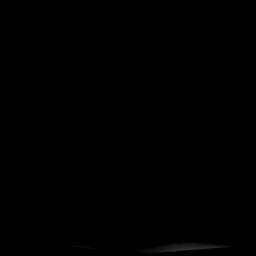
[im 14/74]
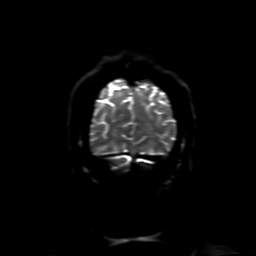
[im 20/74]
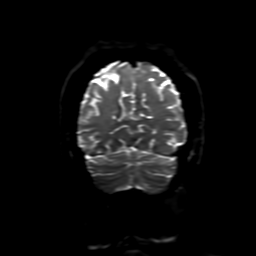
[im 34/74]
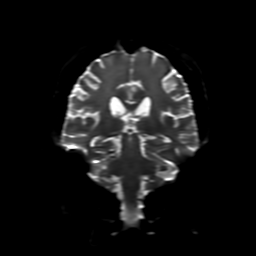
[im 40/74]
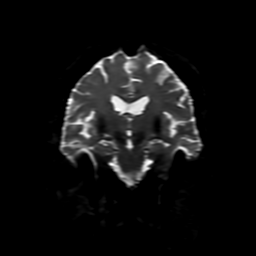
[im 54/74]
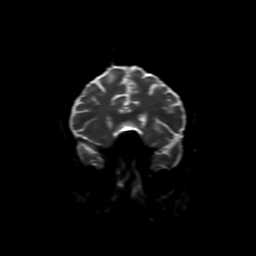
[im 60/74]
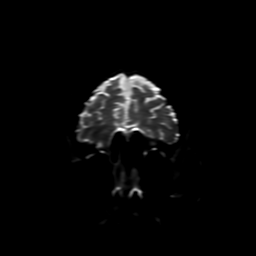
[im 67/74]
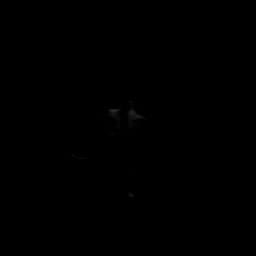
[im 74/74]
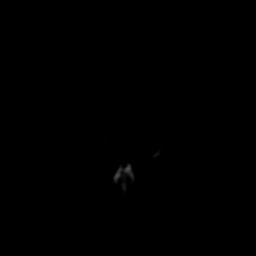

[Series 5: FLAIR · sagittal · 5.0mm · 0.23mm/px · 3 of 20 slices shown]
[im 1/20]
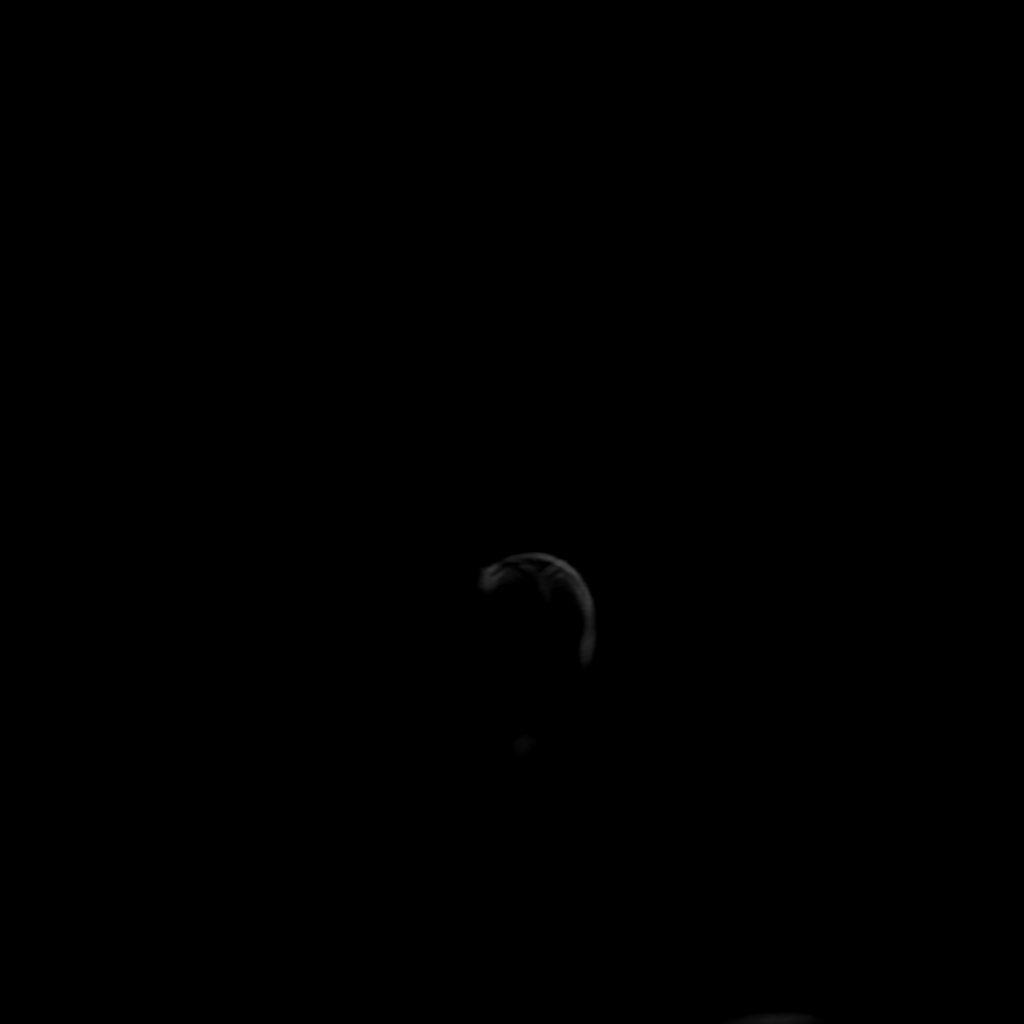
[im 10/20]
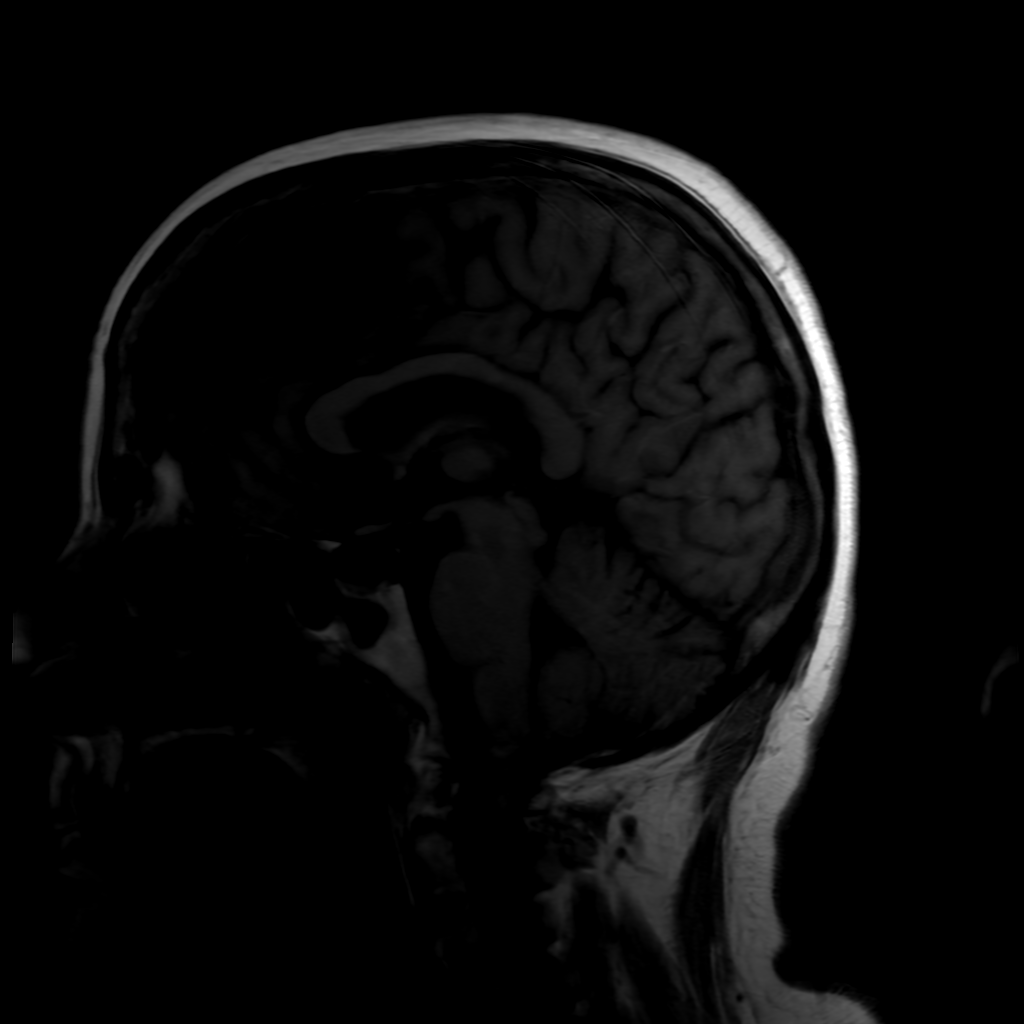
[im 20/20]
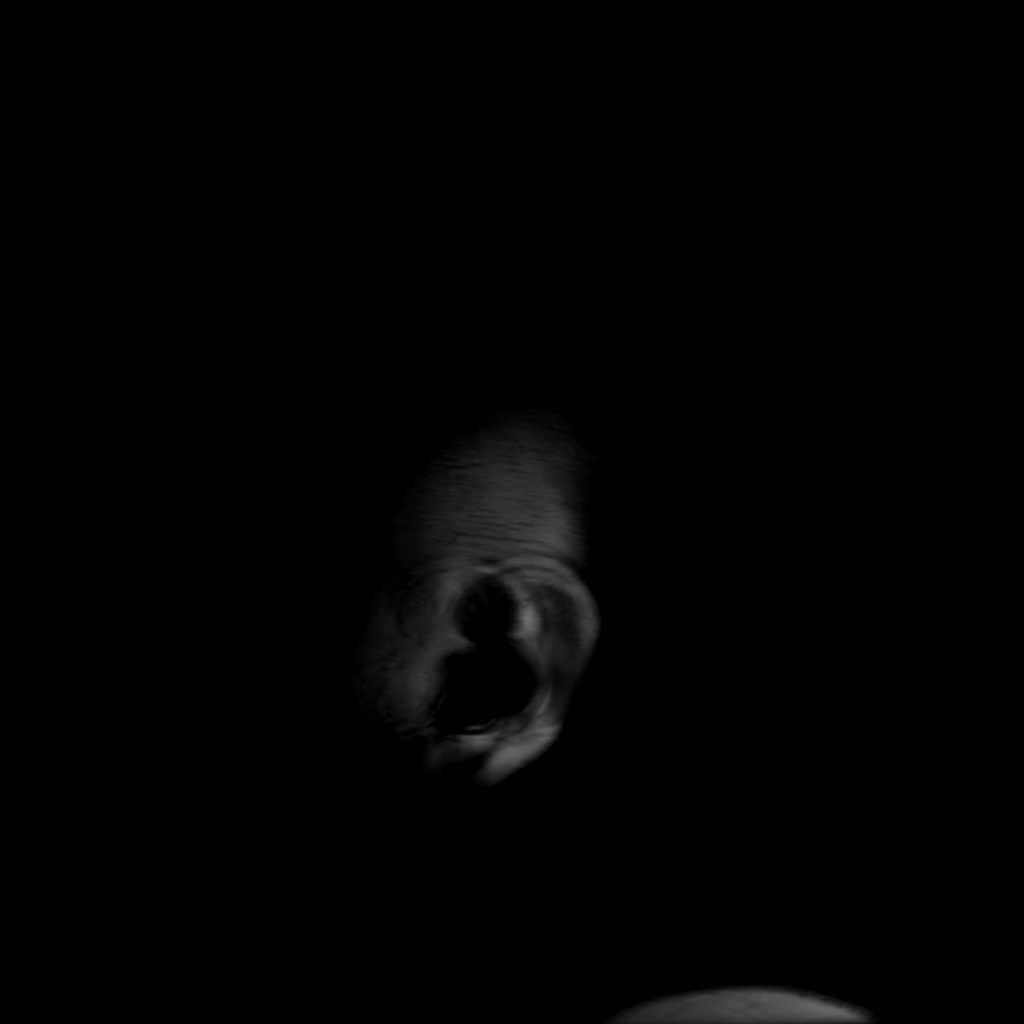

[Series 350: ADC · axial · 3.0mm · 0.94mm/px · z∈[-55,+106]mm · 9 of 55 slices shown]
[im 1/55]
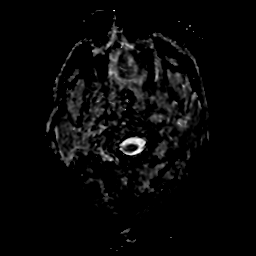
[im 7/55]
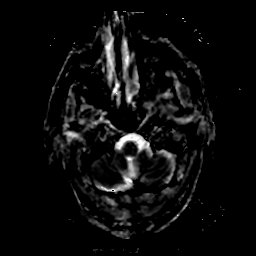
[im 14/55]
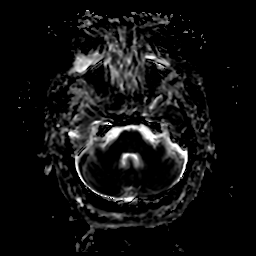
[im 21/55]
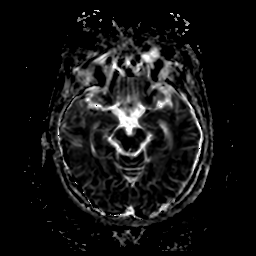
[im 28/55]
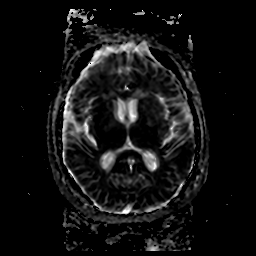
[im 34/55]
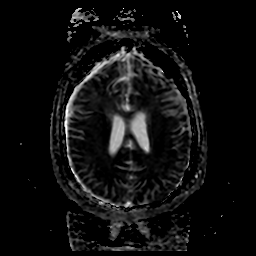
[im 41/55]
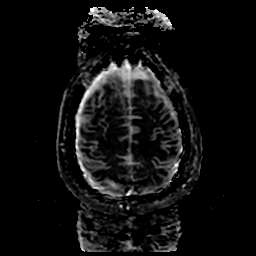
[im 48/55]
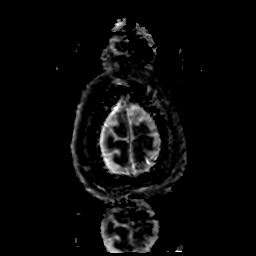
[im 55/55]
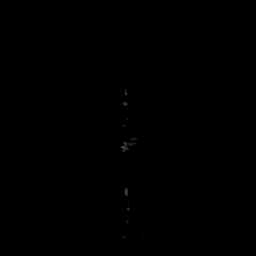

[30 of 48 positions shown; findings below may reference images not displayed]

FINDINGS: Motion degraded and truncated examination. Diffusion-weighted
imaging shows no acute ischemia. Otherwise limited brain MRI shows a
partially empty sella.
IMPRESSION: Motion degraded and truncated examination. No acute ischemia.

These results were communicated to Dr. LYNN YOUNG AE at [DATE]
on [DATE] by text page via the [employer] messaging system.

## 2021-07-05 MED ORDER — IOHEXOL 350 MG/ML SOLN
75.0000 mL | Freq: Once | INTRAVENOUS | Status: AC | PRN
  Administered 2021-07-05: 75 mL via INTRAVENOUS

## 2021-07-05 MED ORDER — HYDRALAZINE HCL 20 MG/ML IJ SOLN: 10.0000 mg | Freq: Four times a day (QID) | INTRAMUSCULAR | Status: DC | PRN

## 2021-07-05 MED ORDER — HALOPERIDOL LACTATE 5 MG/ML IJ SOLN: 2.0000 mg | Freq: Four times a day (QID) | INTRAMUSCULAR | Status: DC | PRN

## 2021-07-05 MED ORDER — LORAZEPAM 2 MG/ML IJ SOLN
INTRAMUSCULAR | Status: AC
  Filled 2021-07-05: qty 1

## 2021-07-05 MED ORDER — LORAZEPAM 2 MG/ML IJ SOLN: 1.0000 mg | Freq: Once | INTRAMUSCULAR | Status: AC

## 2021-07-05 MED ORDER — POTASSIUM CHLORIDE 10 MEQ/100ML IV SOLN
INTRAVENOUS | Status: AC
  Filled 2021-07-05: qty 100

## 2021-07-05 MED ORDER — STROKE: EARLY STAGES OF RECOVERY BOOK
Freq: Once | Status: DC
  Filled 2021-07-05 (×2): qty 1

## 2021-07-05 MED ORDER — TENECTEPLASE FOR STROKE
0.2500 mg/kg | PACK | Freq: Once | INTRAVENOUS | Status: DC
  Filled 2021-07-05: qty 10

## 2021-07-05 MED ORDER — POTASSIUM CHLORIDE CRYS ER 20 MEQ PO TBCR
40.0000 meq | EXTENDED_RELEASE_TABLET | Freq: Once | ORAL | Status: DC
Start: 2021-07-05 — End: 2021-07-05

## 2021-07-05 MED ORDER — CLOPIDOGREL BISULFATE 75 MG PO TABS
75.0000 mg | ORAL_TABLET | Freq: Every day | ORAL | Status: DC
  Administered 2021-07-05 – 2021-07-08 (×4): 75 mg via ORAL
  Filled 2021-07-05 (×4): qty 1

## 2021-07-05 MED ORDER — KETOROLAC TROMETHAMINE 30 MG/ML IJ SOLN
30.0000 mg | Freq: Three times a day (TID) | INTRAMUSCULAR | Status: DC | PRN
Start: 2021-07-05 — End: 2021-07-08
  Filled 2021-07-05: qty 1

## 2021-07-05 MED ORDER — POTASSIUM CHLORIDE IN NACL 40-0.9 MEQ/L-% IV SOLN
Freq: Once | INTRAVENOUS | Status: AC
Start: 2021-07-05 — End: 2021-07-05
  Filled 2021-07-05: qty 1000

## 2021-07-05 NOTE — Hospital Course (Signed)
Gina Villarreal is a 76 y.o. female with medical history significant of hypertension, hyperlipidemia, diabetes mellitus type 2, and rectal cancer s/p colostomy presents with complaints of headache and confusion.   Over the last couple weeks patient has been complaining of frontal headache.  Normally she is able to complete all of her ADLs without assistance and cares for her husband with mild dementia.  She had been treated by her primary care provider with a azithromycin for possible sinus infection.  She had been taking Sudafed and ibuprofen at home without improvement in symptoms.  She had been seen in the emergency department on 2/23.  CT scan of the head did not noted any acute abnormalities.  Patient seemed to improve after receiving Toradol and was discharged home with a prescription for oxycodone.  However, oxycodone reportedly made the patient hallucinate for which it was discontinued.  Thereafter patient has been taking tramadol without relief in symptoms.  However this morning when family came to check on the patient she was confused with slurred speech and some difficulty following commands.  Work-up with MRI brain and CTA head and neck were unremarkable.  Neurology was consulted.

## 2021-07-05 NOTE — Progress Notes (Signed)
Swallow eval performed on patient. Patient able to drink water and take pills whole without difficulty.

## 2021-07-05 NOTE — Progress Notes (Signed)
Progress Note   Patient: Gina Villarreal LHT:[id number] DOB: 08-Jan-1946 DOA: 07/04/2021     0 DOS: the patient was seen and examined on 07/05/2021   Brief hospital course: MELESSA COWELL is a 76 y.o. female with medical history significant of hypertension, hyperlipidemia, diabetes mellitus type 2, and rectal cancer s/p colostomy presents with complaints of headache and confusion.   Over the last couple weeks patient has been complaining of frontal headache.  Normally she is able to complete all of her ADLs without assistance and cares for her husband with mild dementia.  She had been treated by her primary care provider with a azithromycin for possible sinus infection.  She had been taking Sudafed and ibuprofen at home without improvement in symptoms.  She had been seen in the emergency department on 2/23.  CT scan of the head did not noted any acute abnormalities.  Patient seemed to improve after receiving Toradol and was discharged home with a prescription for oxycodone.  However, oxycodone reportedly made the patient hallucinate for which it was discontinued.  Thereafter patient has been taking tramadol without relief in symptoms.  However this morning when family came to check on the patient she was confused with slurred speech and some difficulty following commands.  Work-up with MRI brain and CTA head and neck were unremarkable.  Neurology was consulted.  Assessment and Plan: * Acute metabolic encephalopathy- (present on admission) Appreciate neurology MRI brain, CTA head and neck unremarkable Avoid narcotics, benzos Delirium precaution  Intractable headache Appreciate neurology recommendation:  Ketorolac 60 mg IV Prochlorperazine 10 mg IV Diphenhydramine 25 mg IV Methylprednisolone to 250 mg IV Valproic acid 500 mg IV  Hyponatremia- (present on admission) Stable, trend  Hypokalemia- (present on admission) Replace, trend  Hypothyroidism- (present on admission) TSH elevated, check  free T4 Synthroid  Leukocytosis No acute sign of infectious process, could be reactive, trend  Coronary artery disease due to lipid rich plaque- (present on admission) Aspirin, pravastatin  Hyperlipidemia- (present on admission) -Continue pravastatin and Zetia  Hypertensive urgency- (present on admission) Lisinopril-HCTZ Blood pressure improved  Uncontrolled type 2 diabetes mellitus with hyperglycemia, without long-term current use of insulin (HCC) Sliding scale insulin  Rectal cancer ([place])- (present on admission) Patient with prior history of rectal cancer status post colectomy in 2005.  Ostomy care        Subjective: Patient seen in the emergency department.  Per nursing staff, patient had had some confusion and agitation earlier but has now calmed down.  Daughter is at bedside who provides most of the history.  Daughter states that patient has complained of severe frontal headaches since the beginning of February.  This is progressed to the point of being evaluated in the hospital.  Normally, patient is very functional at home, takes care of her husband who has dementia, manages several bridge clubs.  After taking oxycodone, patient had an episode of visual hallucination.  This morning, patient's headache is much better controlled, also having slow return of her mentation back to her baseline as well.  Daughter states that patient has been under significant stress over the past couple of months with her family illnesses.  Physical Exam: Vitals:   07/04/21 2315 07/05/21 0330 07/05/21 1100 07/05/21 1200  BP: (!) 171/86 135/70  (!) 146/69  Pulse: (!) 105 90  (!) 109  Resp:  16  19  Temp:  98.8 F (37.1 C)  97.9 F (36.6 C)  TempSrc:  Oral  Oral  SpO2: 98% 99%  97%  Weight:   71.2 kg   Height:   5\' 5"  (1.651 m)    Examination: General exam: Appears calm, fatigued and lethargic Central nervous system: Alert and oriented to place and year, speech is clear   Data  Reviewed:  Potassium 3.1, LFTs unremarkable, acetaminophen and salicylate levels negative, A1c 7, TSH elevated 8.872, UDS positive for benzo  Family Communication: Daughter at bedside  Disposition: Status is: Observation The patient will require care spanning > 2 midnights and should be moved to inpatient because: Mentation not back to baseline        Planned Discharge Destination: Home      Author: Dessa Phi, DO 07/05/2021 1:12 PM  For on call review [email or web address].

## 2021-07-05 NOTE — Assessment & Plan Note (Signed)
Appreciate neurology recommendation:  Ketorolac 60 mg IV Prochlorperazine 10 mg IV Diphenhydramine 25 mg IV Methylprednisolone to 250 mg IV Valproic acid 500 mg IV

## 2021-07-05 NOTE — Consult Note (Signed)
Neurology Code [place] MR# [id number] 07/05/2021   CC: Code stroke called for 76 yo female patient after she sneezed about 3-4 times and daughter at bedside noticed left facial droop and called RN. Patient also had left sided weakness and slurred speech that was improving. When left sided neglect noted we went to CT scanner.   History is obtained from: daughter, RN and chart.  HPI: Gina Villarreal is a 76 y.o. female PMHx hypertension, hyperlipidemia, diabetes mellitus type 2, and rectal cancer s/p colostomy. She is independent at baseline running bridge groups and other community activities. Per daughter, over last couple of weeks she has been c/o frontal headache and was thought to have a sinus infection that was treated by PCP outpatient.  She continued to have headaches and then became confused and was brought to the ED and subsequently admitted to medicine service.  Today she was doing fine in the evening and talking to daughter. Around 1745, she had sudden onset sneezing for 3-4 times and was immediately weak on the left with left facial droop, left sided neglect and left vision deficit. A code stroke was activated. Deficit was improving during the exam but still had persistent L hemianopsia.  LKW: 1324 tNK given: No due to a 31mm stroke noted on 2/27 MRI after further review. IR Thrombectomy No, not a candidate due to no LVO Modified Rankin Scale: 0-Completely asymptomatic and back to baseline post- stroke  1a. Level of Consciousness 0-Alert 1b. Level of Consciousness Questions 1-Answers only one question correctly 1c. Level of Consciousness Commands 1-Performs one task correctly 2. Best Gaze 0-Normal 3. Visual 2-Complete hemianopia 4. Facial Palsy 1-Minor paralysis 5a. Motor Arm Left 0-No drift 5b. Motor Arm Right 0-No drift 6a. Motor Leg Left 0-No drift 6b. Motor Leg Right 0-No drift 7. Ataxia 0-Absent 8. Sensory 0-Normal 9. Best Language 0-No  aphasia 10. Dysarthria 0-Normal 11. Extinction and Inattention 2-Profound hemi-inattention or extinction to more than one modality  NIHSS Total: 6   ROS: A complete ROS was performed and is negative except as noted in the HPI.   Past Medical History:  Diagnosis Date   Anginal pain ([place])    Arthritis    "fingers" (02/01/2015)   Colostomy in place [facility]) since 2005   Combined hyperlipidemia    Coronary artery disease    a. 2003- BMS to RCA b. 01/2015- DES to mid LAD c. DES to mid-OM1   GERD (gastroesophageal reflux disease)    Hypertension    Hypothyroidism    Melanoma ([place])    "back; left lateral knee"   OSA on CPAP    Rectal cancer ([place]) 2005   Squamous cell carcinoma of right foot    Type II diabetes mellitus ([place])      Family History  Problem Relation Age of Onset   Heart attack Father        Pacemaker implant and valve surgery    Social History:  reports that she quit smoking about 20 years ago. Her smoking use included cigarettes. She has a 40.00 pack-year smoking history. She has never used smokeless tobacco. She reports current alcohol use. She reports that she does not use drugs.   Prior to Admission medications   Medication Sig Start Date End Date Taking? Authorizing Provider  aspirin 81 MG chewable tablet Chew 1 tablet (81 mg total) by mouth daily. 02/02/15  Yes Strader, Philo, PA-C  ezetimibe (ZETIA) 10 MG tablet TAKE ONE TABLET  BY MOUTH DAILY 01/16/20  Yes [place], Peter M, MD  glimepiride (AMARYL) 4 MG tablet Take 4 mg by mouth 2 (two) times daily.   Yes [provider]  GLUCOSAMINE-CHONDROITIN PO Take 1 tablet by mouth daily. 1500-1200 mg   Yes [provider]  levothyroxine (SYNTHROID) 112 MCG tablet Take 112 mcg by mouth daily. 04/21/21  Yes [provider]  lisinopril-hydrochlorothiazide (PRINZIDE,ZESTORETIC) 10-12.5 MG tablet Take 1 tablet by mouth daily.   Yes [provider]  metFORMIN (GLUCOPHAGE) 1000 MG tablet Take  1,000 mg by mouth 2 (two) times daily with a meal.   Yes [provider]  nitroGLYCERIN (NITROSTAT) 0.4 MG SL tablet Place 1 tablet (0.4 mg total) under the tongue every 5 (five) minutes as needed for chest pain. 01/29/15  Yes Skeet Latch, MD  pravastatin (PRAVACHOL) 40 MG tablet Take 40 mg by mouth 2 (two) times daily.   Yes [provider]  TRULICITY 1.5 ZO/1.0RU SOPN Inject 1.5 mg into the skin every Thursday. 06/08/19  Yes [provider]  Vitamin D, Ergocalciferol, (DRISDOL) 1.25 MG (50000 UNIT) CAPS capsule Take 50,000 Units by mouth once a week. 04/24/19  Yes [provider]  levothyroxine (SYNTHROID) 125 MCG tablet Take 125 mcg by mouth daily. Patient not taking: Reported on 07/04/2021 06/08/19   [provider]  levothyroxine (SYNTHROID, LEVOTHROID) 175 MCG tablet Take 175 mcg by mouth daily. Patient not taking: Reported on 07/04/2021 01/05/15   [provider]  omega-3 acid ethyl esters (LOVAZA) 1 g capsule Take 2 capsules (2 g total) by mouth 2 (two) times daily. Patient not taking: Reported on 07/04/2021 06/30/19   [place], Peter M, MD    Exam: Current vital signs: BP (!) 174/69    Pulse 84    Temp 98.2 F (36.8 C) (Oral)    Resp 16    Ht 5\' 5"  (1.651 m)    Wt 71.2 kg    SpO2 94%    BMI 26.12 kg/m    Neuro: Mental Status: Patient is awake, alert, oriented to self and daughter Patient is unable to give a clear and coherent history.Obtained by daughter Gina Villarreal at bedside. Speech mild slurring early in exam that improved  Left side arm neglect. Visual Field full on right and impaired on left Pupils are equal, round, and reactive to light. EOMI without ptosis or diploplia.  Facial movement is not symmetric with mild right sided droop that improved Hearing is intact to voice. Uvula midline and palate elevates symmetrically. Shoulder shrug is symmetric. Tongue is midline without atrophy or fasciculations.  Tone is normal. Bulk  is normal. 5/5 strength was present in all four extremities. Sensation is symmetric to light touch and temperature in the arms and legs Toes are downgoing bilaterally. FNF and HKS are intact bilaterally. Gait - Deferred  I have reviewed labs in epic and the pertinent results are: T4- 1.31 TSH- 8.872 Mag was 1.2 yesterday and after replaced was 1.9 today  I have reviewed the images obtained: -NCT head showed -CT head negative for acute abnormality. -CTA head and neck showed No intracranial large vessel occlusion or significant stenosis. Mild atherosclerotic disease in the carotid bifurcation bilaterally without stenosis. Both vertebral arteries are patent without stenosis. -Per review of MRI Brain from yesterday 2/27, Radiologist saw a small 3 mm focus of restricted diffusion left frontal cortex compatible with acute infarct.   Assessment: Gina Villarreal is a 76 y.o. female stroke risk factors including diabetes, hypertension, hyperlipidemia and  rectal cancer status post colostomy who was admitted with confusion and headache.  Confusion have resolved and she was back to her baseline and then today had an episode with left facial droop, left hemineglect and left hemianopsia.  Upon further review of the MRI from yesterday, she was noted to have a 3 mm small acute stroke and this was verified with radiology and I did initially offer tNKASE but after reviewing the images and verifying with the radiology team, TNKase was not offered.  CT angio head and neck with no LVO and thus not a candidate for thrombectomy.  Unclear if this episode truly was a stroke at this point.  We got a stat limited MRI of her brain while this episode was resolving and it was negative for a acute stroke.  Other differential includes focal seizure with post ictal todds or hemiplegic migraine. No clinical seizure activity noted at the onset of this event or during my evaluation. Another possibility is that this is a resolving  TIA.  Given her small left frontal 3 mm cortical infarct on MRI from yesterday, she will need full stroke work-up.  Of note, the noted stroke despite being small, is cortical based and not consistent with a small vessel stroke. Would be most consistent with an embolic stroke.  Plan:  - Frequent Neuro checks per stroke unit protocol - Recommend obtaining TTE - Recommend obtaining Lipid panel with LDL - Please start statin if LDL > 70 - Recommend HbA1c - Antithrombotic - Aspirin 81mg  daily along with plavix 75mg  daily for 21 days followed by Aspirin 81mg  daily alone. - Recommend DVT ppx - SBP goal - permissive hypertension first 24 h < 220/110. Held home meds.  - Recommend Telemetry monitoring for arrythmia - Recommend bedside swallow screen prior to PO intake. - Stroke education booklet - Recommend PT/OT/SLP consult - routine EEG.   This patient is critically ill and at significant risk of neurological worsening, death and care requires constant monitoring of vital signs, hemodynamics,respiratory and cardiac monitoring, neurological assessment, discussion with family, other specialists and medical decision making of high complexity. I spent 90 minutes of neurocritical care time  in the care of  this patient. This was time spent independent of any time provided by nurse practitioner or PA.  Electronically signed by:  Parke Poisson, Neuro NP  Available via Epic  07/05/2021, 6:54 PM  If 7pm- 7am, please page neurology on call as listed in [place].

## 2021-07-05 NOTE — Consult Note (Addendum)
Neurology Consult H&P  Gina Villarreal MR# [id number] 07/05/2021   CC: headache  History is obtained from: daughter and chart.  HPI: Gina Villarreal is a 76 y.o. female PMHx as reviewed below started to suffer from frontal headache which began about 2 weeks ago.  She was seen in outside hospital emergency room and was given oxycodone which the patient minimally took however due to sensitivity and hallucinations she discontinued and took tramadol instead.  Her daughter states the headache was bifrontal and reached its worst couple of days ago.  During this time she also had a headache in the back of her head nonradiating.  She was noted to have sinus infection and daughter gave her Sudafed which produced significant thick green sinus drainage.  On morning of presentation patient was found on bed somewhat confused and in extreme pain.  She does not normally suffer from headaches and does not have a history of migraine.  She was given several doses of lorazepam and is currently sleeping soundly.   ROS: A complete ROS was performed and is negative except as noted in the HPI from daughter who is at bedside. 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 2003- BMS to RCA b. 01/2015- DES to mid LAD c. DES to mid-OM1   GERD (gastroesophageal reflux disease)    Hypertension    Hypothyroidism    Melanoma ([place])    "back; left lateral knee"   OSA on CPAP    Rectal cancer ([place]) 2005   Squamous cell carcinoma of right foot    Type II diabetes mellitus ([place])     Family History  Problem Relation Age of Onset   Heart attack Father        Pacemaker implant and valve surgery   Social History:  reports that she quit smoking about 20 years ago. Her smoking use included cigarettes. She has a 40.00 pack-year smoking history. She has never used smokeless  tobacco. She reports current alcohol use. She reports that she does not use drugs.   Prior to Admission medications   Medication Sig Start Date End Date Taking? Authorizing Provider  aspirin 81 MG chewable tablet Chew 1 tablet (81 mg total) by mouth daily. 02/02/15  Yes Strader, [place] M, PA-C  ezetimibe (ZETIA) 10 MG tablet TAKE ONE TABLET BY MOUTH DAILY 01/16/20  Yes Martinique, Peter M, MD  glimepiride (AMARYL) 4 MG tablet Take 4 mg by mouth 2 (two) times daily.   Yes [provider]  GLUCOSAMINE-CHONDROITIN PO Take 1 tablet by mouth daily. 1500-1200 mg   Yes [provider]  levothyroxine (SYNTHROID) 112 MCG tablet Take 112 mcg by mouth daily. 04/21/21  Yes [provider]  lisinopril-hydrochlorothiazide (PRINZIDE,ZESTORETIC) 10-12.5 MG tablet Take 1 tablet by mouth daily.   Yes [provider]  metFORMIN (GLUCOPHAGE) 1000 MG tablet Take 1,000 mg by mouth 2 (two) times daily with a meal.   Yes [provider]  nitroGLYCERIN (NITROSTAT) 0.4 MG SL tablet Place 1 tablet (0.4 mg total) under the tongue every 5 (five) minutes as needed for chest pain. 01/29/15  Yes Skeet Latch, MD  pravastatin (PRAVACHOL) 40 MG tablet Take 40 mg by mouth 2 (two) times daily.   Yes [provider]  TRULICITY 1.5 GB/1.5VV SOPN Inject 1.5 mg into  the skin every Thursday. 06/08/19  Yes [provider]  Vitamin D, Ergocalciferol, (DRISDOL) 1.25 MG (50000 UNIT) CAPS capsule Take 50,000 Units by mouth once a week. 04/24/19  Yes [provider]  levothyroxine (SYNTHROID) 125 MCG tablet Take 125 mcg by mouth daily. Patient not taking: Reported on 07/04/2021 06/08/19   [provider]  levothyroxine (SYNTHROID, LEVOTHROID) 175 MCG tablet Take 175 mcg by mouth daily. Patient not taking: Reported on 07/04/2021 01/05/15   [provider]  omega-3 acid ethyl esters (LOVAZA) 1 g capsule Take 2 capsules (2 g total) by mouth 2 (two) times  daily. Patient not taking: Reported on 07/04/2021 06/30/19   Martinique, Peter M, MD    Exam: Current vital signs: BP (!) 171/86    Pulse (!) 105    Temp 97.6 F (36.4 C) (Axillary)    Resp 14    SpO2 98%   Physical Exam  Constitutional: Appears well-developed and well-nourished.  Psych: Sleeping soundly. Head: Normocephalic.  Cardiovascular: Normal rate and regular rhythm on the monitor Respiratory: Effort normal, symmetric excursions bilaterally, no audible wheezing. Skin: WDI  Neuro: Patient sleeping soundly and unable to examine. Gait - Deferred  I have reviewed labs in epic and the pertinent results are: Na 130 K 3.1 Mg 1.2  I have reviewed the images obtained: MRI brain read as no acute intracranial hemorrhage, extra-axial fluid collection, or acute infarct. CTA head and neck read as no emergent large vessel occlusion or hemodynamically significant stenosis of the head or neck.  Assessment: Gina Villarreal is a 76 y.o. female PMHx as noted above with 2 weeks of progressively worsening headache.  She does not normally suffer from headaches and does not have a history of migraines.  She has been under a significant amount of stress as she has a family member with metastatic bladder cancer and has not been sleeping much.  She is also reportedly encephalopathic and arrived in hypertensive urgency and found to be hyponatremic, hypomagnesemic, hyperglycemia and hypokalemic both of which may contribute to headache.  For acute headache consider: Ketorolac 60 mg IV Prochlorperazine 10 mg IV Diphenhydramine 25 mg IV Methylprednisolone to 250 mg IV Valproic acid 500 mg IV  Avoid benzodiazepines if possible.  Continue correction of blood pressure with goal <130/90. Continue gentle IV hydration. Continue to correct metabolic derangements and replete electrolytes as needed.   Neurology will remain available, please call for questions.   Electronically signed by:  Lynnae Sandhoff,  MD Page: [phone] 07/05/2021, 2:36 AM  If 7pm- 7am, please page neurology on call as listed in [place].

## 2021-07-05 NOTE — Care Management Obs Status (Signed)
[place] NOTIFICATION   Patient Details  Name: Gina Villarreal MRN: [id number] Date of Birth: [birth year]   Medicare Observation Status Notification Given:  Yes    Carles Collet, RN 07/05/2021, 2:31 PM

## 2021-07-05 NOTE — Progress Notes (Signed)
° °  Stroke Response Nurse Documentation Code Documentation  Gina Villarreal is a 76 y.o. female admitted to [facility]  on 07/04/21 for AMS with past medical hx of CAD, HTN, DM, OSA, rectal CA. On aspirin 81 mg daily. Code stroke was activated by 5W .   Patient on 5W unit where she was LKW at 59 with daughter at bedside.  Patient sneezed several times in a row, daughter stepped to other side of room to grab a tissue and when daughter turned back around noticed a left facial droop and slurred speech.   Stroke team at the bedside after patient activation. Patient to CT with team. NIHSS 6, see documentation for details and code stroke times. Patient with disoriented, left facial droop, left arm weakness, dysarthria , and Sensory  neglect on exam.   The following imaging was completed:  CT Head and CTA, MRI. Patient is not a candidate for IV Thrombolytic due to outside window, as stroke noted on 2/27 MRI after further review. Patient is not a candidate for IR due to no LVO.   Patient given 1mg  ativan for MRI, see MAR.  Care/Plan: q2h NIH, permissive hypertension.   Bedside handoff with RN Erasmo Downer.    Candace Cruise K  Stroke Response RN

## 2021-07-05 NOTE — ED Notes (Signed)
Pt continues to be confused, the daughter has had to fight her getting out of bed all night and doesn't want her to have additional ativan but I am worried that she will fall while attempting to get out of bed and pulling at leads/wires/ PIVs. MD notified, waiting for additional orders. Will continue to monitor.

## 2021-07-05 NOTE — Progress Notes (Signed)
°  PROGRESS NOTE  Called to bedside by RN this evening. Patient had an uneventful day until she sneezed about 3-4 times. After sneezing, daughter noticed that pt had a left facial droop and called the RN. Also noticed left sided weakness and slurred speech. Code stroke called. Rapid response RN and Neurology team at bedside. Pt on repeat exam with improving left-sided symptoms. Pt taken down to CT scan. Daughter at bedside updated and questions answered.     Dessa Phi, DO [facility] 07/05/2021, 6:26 PM  Available via Epic secure chat 7am-7pm After these hours, please refer to coverage provider listed on [email or web address]

## 2021-07-05 NOTE — Progress Notes (Signed)
°  Transition of Care [facility]) Screening Note   Patient Details  Name: Gina Villarreal Date of Birth: 08-23-1945   Transition of [facility] CM/SW Contact:    Benard Halsted, LCSW Phone Number: 07/05/2021, 12:05 PM    Transition of Care Department [facility]) has reviewed patient and no TOC needs have been identified at this time. We will continue to monitor patient advancement through interdisciplinary progression rounds. If new patient transition needs arise, please place a TOC consult.

## 2021-07-06 ENCOUNTER — Inpatient Hospital Stay (HOSPITAL_COMMUNITY): Payer: Medicare Other

## 2021-07-06 ENCOUNTER — Encounter (HOSPITAL_COMMUNITY): Payer: Self-pay | Admitting: Internal Medicine

## 2021-07-06 DIAGNOSIS — I6389 Other cerebral infarction: Secondary | ICD-10-CM

## 2021-07-06 LAB — CBC
HCT: 42.2 % (ref 36.0–46.0)
Hemoglobin: 14.6 g/dL (ref 12.0–15.0)
MCH: 29 pg (ref 26.0–34.0)
MCHC: 34.6 g/dL (ref 30.0–36.0)
MCV: 83.9 fL (ref 80.0–100.0)
Platelets: 373 10*3/uL (ref 150–400)
RBC: 5.03 MIL/uL (ref 3.87–5.11)
RDW: 13.2 % (ref 11.5–15.5)
WBC: 15.5 10*3/uL — ABNORMAL HIGH (ref 4.0–10.5)
nRBC: 0 % (ref 0.0–0.2)

## 2021-07-06 LAB — BASIC METABOLIC PANEL
Anion gap: 16 — ABNORMAL HIGH (ref 5–15)
BUN: 11 mg/dL (ref 8–23)
CO2: 21 mmol/L — ABNORMAL LOW (ref 22–32)
Calcium: 9 mg/dL (ref 8.9–10.3)
Chloride: 95 mmol/L — ABNORMAL LOW (ref 98–111)
Creatinine, Ser: 0.97 mg/dL (ref 0.44–1.00)
GFR, Estimated: >60 mL/min (ref >60)
Glucose, Bld: 158 mg/dL — ABNORMAL HIGH (ref 70–99)
Potassium: 3.2 mmol/L — ABNORMAL LOW (ref 3.5–5.1)
Sodium: 132 mmol/L — ABNORMAL LOW (ref 135–145)

## 2021-07-06 LAB — LIPID PANEL
Cholesterol: 121 mg/dL (ref 0–200)
HDL: 54 mg/dL (ref >40)
LDL Cholesterol: 36 mg/dL (ref 0–99)
Total CHOL/HDL Ratio: 2.2 RATIO
Triglycerides: 156 mg/dL — ABNORMAL HIGH (ref <150)
VLDL: 31 mg/dL (ref 0–40)

## 2021-07-06 LAB — GLUCOSE, CAPILLARY
Glucose-Capillary: 147 mg/dL — ABNORMAL HIGH (ref 70–99)
Glucose-Capillary: 146 mg/dL — ABNORMAL HIGH (ref 70–99)
Glucose-Capillary: 207 mg/dL — ABNORMAL HIGH (ref 70–99)
Glucose-Capillary: 198 mg/dL — ABNORMAL HIGH (ref 70–99)
Glucose-Capillary: 200 mg/dL — ABNORMAL HIGH (ref 70–99)
Glucose-Capillary: 148 mg/dL — ABNORMAL HIGH (ref 70–99)

## 2021-07-06 LAB — RPR: RPR Ser Ql: NONREACTIVE

## 2021-07-06 LAB — HEMOGLOBIN A1C
Hgb A1c MFr Bld: 6.9 % — ABNORMAL HIGH (ref 4.8–5.6)
Mean Plasma Glucose: 151.33 mg/dL

## 2021-07-06 LAB — ECHOCARDIOGRAM COMPLETE
AR max vel: 2.36 cm2
AV Peak grad: 9.4 mmHg
Ao pk vel: 1.54 m/s
Area-P 1/2: 2.9 cm2
Height: 65 in
S' Lateral: 1.4 cm
Weight: 2511.48 oz

## 2021-07-06 LAB — MAGNESIUM: Magnesium: 1.6 mg/dL — ABNORMAL LOW (ref 1.7–2.4)

## 2021-07-06 MED ORDER — LEVOTHYROXINE SODIUM 112 MCG PO TABS
112.0000 ug | ORAL_TABLET | Freq: Every day | ORAL | Status: DC
  Administered 2021-07-07 – 2021-07-08 (×2): 112 ug via ORAL
  Filled 2021-07-06 (×2): qty 1

## 2021-07-06 MED ORDER — MAGNESIUM SULFATE 2 GM/50ML IV SOLN
2.0000 g | Freq: Once | INTRAVENOUS | Status: AC
  Administered 2021-07-06: 2 g via INTRAVENOUS
  Filled 2021-07-06: qty 50

## 2021-07-06 MED ORDER — ENOXAPARIN SODIUM 40 MG/0.4ML IJ SOSY
40.0000 mg | PREFILLED_SYRINGE | INTRAMUSCULAR | Status: DC
  Administered 2021-07-06 – 2021-07-07 (×2): 40 mg via SUBCUTANEOUS
  Filled 2021-07-06 (×2): qty 0.4

## 2021-07-06 MED ORDER — POTASSIUM CHLORIDE CRYS ER 20 MEQ PO TBCR
40.0000 meq | EXTENDED_RELEASE_TABLET | Freq: Once | ORAL | Status: AC
  Administered 2021-07-06: 40 meq via ORAL
  Filled 2021-07-06: qty 2

## 2021-07-06 MED ORDER — ALUM & MAG HYDROXIDE-SIMETH 200-200-20 MG/5ML PO SUSP
30.0000 mL | ORAL | Status: DC | PRN
Start: 2021-07-06 — End: 2021-07-08
  Administered 2021-07-06 – 2021-07-07 (×3): 30 mL via ORAL
  Filled 2021-07-06 (×3): qty 30

## 2021-07-06 NOTE — Evaluation (Addendum)
Physical Therapy Evaluation ?Patient Details ?Name: Gina Villarreal ?MRN: [id number] ?DOB: 1945/06/19 ?Today's Date: 07/06/2021 ? ?History of Present Illness ? 76 y.o. female presenting to ED 2/27 with headaches and confusion. Patient admitted with acute metabolic encephalopathy and hypertensive urgency. L weakness, left facial droop, left sided neglect and left vision deficit on the evening of 2/28. Code stroke was activated. Deficits resolved at time of neuro exam with exception of hemianopsia. Imaging (-) for acute findings. PMHx significant for HTN, HLD, DMII and rectal cancer s/p colostomy.  ?Clinical Impression ? Pt admitted with above diagnosis. Pt was able to ambulate in hallway with LOB occasionally with challenge of which pt is able to self correct at times. Pt appeared to have left inattention and was made aware of compensatory strategies. Pt should progress well.   Pt currently with functional limitations due to the deficits listed below (see PT Problem List). Pt will benefit from skilled PT to increase their independence and safety with mobility to allow discharge to the venue listed below.      ?   ? ?Recommendations for follow up therapy are one component of a multi-disciplinary discharge planning process, led by the attending physician.  Recommendations may be updated based on patient status, additional functional criteria and insurance authorization. ? ?Follow Up Recommendations Home health PT ? ?  ?Assistance Recommended at Discharge Set up Supervision/Assistance  ?Patient can return home with the following ? A little help with walking and/or transfers;A little help with bathing/dressing/bathroom ? ?  ?Equipment Recommendations Other (comment) (TBA, possible RW)  ?Recommendations for Other Services ?    ?  ?Functional Status Assessment Patient has had a recent decline in their functional status and demonstrates the ability to make significant improvements in function in a reasonable and predictable  amount of time.  ? ?  ?Precautions / Restrictions Precautions ?Precautions: Fall ?Precaution Comments: monitor HR; colosotmy ?Restrictions ?Weight Bearing Restrictions: No  ? ?  ? ?Mobility ? Bed Mobility ?Overal bed mobility: Needs Assistance ?  ?  ?  ?  ?  ?  ?General bed mobility comments: in chair on arrival ?  ? ?Transfers ?Overall transfer level: Needs assistance ?Equipment used: None ?Transfers: Sit to/from Stand ?Sit to Stand: Min guard ?  ?  ?  ?  ?  ?General transfer comment: Min guard A for sit to stand from EOB. Cues for safe hand placement. ?  ? ?Ambulation/Gait ?Ambulation/Gait assistance: Min guard, Min assist ?Gait Distance (Feet): 150 Feet ?Assistive device: None, 1 person hand held assist ?Gait Pattern/deviations: Step-through pattern, Decreased stride length, Drifts right/left ?  ?Gait velocity interpretation: <1.31 ft/sec, indicative of household ambulator ?  ?General Gait Details: Pt was able to progress ambulation to hallway. Pt appeared to run into objects/get close to objects in hallway on left side and appeared to have decr awareness of her close proximity to items.  Pt did veer to left as well unless cued. Pt with occasional LOB to left with challenges however pt was able to self correct.  Pt and daughter made aware of this possible inattention of left and given some strategies to help pt compensate.  Pt also stopped by bathroom prior to walking back to chair. ? ?Stairs ?  ?  ?  ?  ?  ? ?Wheelchair Mobility ?  ? ?Modified Rankin (Stroke Patients Only) ?Modified Rankin (Stroke Patients Only) ?Pre-Morbid Rankin Score: No symptoms ?Modified Rankin: Moderately severe disability ? ?  ? ?Balance   ?  ?  ?  ?  ?  Standing balance support: Single extremity supported, During functional activity, No upper extremity supported ?Standing balance-Leahy Scale: Poor ?Standing balance comment: HHA for short-distance mobility vs no assist for static with min guard assist. Min assist for dynamic activity ?  ?   ?  ?  ?  ?  ?  ?  ?Standardized Balance Assessment ?Standardized Balance Assessment : Dynamic Gait Index ?  ?Dynamic Gait Index ?Level Surface: Mild Impairment ?Change in Gait Speed: Mild Impairment ?Gait with Horizontal Head Turns: Mild Impairment ?Gait with Vertical Head Turns: Mild Impairment ?Gait and Pivot Turn: Mild Impairment ?Step Over Obstacle: Mild Impairment ?Step Around Obstacles: Mild Impairment ?Steps: Moderate Impairment ?Total Score: 15 ?   ? ? ? ?Pertinent Vitals/Pain Pain Assessment ?Pain Assessment: No/denies pain  ? ? ?Home Living Family/patient expects to be discharged to:: Private residence ?Living Arrangements: Spouse/significant other (Spouse has dementia; able to be home alone with PRN supervision) ?Available Help at Discharge: Family;Available 24 hours/day ?Type of Home: House ?Home Access: Stairs to enter ?Entrance Stairs-Rails: None ?Entrance Stairs-Number of Steps: 2 ?  ?Home Layout: One level ?Home Equipment: None ?   ?  ?Prior Function Prior Level of Function : Independent/Modified Independent;Driving ?  ?  ?  ?  ?  ?  ?  ?  ?  ? ? ?Hand Dominance  ? Dominant Hand: Right ? ?  ?Extremity/Trunk Assessment  ? Upper Extremity Assessment ?Upper Extremity Assessment: Defer to OT evaluation ?  ? ?Lower Extremity Assessment ?Lower Extremity Assessment: Generalized weakness ?  ? ?Cervical / Trunk Assessment ?Cervical / Trunk Assessment: Kyphotic  ?Communication  ? Communication: No difficulties  ?Cognition Arousal/Alertness: Awake/alert ?Behavior During Therapy: Impulsive (Mild) ?Overall Cognitive Status: Impaired/Different from baseline ?Area of Impairment: Orientation, Attention, Memory, Safety/judgement, Awareness ?  ?  ?  ?  ?  ?  ?  ?  ?Orientation Level: Disoriented to, Time ?Current Attention Level: Sustained ?Memory: Decreased short-term memory ?  ?Safety/Judgement: Decreased awareness of safety, Decreased awareness of deficits ?Awareness: Emergent ?  ?General Comments: Patient  requires increased time to process verbal information, mildly impulsive with poor awareness of lines/leads. Daughter reports cognition improved compared to yesterday. ?  ?  ? ?  ?General Comments General comments (skin integrity, edema, etc.): VSS ? ?  ?Exercises    ? ?Assessment/Plan  ?  ?PT Assessment Patient needs continued PT services  ?PT Problem List Decreased activity tolerance;Decreased balance;Decreased mobility;Decreased knowledge of use of DME;Decreased safety awareness;Decreased knowledge of precautions ? ?   ?  ?PT Treatment Interventions DME instruction;Gait training;Functional mobility training;Therapeutic activities;Therapeutic exercise;Stair training;Balance training;Patient/family education   ? ?PT Goals (Current goals can be found in the Care Plan section)  ?Acute Rehab PT Goals ?Patient Stated Goal: to go home ?PT Goal Formulation: With patient ?Time For Goal Achievement: 07/20/21 ?Potential to Achieve Goals: Good ? ?  ?Frequency Min 4X/week ?  ? ? ?Co-evaluation   ?  ?  ?  ?  ? ? ?  ?AM-PAC PT "6 Clicks" Mobility  ?Outcome Measure Help needed turning from your back to your side while in a flat bed without using bedrails?: None ?Help needed moving from lying on your back to sitting on the side of a flat bed without using bedrails?: A Little ?Help needed moving to and from a bed to a chair (including a wheelchair)?: A Little ?Help needed standing up from a chair using your arms (e.g., wheelchair or bedside chair)?: A Little ?Help needed to walk in hospital room?: A Lot ?  Help needed climbing 3-5 steps with a railing? : A Lot ?6 Click Score: 17 ? ?  ?End of Session Equipment Utilized During Treatment: Gait belt ?Activity Tolerance: Patient limited by fatigue ?Patient left: in chair;with call bell/phone within reach;with family/visitor present ?Nurse Communication: Mobility status ?PT Visit Diagnosis: Muscle weakness (generalized) (M62.81) ?  ? ?Time: 6811-5726 ?PT Time Calculation (min) (ACUTE  ONLY): 13 min ? ? ?Charges:   PT Evaluation ?$PT Eval Moderate Complexity: 1 Mod ?  ?  ?   ? ? ?Letha Mirabal M,PT ?Acute Rehab Services ?[phone] ?[phone] (pager)  ? ?Alvira Philips ?07/06/2021, 1:45 PM ? ?

## 2021-07-06 NOTE — Progress Notes (Signed)
Patient with non sustained HR in the 140's, with coughing and at time while lying still. Patient asymptomatic, no complaints of chest pain. Received call from tele on more than one occasion. Provider made aware and will collect EKG if HR sustains. Will continue to monitor for now.  ?

## 2021-07-06 NOTE — Evaluation (Addendum)
Occupational Therapy Evaluation Patient Details Name: Gina Villarreal MRN: [id number] DOB: [birth year] Today's Date: 07/06/2021   History of Present Illness 76 y.o. female presenting to ED 2/27 with headaches and confusion. Patient admitted with acute metabolic encephalopathy and hypertensive urgency. L weakness, left facial droop, left sided neglect and left vision deficit on the evening of 2/28. Code stroke was activated. Deficits resolved at time of neuro exam with exception of hemianopsia. Imaging (-) for acute findings. PMHx significant for HTN, HLD, DMII and rectal cancer s/p colostomy.   Clinical Impression   PTA patient was living with her spouse (mild dementia but able to stay home alone with PRN supervision) and was grossly independent with ADLs/IADLs without AD. Patient was driving and completing IADLs with independence including meal prep. Patient currently functioning below baseline demonstrating observed ADLs including toileting, LB dressing and grooming standing at sink level with Min A and cues for sequencing/safety. HHA for mobility to and from commode in bathroom. Patient also limited by deficits listed below including decreased cognition, balance deficits and decreased activity tolerance and would benefit from continued acute OT services in prep for safe d/c home. Daughters able to provide near 24hr supervision/assist initially upon return home.      Recommendations for follow up therapy are one component of a multi-disciplinary discharge planning process, led by the attending physician.  Recommendations may be updated based on patient status, additional functional criteria and insurance authorization.   Follow Up Recommendations  Home health OT    Assistance Recommended at Discharge Frequent or constant Supervision/Assistance (Initially)  Patient can return home with the following A little help with walking and/or transfers;A little help with bathing/dressing/bathroom;Direct  supervision/assist for medications management;Direct supervision/assist for financial management;Assistance with cooking/housework    Functional Status Assessment  Patient has had a recent decline in their functional status and demonstrates the ability to make significant improvements in function in a reasonable and predictable amount of time.  Equipment Recommendations  BSC/3in1    Recommendations for Other Services       Precautions / Restrictions Precautions Precautions: Fall Precaution Comments: monitor HR; colosotmy Restrictions Weight Bearing Restrictions: No      Mobility Bed Mobility Overal bed mobility: Needs Assistance Bed Mobility: Supine to Sit     Supine to sit: Min assist     General bed mobility comments: Min A at trunk. Able to scoot anteriorly toward EOB with Min A to maintain static sitting balance. Cues for sequencing/safety.    Transfers Overall transfer level: Needs assistance Equipment used: 1 person hand held assist Transfers: Sit to/from Stand Sit to Stand: Min assist           General transfer comment: Min A for sit to stand from EOB with HHA. Cues for safe hand placement.      Balance Overall balance assessment: Needs assistance Sitting-balance support: Bilateral upper extremity supported, Feet supported Sitting balance-Leahy Scale: Poor Sitting balance - Comments: Min A initially to maintain static sitting balance at EOB. Min A with dynamic sitting balance. Postural control: Other (comment) (LOB in all directions with dynamic sitting.) Standing balance support: Single extremity supported, During functional activity Standing balance-Leahy Scale: Poor Standing balance comment: HHA for short-distance mobility in room.                           ADL either performed or assessed with clinical judgement   ADL Overall ADL's : Needs assistance/impaired Eating/Feeding: Set up;Sitting   Grooming:  Minimal  assistance;Standing Grooming Details (indicate cue type and reason): Cues for sequencing and location of ADL items. Upper Body Bathing: Minimal assistance;Sitting   Lower Body Bathing: Moderate assistance;Minimal assistance;Sit to/from stand   Upper Body Dressing : Minimal assistance;Sitting Upper Body Dressing Details (indicate cue type and reason): Donned anterior gown seated EOB. Min to [place] guard initially to maintain static sitting balance. Lower Body Dressing: Minimal assistance;Sit to/from stand Lower Body Dressing Details (indicate cue type and reason): Min A to doff/don mesh underwear in sitting/standing. Toilet Transfer: Minimal Print production planner Details (indicate cue type and reason): Min A with HHA. Cues for hand placement and use of grab bar. Toileting- Clothing Manipulation and Hygiene: Minimal assistance;Sit to/from stand Toileting - Clothing Manipulation Details (indicate cue type and reason): 3/3 toileting tasks to void bladder. Min A to burp colostomy bag.             Vision Baseline Vision/History: 1 Wears glasses (readers) Ability to See in Adequate Light: 0 Adequate Patient Visual Report: No change from baseline Vision Assessment?: No apparent visual deficits     Perception     Praxis      Pertinent Vitals/Pain Pain Assessment Pain Assessment: No/denies pain     Hand Dominance Right   Extremity/Trunk Assessment Upper Extremity Assessment Upper Extremity Assessment: Generalized weakness   Lower Extremity Assessment Lower Extremity Assessment: Defer to PT evaluation   Cervical / Trunk Assessment Cervical / Trunk Assessment: Kyphotic   Communication Communication Communication: No difficulties   Cognition Arousal/Alertness: Awake/alert Behavior During Therapy: Impulsive (Mild) Overall Cognitive Status: Impaired/Different from baseline Area of Impairment: Orientation, Attention, Memory, Safety/judgement, Awareness                  Orientation Level: Disoriented to, Time Current Attention Level: Sustained Memory: Decreased short-term memory   Safety/Judgement: Decreased awareness of safety, Decreased awareness of deficits Awareness: Emergent   General Comments: Patient requires increased time to process verbal information, mildly impulsive with poor awareness of lines/leads. Daughter reports cognition improved compared to yesterday.     General Comments  BP WNL; HR elevated throughout 126 max while donning footwear seated EOB. Daughter present at bedside throughout.    Exercises     Shoulder Instructions      Home Living Family/patient expects to be discharged to:: Private residence Living Arrangements: Spouse/significant other (Spouse has dementia; able to be home alone with PRN supervision) Available Help at Discharge: Family;Available 24 hours/day Type of Home: House Home Access: Stairs to enter [employer] of Steps: 2 Entrance Stairs-Rails: None Home Layout: One level     Bathroom Shower/Tub: Occupational psychologist: Handicapped height     Home Equipment: None          Prior Functioning/Environment Prior Level of Function : Independent/Modified Independent;Driving                        OT Problem List: Decreased strength;Decreased activity tolerance;Impaired balance (sitting and/or standing);Decreased cognition;Decreased safety awareness      OT Treatment/Interventions: Self-care/ADL training;Therapeutic exercise;Energy conservation;DME and/or AE instruction;Therapeutic activities;Cognitive remediation/compensation;Patient/family education;Balance training    OT Goals(Current goals can be found in the care plan section) Acute Rehab OT Goals Patient Stated Goal: To return home. OT Goal Formulation: With patient Time For Goal Achievement: 07/20/21 Potential to Achieve Goals: Good ADL Goals Pt Will Perform Grooming: with modified independence Pt Will  Perform Upper Body Dressing: with modified independence;sitting Pt Will Perform Lower Body Dressing:  with modified independence;sit to/from stand Pt Will Transfer to Toilet: with modified independence;ambulating Pt Will Perform Toileting - Clothing Manipulation and hygiene: with modified independence;sit to/from stand Additional ADL Goal #1: Patient will score <4/28 on SBT indicating improved cognition in prep for ADLs.  OT Frequency: Min 2X/week    Co-evaluation              AM-PAC OT "6 Clicks" Daily Activity     Outcome Measure Help from another person eating meals?: A Little Help from another person taking care of personal grooming?: A Little Help from another person toileting, which includes using toliet, bedpan, or urinal?: A Little Help from another person bathing (including washing, rinsing, drying)?: A Little Help from another person to put on and taking off regular upper body clothing?: A Little Help from another person to put on and taking off regular lower body clothing?: A Little 6 Click Score: 18   End of Session Equipment Utilized During Treatment: Gait belt Nurse Communication: Mobility status  Activity Tolerance: Patient tolerated treatment well Patient left: in chair;with call bell/phone within reach;with family/visitor present  OT Visit Diagnosis: Unsteadiness on feet (R26.81);Other abnormalities of gait and mobility (R26.89);Muscle weakness (generalized) (M62.81);Other symptoms and signs involving cognitive function                Time: 1005-1035 OT Time Calculation (min): 30 min Charges:  OT General Charges $OT Visit: 1 Visit OT Evaluation $OT Eval Low Complexity: 1 Low OT Treatments $Self Care/Home Management : 8-22 mins  Dusti Tetro H. OTR/L Supplemental OT, Department of rehab services [phone]  Chelsie Burel R H. 07/06/2021, 10:56 AM

## 2021-07-06 NOTE — Procedures (Signed)
Patient Name: Gina Villarreal  ?MRN: [id number]  ?Epilepsy Attending: Lora Havens  ?Referring Physician/Provider: Donnetta Simpers ?Date: 07/06/2021 ?Duration: 22.34 mins ? ?Patient history: 76 y.o. female stroke risk factors including diabetes, hypertension, hyperlipidemia and rectal cancer status post colostomy who was admitted with confusion and headache.  EEG to evaluate for seizure ? ?Level of alertness: Awake,asleep ? ?AEDs during EEG study: None ? ?Technical aspects: This EEG study was done with scalp electrodes positioned according to the 10-20 International system of electrode placement. Electrical activity was acquired at a sampling rate of 500Hz  and reviewed with a high frequency filter of 70Hz  and a low frequency filter of 1Hz . EEG data were recorded continuously and digitally stored.  ? ?Description: The posterior dominant rhythm consists of 9 Hz activity of moderate voltage (25-35 uV) seen predominantly in posterior head regions, symmetric and reactive to eye opening and eye closing. Sleep was characterized by vertex waves, sleep spindles (12 to 14 Hz), maximal frontocentral region. Hyperventilation and photic stimulation were not performed.    ? ?IMPRESSION: ?This study is within normal limits. No seizures or epileptiform discharges were seen throughout the recording. ? ?Lora Havens  ? ?

## 2021-07-06 NOTE — Progress Notes (Signed)
Echocardiogram ?2D Echocardiogram has been performed. ? ?Gina Villarreal ?07/06/2021, 9:06 AM ?

## 2021-07-06 NOTE — Progress Notes (Addendum)
STROKE TEAM PROGRESS NOTE   INTERVAL HISTORY Her daughter is at the bedside.  Had an episode of sneezing and then developed transient left-sided weakness.  Suspicion for a punctate infarct on MRI  Vitals:   07/06/21 0200 07/06/21 0301 07/06/21 0810 07/06/21 1124  BP: (!) 173/88 (!) 154/72 (!) 112/99 137/76  Pulse: (!) 118 99 85 81  Resp: (!) 21 18 17 17   Temp:  98 F (36.7 C) 97.8 F (36.6 C) 97.9 F (36.6 C)  TempSrc:  Oral Oral Oral  SpO2: 93%  95% 96%  Weight:      Height:       CBC:  Recent Labs  Lab 07/04/21 1131 07/05/21 0254 07/05/21 0303 07/06/21 0420  WBC 11.4* 13.2*  --  15.5*  NEUTROABS 9.4*  --   --   --   HGB 14.0 13.5 13.3 14.6  HCT 41.1 38.3 39.0 42.2  MCV 84.6 82.9  --  83.9  PLT 428* 386  --  829   Basic Metabolic Panel:  Recent Labs  Lab 07/05/21 0254 07/05/21 0303 07/06/21 0420  NA 130* 130* 132*  K 3.0* 3.1* 3.2*  CL 91*  --  95*  CO2 27  --  21*  GLUCOSE 92  --  158*  BUN 13  --  11  CREATININE 0.91  --  0.97  CALCIUM 9.2  --  9.0  MG 1.9  --  1.6*   Lipid Panel:  Recent Labs  Lab 07/06/21 0420  CHOL 121  TRIG 156*  HDL 54  CHOLHDL 2.2  VLDL 31  LDLCALC 36   HgbA1c:  Recent Labs  Lab 07/06/21 0420  HGBA1C 6.9*   Urine Drug Screen:  Recent Labs  Lab 07/05/21 1022  LABOPIA NONE DETECTED  COCAINSCRNUR NONE DETECTED  LABBENZ POSITIVE*  AMPHETMU NONE DETECTED  THCU NONE DETECTED  LABBARB NONE DETECTED    Alcohol Level No results for input(s): ETH in the last 168 hours.  IMAGING past 24 hours MR BRAIN WO CONTRAST  Result Date: 07/05/2021 CLINICAL DATA:  Acute neurologic deficits EXAM: MRI HEAD WITHOUT CONTRAST TECHNIQUE: Multiplanar, multiecho pulse sequences of the brain and surrounding structures were obtained without intravenous contrast. COMPARISON:  None. FINDINGS: Motion degraded and truncated examination. Diffusion-weighted imaging shows no acute ischemia. Otherwise limited brain MRI shows a partially empty sella.  IMPRESSION: Motion degraded and truncated examination. No acute ischemia. These results were communicated to Dr. Donnetta Simpers at 7:24 pm on 07/05/2021 by text page via the [facility] messaging system. Electronically Signed   By: Ulyses Jarred M.D.   On: 07/05/2021 19:24   ECHOCARDIOGRAM COMPLETE  Result Date: 07/06/2021    ECHOCARDIOGRAM REPORT   Patient Name:   Gina Villarreal Date of Exam: 07/06/2021 Medical Rec #:  [id number]     Height:       65.0 in Accession #:    [id number]    Weight:       157.0 lb Date of Birth:  April 08, 1946     BSA:          1.785 m Patient Age:    76 years      BP:           112/99 mmHg Patient Gender: F             HR:           104 bpm. Exam Location:  Inpatient Procedure: 2D Echo Indications:    Stroke  History:  Patient has prior history of Echocardiogram examinations, most                 recent 03/03/2015. CAD; Risk Factors:Diabetes.  Sonographer:    Jefferey Pica Referring Phys: [id number] [place]  1. Left ventricular ejection fraction, by estimation, is 70 to 75%. The left ventricle has hyperdynamic function. The left ventricle has no regional wall motion abnormalities. There is moderate left ventricular hypertrophy. Left ventricular diastolic parameters are consistent with Grade I diastolic dysfunction (impaired relaxation).  2. Right ventricular systolic function is normal. The right ventricular size is normal.  3. The mitral valve is normal in structure. Trivial mitral valve regurgitation.  4. The aortic valve is normal in structure. Aortic valve regurgitation is not visualized. No aortic stenosis is present. FINDINGS  Left Ventricle: Left ventricular ejection fraction, by estimation, is 70 to 75%. The left ventricle has hyperdynamic function. The left ventricle has no regional wall motion abnormalities. The left ventricular internal cavity size was normal in size. There is moderate left ventricular hypertrophy. Left ventricular diastolic parameters are  consistent with Grade I diastolic dysfunction (impaired relaxation). Right Ventricle: The right ventricular size is normal. Right vetricular wall thickness was not well visualized. Right ventricular systolic function is normal. Left Atrium: Left atrial size was normal in size. Right Atrium: Right atrial size was normal in size. Pericardium: There is no evidence of pericardial effusion. Mitral Valve: The mitral valve is normal in structure. Trivial mitral valve regurgitation. Tricuspid Valve: The tricuspid valve is normal in structure. Tricuspid valve regurgitation is trivial. Aortic Valve: The aortic valve is normal in structure. Aortic valve regurgitation is not visualized. No aortic stenosis is present. Aortic valve peak gradient measures 9.4 mmHg. Pulmonic Valve: The pulmonic valve was normal in structure. Pulmonic valve regurgitation is not visualized. Aorta: The aortic root and ascending aorta are structurally normal, with no evidence of dilitation. IAS/Shunts: The atrial septum is grossly normal.  LEFT VENTRICLE PLAX 2D LVIDd:         3.10 cm   Diastology LVIDs:         1.40 cm   LV e' lateral:   4.73 cm/s LV PW:         1.50 cm   LV E/e' lateral: 13.2 LV IVS:        1.60 cm LVOT diam:     1.90 cm LV SV:         68 LV SV Index:   38 LVOT Area:     2.84 cm  IVC IVC diam: 1.50 cm LEFT ATRIUM             Index        RIGHT ATRIUM          Index LA diam:        2.50 cm 1.40 cm/m   RA Area:     9.51 cm LA Vol (A2C):   24.6 ml 13.78 ml/m  RA Volume:   17.90 ml 10.03 ml/m LA Vol (A4C):   26.5 ml 14.85 ml/m LA Biplane Vol: 27.3 ml 15.30 ml/m  AORTIC VALVE                 PULMONIC VALVE AV Area (Vmax): 2.36 cm     PV Vmax:       0.94 m/s AV Vmax:        153.50 cm/s  PV Peak grad:  3.6 mmHg AV Peak Grad:   9.4 mmHg LVOT Vmax:  128.00 cm/s LVOT Vmean:     80.700 cm/s LVOT VTI:       0.240 m  AORTA Ao Root diam: 3.00 cm Ao Asc diam:  2.80 cm MITRAL VALVE MV Area (PHT): 2.90 cm     SHUNTS MV Decel Time: 262  msec     Systemic VTI:  0.24 m MV E velocity: 62.60 cm/s   Systemic Diam: 1.90 cm MV A velocity: 118.00 cm/s MV E/A ratio:  0.53 Mertie Moores MD Electronically signed by Mertie Moores MD Signature Date/Time: 07/06/2021/11:24:26 AM    Final    CT HEAD CODE STROKE WO CONTRAST  Result Date: 07/05/2021 CLINICAL DATA:  Code stroke. Acute neuro deficit. Slurred speech facial droop EXAM: CT HEAD WITHOUT CONTRAST TECHNIQUE: Contiguous axial images were obtained from the base of the skull through the vertex without intravenous contrast. RADIATION DOSE REDUCTION: This exam was performed according to the departmental dose-optimization program which includes automated exposure control, adjustment of the mA and/or kV according to patient size and/or use of iterative reconstruction technique. COMPARISON:  CT head 07/04/2021.  MRI head 07/04/2021 FINDINGS: Brain: Ventricle size and cerebral volume normal for age. Negative for acute hemorrhage or mass. Review of the MRI yesterday reveals a small focus of restricted diffusion in the left middle frontal gyrus likely due to a small acute infarct approximally 3 mm. This is not identified by CT. Vascular: Negative for hyperdense vessel Skull: Negative Sinuses/Orbits: Paranasal sinuses clear.  Negative orbit Other: None ASPECTS ([place] Stroke Program Early CT Score) - Ganglionic level infarction (caudate, lentiform nuclei, internal capsule, insula, M1-M3 cortex): 7 - Supraganglionic infarction (M4-M6 cortex): 3 Total score (0-10 with 10 being normal): 10 IMPRESSION: 1. CT head negative for acute abnormality. 2. ASPECTS is 10 3. Review of MRI yesterday reveals a small 3 mm focus of restricted diffusion left frontal cortex compatible with acute infarct. 4. These results were called by telephone at the time of interpretation on 07/05/2021 at 6:54 pm to provider [facility] , who verbally acknowledged these results. Electronically Signed   By: Franchot Gallo M.D.   On: 07/05/2021  18:55   CT ANGIO HEAD NECK W WO CM (CODE STROKE)  Result Date: 07/05/2021 CLINICAL DATA:  Stroke.  Slurred speech and facial droop. EXAM: CT ANGIOGRAPHY HEAD AND NECK TECHNIQUE: Multidetector CT imaging of the head and neck was performed using the standard protocol during bolus administration of intravenous contrast. Multiplanar CT image reconstructions and MIPs were obtained to evaluate the vascular anatomy. Carotid stenosis measurements (when applicable) are obtained utilizing NASCET criteria, using the distal internal carotid diameter as the denominator. RADIATION DOSE REDUCTION: This exam was performed according to the departmental dose-optimization program which includes automated exposure control, adjustment of the mA and/or kV according to patient size and/or use of iterative reconstruction technique. CONTRAST:  28mL OMNIPAQUE IOHEXOL 350 MG/ML SOLN COMPARISON:  CT angio head and neck 07/04/2021.  CT head 07/05/2021 FINDINGS: CTA NECK FINDINGS Aortic arch: Standard branching. Imaged portion shows no evidence of aneurysm or dissection. No significant stenosis of the major arch vessel origins. Mild atherosclerotic aorta. Right carotid system: Mild atherosclerotic disease right carotid bifurcation. Negative for right carotid stenosis or dissection. Left carotid system: Mild atherosclerotic calcification left carotid bifurcation. Negative for stenosis or dissection. Vertebral arteries: Small vertebral arteries bilaterally without stenosis. Skeleton: Mild degenerative change cervical spine. No acute skeletal abnormality. Other neck: Negative for mass or adenopathy in the neck. Upper chest: Lung apices clear bilaterally. Review of the MIP images  confirms the above findings CTA HEAD FINDINGS Anterior circulation: Internal carotid artery widely patent through the skull base and cavernous segment. Mild atherosclerotic disease in the cavernous carotid bilaterally. Anterior and middle cerebral arteries widely patent  without stenosis or large vessel occlusion. Negative for aneurysm. Posterior circulation: Both vertebral arteries patent to the basilar. PICA patent bilaterally. Basilar patent. Superior cerebellar arteries patent bilaterally. Fetal origin of the posterior cerebral artery bilaterally without stenosis. Venous sinuses: Normal venous enhancement Anatomic variants: None Review of the MIP images confirms the above findings IMPRESSION: 1. No intracranial large vessel occlusion or significant stenosis 2. Mild atherosclerotic disease in the carotid bifurcation bilaterally without stenosis. Both vertebral arteries are patent without stenosis. Electronically Signed   By: Franchot Gallo M.D.   On: 07/05/2021 19:02    PHYSICAL EXAM  Physical Exam  Constitutional: Appears well-developed and well-nourished.  Cardiovascular: Normal rate and regular rhythm.  Respiratory: Effort normal, non-labored breathing  Neuro: Mental Status: Patient is awake, alert, oriented to person, place, month, and year.  Confusion has improved per daughter.  Intermittent confusion on year and age however she has been self-correcting. No signs of aphasia or neglect Slight dysarthria and slow hypophonic speech. Psychomotor slowing  Cranial Nerves: II: Visual Fields are full. Pupils are equal, round, and reactive to light.   III,IV, VI: EOMI without ptosis or diploplia.  V: Facial sensation is symmetric to temperature VII: Facial movement is symmetric resting and smiling VIII: Hearing is intact to voice X: Palate elevates symmetrically XI: Shoulder shrug is symmetric. XII: Tongue protrudes midline without atrophy or fasciculations.   Motor: Tone is normal. Bulk is normal. 5/5 strength was present in all four extremities.  Sensory: Sensation is symmetric to light touch and temperature in the arms and legs. No extinction to DSS present.  Deep Tendon Reflexes: 2+ and symmetric in the biceps and patellae.  Plantars: Toes are  downgoing bilaterally.  Cerebellar: Mild left hand ataxia with FNF Gait:  Leans to the left   ASSESSMENT/PLAN Ms. Gina Villarreal is a 76 y.o. female with history of HTN, HLD, DM-2, CAD, colon cancer-s/p ostomy presenting with confusion, headaches, and fluctuating neurological symptoms.  She was recently started on oxycodone for headaches and her daughter stated that she had visual hallucinations after taking half of a pill.  She developed transient left-sided weakness after a sneezing spell for which a code stroke was called.   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  49mm infarct in the left frontal cortex 2D Echo EF is 70-75%, normal atrial sizes, hyperdynamic left ventricle LDL 36 HgbA1c 6.9 VTE prophylaxis -Lovenox aspirin 81 mg daily prior to admission, now on aspirin 81 mg daily and clopidogrel 75 mg daily.  Previously on plavix for stents Therapy recommendations:  Pending Disposition: Pending  Hypertension Home meds: Lisinopril-hydrochlorothiazide Stable Permissive hypertension (OK if < 220/120) but gradually normalize in 5-7 days Long-term BP goal normotensive PRN hydralazine  Hyperlipidemia Home meds:  Pravastatin, Zetia resumed in hospital LDL 36, goal < 70 Continue statin at discharge  Diabetes type II Controlled Home meds: Metformin, glimperimide HgbA1c 6.9, goal < 7.0 CBGs Recent Labs    07/06/21 0320 07/06/21 0813 07/06/21 1124  GLUCAP 147* 146* 207*    SSI  Other Stroke Risk Factors Advanced Age >/= 65  Coronary artery disease PCI to RCA 2003,  PCI to LAD in 2016 she was briefly on Plavix after this Migraines/headaches Started on the oxycodone by PCP Intractable headache-resolved with IV Toradol, primary team to trial IV Depakote  Other Active Problems Hypothyroidism Synthroid  resumed by primary team Electrolyte abnormalities-replete per primary team Hypokalemia, hyponatremia  Hospital day # 1  Patient seen and examined by NP/APP with MD. MD to update note as needed.   Janine Ores, DNP, FNP-BC [facility] Pager: [phone]  ATTENDING ATTESTATION:  Things have-year-old history of diabetes hypertension hyperlipidemia and rectal cancer status post colostomy who was admitted initially for headaches and confusion.  She had new onset left facial droop left side neglect and hemianopia and code stroke was called.  MRI was completed was found to have a small frontal completed stroke on previous MRI.  Stat limited MRI did not show acute CVA.Marland Kitchen  CTA was negative.  Not a candidate for TNK or thrombectomy.  EEG was completed and negative for seizures.  Currently she is back to her baseline except she is little bit slow in her thinking process but able to answer orientation questions a little mixed up with the year but eventually able to get correctly.  It appears more encephalopathic.  Stroke work-up was completed.  Aspirin Plavix for 21 days followed by aspirin 1 mg alone.  Neurology will sign off, please call with questions.  Dr. Reeves Forth evaluated pt independently, reviewed imaging, chart, labs. Discussed and formulated plan with the APP. Please see APP note above for details.   Total 36 minutes spent on counseling patient and coordinating care, writing notes and reviewing chart.  Jeshawn Melucci,MD   To contact Stroke Continuity provider, please refer to [email or web address] After hours, contact General Neurology

## 2021-07-06 NOTE — Progress Notes (Signed)
PROGRESS NOTE        PATIENT DETAILS Name: Gina Villarreal Age: 76 y.o. Sex: female Date of Birth: [birth year] Admit Date: 07/04/2021 Admitting Physician Norval Morton, MD [id number], Gwyndolyn Saxon, MD  Brief Summary: Patient is a 76 y.o.  female with history of HTN, HLD, DM-2, CAD, colon cancer-s/p ostomy-presented to the hospital with 2-3-week history of headache, intermittent diplopia-and confusion (recently started on oxycodone for headache).  She was subsequently admitted to the hospitalist service, posthospitalization-she developed transient left-sided weakness after a sneezing spell.  See below for further details.  Significant Hospital events: 2/27>> admit for evaluation of worsening frontal headaches, diplopia and confusion (recently started on oxycodone) 2/28>> sneezing spell-left-sided weakness-code stroke.  Significant studies: 2/27>> CXR: No PNA 2/27>> MRI brain: 3 mm cortical infarct per neurology note (official reading is negative for CVA) 2/27>> CT angio head/neck: No LVO or hemodynamically significant stenosis. 2/28>> CT head code stroke: No acute intracranial abnormality 2/28>> CT angio head/neck code stroke: No LVO 2/28>> MRI brain: No acute ischemia 3/01>> LDL 36 3/01>> A1c: 6.9 3/01>> Echo: EF 70-75%  Significant microbiology data: 2/27>> COVID/influenza PCR: Negative  Procedures: None  Consults: Neurology  Subjective: Lying comfortably in bed-denies any chest pain or shortness of breath.  Objective: Vitals: Blood pressure 137/76, pulse 81, temperature 97.9 F (36.6 C), temperature source Oral, resp. rate 17, height 5\' 5"  (1.651 m), weight 71.2 kg, SpO2 96 %.   Exam: Gen Exam:Alert awake-not in any distress HEENT:atraumatic, normocephalic Chest: B/L clear to auscultation anteriorly CVS:S1S2 regular Abdomen:soft non tender, non distended Extremities:no edema Neurology: Non focal Skin: no rash  Pertinent Labs/Radiology: CBC  Latest Ref Rng & Units 07/06/2021 07/05/2021 07/05/2021  WBC 4.0 - 10.5 K/uL 15.5(H) - 13.2(H)  Hemoglobin 12.0 - 15.0 g/dL 14.6 13.3 13.5  Hematocrit 36.0 - 46.0 % 42.2 39.0 38.3  Platelets 150 - 400 K/uL 373 - 386    Lab Results  Component Value Date   NA 132 (L) 07/06/2021   K 3.2 (L) 07/06/2021   CL 95 (L) 07/06/2021   CO2 21 (L) 07/06/2021      Assessment/Plan: Acute toxic encephalopathy: Suspect encephalopathy on admission was likely due to recent introduction of narcotic use for headaches.  She is much better-continue with supportive care.  Intractable headache: Likely migraine headaches-resolved-after treatment with IV Toradol.  Reviewed med history-it does not appear patient got Depakote and IV steroids.  If headaches recur-we will try IV Depakote.   Transient left-sided facial droop/blurry vision/double vision:?  Secondary to complicated migraine.  Unclear whether acute stroke on MRI is an incidental finding.  Will await further recommendations from neurology service.  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  Awaiting further recommendations from neurology-continue aspirin/Plavix-see work-up as above.    CAD-s/p PCI to RCA 2003, PCI to LAD in 2016: Continue antiplatelet/statin.  Currently with no anginal symptoms.  Hypokalemia: Replete and recheck  Hyponatremia: Mild-of no clinical significance.  Leukocytosis: No indication of infection-watch closely.  HTN: BP stable-allow permissive hypertension  HLD: Continue statin/Zetia  DM-2: Continue SSI  Recent Labs    07/06/21 0320 07/06/21 0813 07/06/21 1124  GLUCAP 147* 146* 207*    Hypothyroidism: Continue with Synthroid.  TSH slightly elevated-unclear if her dosage of Synthroid was recently adjusted-stable for  outpatient follow-up with PCP.  History of rectal cancer-s/p colectomy in 2005: Continue with ostomy  care  BMI: Estimated body mass index is 26.12 kg/m as calculated from the following:   Height as of this encounter: 5\' 5"  (1.651 m).   Weight as of this encounter: 71.2 kg.   Code status:   Code Status: Full Code   DVT Prophylaxis: enoxaparin (LOVENOX) injection 40 mg Start: 07/06/21 1345   Family Communication: Daughter at bedside   Disposition Plan: Status is: Inpatient Remains inpatient appropriate because: Acute CVA work-up in progress.    Planned Discharge Destination:Home   Diet: Diet Order             Diet heart healthy/carb modified Room service appropriate? Yes; Fluid consistency: Thin  Diet effective now                     Antimicrobial agents: Anti-infectives (From admission, onward)    None        MEDICATIONS: Scheduled Meds:   stroke: mapping our early stages of recovery book   Does not apply Once   aspirin  81 mg Oral Daily   clopidogrel  75 mg Oral Daily   enoxaparin (LOVENOX) injection  40 mg Subcutaneous Q24H   ezetimibe  10 mg Oral Daily   insulin aspart  0-9 Units Subcutaneous Q4H   [START ON 07/07/2021] levothyroxine  112 mcg Oral Q0600   pravastatin  40 mg Oral BID   sodium chloride flush  3 mL Intravenous Q12H   Continuous Infusions: PRN Meds:.acetaminophen **OR** acetaminophen, albuterol, haloperidol lactate, hydrALAZINE, ketorolac, ondansetron **OR** ondansetron (ZOFRAN) IV   I have personally reviewed following labs and imaging studies  LABORATORY DATA: CBC: Recent Labs  Lab 06/30/21 0812 07/04/21 1131 07/05/21 0254 07/05/21 0303 07/06/21 0420  WBC 12.5* 11.4* 13.2*  --  15.5*  NEUTROABS  --  9.4*  --   --   --   HGB 13.6 14.0 13.5 13.3 14.6  HCT 40.0 41.1 38.3 39.0 42.2  MCV 83.3 84.6 82.9  --  83.9  PLT 390 428* 386  --  741    Basic Metabolic Panel: Recent Labs  Lab 06/30/21 0812 07/04/21 1131 07/05/21 0254 07/05/21 0303 07/06/21 0420  NA 134* 131* 130* 130* 132*  K 3.2* 3.4* 3.0* 3.1* 3.2*  CL 93*  89* 91*  --  95*  CO2 21* 26 27  --  21*  GLUCOSE 254* 295* 92  --  158*  BUN 17 15 13   --  11  CREATININE 0.89 0.92 0.91  --  0.97  CALCIUM 9.9 9.4 9.2  --  9.0  MG  --  1.2* 1.9  --  1.6*    GFR: Estimated Creatinine Clearance: 49.6 mL/min (by C-G formula based on SCr of 0.97 mg/dL).  Liver Function Tests: Recent Labs  Lab 06/30/21 0812 07/05/21 0950  AST 16 18  ALT 19 17  ALKPHOS 61 57  BILITOT 1.0 1.1  PROT 7.2 6.5  ALBUMIN 4.7 3.7   Recent Labs  Lab 06/30/21 0812 07/05/21 0950  LIPASE 68* 37   No results for input(s): AMMONIA in the last 168 hours.  Coagulation Profile: No results for input(s): INR, PROTIME in the last 168 hours.  Cardiac Enzymes: No results for input(s): CKTOTAL, CKMB, CKMBINDEX, TROPONINI in the last 168 hours.  BNP (last 3 results) No results for input(s): PROBNP in the last 8760 hours.  Lipid Profile: Recent Labs    07/06/21 0420  CHOL 121  HDL 54  LDLCALC 36  TRIG 156*  CHOLHDL 2.2    Thyroid Function Tests: Recent Labs    07/05/21 0950 07/05/21 1406  TSH 8.872*  --   FREET4  --  1.31*    Anemia Panel: No results for input(s): VITAMINB12, FOLATE, FERRITIN, TIBC, IRON, RETICCTPCT in the last 72 hours.  Urine analysis:    Component Value Date/Time   COLORURINE YELLOW 07/04/2021 1610   APPEARANCEUR CLEAR 07/04/2021 1610   LABSPEC 1.012 07/04/2021 1610   PHURINE 6.0 07/04/2021 1610   GLUCOSEU >=500 (A) 07/04/2021 1610   HGBUR LARGE (A) 07/04/2021 1610   BILIRUBINUR NEGATIVE 07/04/2021 1610   KETONESUR 20 (A) 07/04/2021 1610   PROTEINUR 100 (A) 07/04/2021 1610   UROBILINOGEN 0.2 10/31/2009 2059   NITRITE NEGATIVE 07/04/2021 1610   LEUKOCYTESUR NEGATIVE 07/04/2021 1610    Sepsis Labs: Lactic Acid, Venous    Component Value Date/Time   LATICACIDVEN 1.8 11/01/2009 0510    MICROBIOLOGY: Recent Results (from the past 240 hour(s))  Resp Panel by RT-PCR (Flu A&B, Covid) Nasopharyngeal Swab     Status: None    Collection Time: 06/30/21  9:35 AM   Specimen: Nasopharyngeal Swab; Nasopharyngeal(NP) swabs in vial transport medium  Result Value Ref Range Status   SARS Coronavirus 2 by RT PCR NEGATIVE NEGATIVE Final    Comment: (NOTE) SARS-CoV-2 target nucleic acids are NOT DETECTED.  The SARS-CoV-2 RNA is generally detectable in upper respiratory specimens during the acute phase of infection. The lowest concentration of SARS-CoV-2 viral copies this assay can detect is 138 copies/mL. A negative result does not preclude SARS-Cov-2 infection and should not be used as the sole basis for treatment or other patient management decisions. A negative result may occur with  improper specimen collection/handling, submission of specimen other than nasopharyngeal swab, presence of viral mutation(s) within the areas targeted by this assay, and inadequate number of viral copies(<138 copies/mL). A negative result must be combined with clinical observations, patient history, and epidemiological information. The expected result is Negative.  Fact Sheet for Patients:  EntrepreneurPulse.com.au  Fact Sheet for Healthcare Providers:  IncredibleEmployment.be  This test is no t yet approved or cleared by the [place] FDA and  has been authorized for detection and/or diagnosis of SARS-CoV-2 by FDA under an Emergency Use Authorization (EUA). This EUA will remain  in effect (meaning this test can be used) for the duration of the COVID-19 declaration under Section 564(b)(1) of the Act, 21 U.S.C.section 360bbb-3(b)(1), unless the authorization is terminated  or revoked sooner.       Influenza A by PCR NEGATIVE NEGATIVE Final   Influenza B by PCR NEGATIVE NEGATIVE Final    Comment: (NOTE) The Xpert Xpress SARS-CoV-2/FLU/RSV plus assay is intended as an aid in the diagnosis of influenza from Nasopharyngeal swab specimens and should not be used as a sole basis for treatment.  Nasal washings and aspirates are unacceptable for Xpert Xpress SARS-CoV-2/FLU/RSV testing.  Fact Sheet for Patients: EntrepreneurPulse.com.au  Fact Sheet for Healthcare Providers: IncredibleEmployment.be  This test is not yet approved or cleared by the [place] FDA and has been authorized for detection and/or diagnosis of SARS-CoV-2 by FDA under an Emergency Use Authorization (EUA). This EUA will remain in effect (meaning this test can be used) for the duration of the COVID-19 declaration under Section 564(b)(1) of the Act, 21 U.S.C. section 360bbb-3(b)(1), unless the authorization is terminated or revoked.  Performed at [facility], [address] [place],  [place] [address]   Resp Panel by RT-PCR (Flu A&B, Covid) Nasopharyngeal Swab     Status: None   Collection Time: 07/04/21  1:34 PM   Specimen: Nasopharyngeal Swab; Nasopharyngeal(NP) swabs in vial transport medium  Result Value Ref Range Status   SARS Coronavirus 2 by RT PCR NEGATIVE NEGATIVE Final    Comment: (NOTE) SARS-CoV-2 target nucleic acids are NOT DETECTED.  The SARS-CoV-2 RNA is generally detectable in upper respiratory specimens during the acute phase of infection. The lowest concentration of SARS-CoV-2 viral copies this assay can detect is 138 copies/mL. A negative result does not preclude SARS-Cov-2 infection and should not be used as the sole basis for treatment or other patient management decisions. A negative result may occur with  improper specimen collection/handling, submission of specimen other than nasopharyngeal swab, presence of viral mutation(s) within the areas targeted by this assay, and inadequate number of viral copies(<138 copies/mL). A negative result must be combined with clinical observations, patient history, and epidemiological information. The expected result is Negative.  Fact Sheet for Patients:   EntrepreneurPulse.com.au  Fact Sheet for Healthcare Providers:  IncredibleEmployment.be  This test is no t yet approved or cleared by the [place] FDA and  has been authorized for detection and/or diagnosis of SARS-CoV-2 by FDA under an Emergency Use Authorization (EUA). This EUA will remain  in effect (meaning this test can be used) for the duration of the COVID-19 declaration under Section 564(b)(1) of the Act, 21 U.S.C.section 360bbb-3(b)(1), unless the authorization is terminated  or revoked sooner.       Influenza A by PCR NEGATIVE NEGATIVE Final   Influenza B by PCR NEGATIVE NEGATIVE Final    Comment: (NOTE) The Xpert Xpress SARS-CoV-2/FLU/RSV plus assay is intended as an aid in the diagnosis of influenza from Nasopharyngeal swab specimens and should not be used as a sole basis for treatment. Nasal washings and aspirates are unacceptable for Xpert Xpress SARS-CoV-2/FLU/RSV testing.  Fact Sheet for Patients: EntrepreneurPulse.com.au  Fact Sheet for Healthcare Providers: IncredibleEmployment.be  This test is not yet approved or cleared by the [place] FDA and has been authorized for detection and/or diagnosis of SARS-CoV-2 by FDA under an Emergency Use Authorization (EUA). This EUA will remain in effect (meaning this test can be used) for the duration of the COVID-19 declaration under Section 564(b)(1) of the Act, 21 U.S.C. section 360bbb-3(b)(1), unless the authorization is terminated or revoked.  Performed at [facility], [place] [address] [place], [place] [address]     RADIOLOGY STUDIES/RESULTS: CT ANGIO HEAD NECK W WO CM  Result Date: 07/04/2021 CLINICAL DATA:  Altered mental status EXAM: CT ANGIOGRAPHY HEAD AND NECK TECHNIQUE: Multidetector CT imaging of the head and neck was performed using the standard protocol during bolus administration of intravenous contrast. Multiplanar  CT image reconstructions and MIPs were obtained to evaluate the vascular anatomy. Carotid stenosis measurements (when applicable) are obtained utilizing NASCET criteria, using the distal internal carotid diameter as the denominator. RADIATION DOSE REDUCTION: This exam was performed according to the departmental dose-optimization program which includes automated exposure control, adjustment of the mA and/or kV according to patient size and/or use of iterative reconstruction technique. CONTRAST:  52mL OMNIPAQUE IOHEXOL 350 MG/ML SOLN COMPARISON:  None. FINDINGS: CT HEAD FINDINGS Brain: There is no mass, hemorrhage or extra-axial collection. The size and configuration of the ventricles and extra-axial CSF spaces are normal. There is no acute or chronic infarction. The brain parenchyma is normal. Skull: The visualized skull base, calvarium  and extracranial soft tissues are normal. Sinuses/Orbits: No fluid levels or advanced mucosal thickening of the visualized paranasal sinuses. No mastoid or middle ear effusion. The orbits are normal. CTA NECK FINDINGS SKELETON: There is no bony spinal canal stenosis. No lytic or blastic lesion. OTHER NECK: Normal pharynx, larynx and major salivary glands. No cervical lymphadenopathy. Unremarkable thyroid gland. UPPER CHEST: No pneumothorax or pleural effusion. No nodules or masses. AORTIC ARCH: There is calcific atherosclerosis of the aortic arch. There is no aneurysm, dissection or hemodynamically significant stenosis of the visualized portion of the aorta. Conventional 3 vessel aortic branching pattern. The visualized proximal subclavian arteries are widely patent. RIGHT CAROTID SYSTEM: No dissection, occlusion or aneurysm. Mild atherosclerotic calcification at the carotid bifurcation without hemodynamically significant stenosis. LEFT CAROTID SYSTEM: No dissection, occlusion or aneurysm. Mild atherosclerotic calcification at the carotid bifurcation without hemodynamically  significant stenosis. VERTEBRAL ARTERIES: Left dominant configuration. Both origins are clearly patent. There is no dissection, occlusion or flow-limiting stenosis to the skull base (V1-V3 segments). CTA HEAD FINDINGS POSTERIOR CIRCULATION: --Vertebral arteries: Normal V4 segments. --Inferior cerebellar arteries: Normal. --Basilar artery: Normal. --Superior cerebellar arteries: Normal. --Posterior cerebral arteries (PCA): Normal. ANTERIOR CIRCULATION: --Intracranial internal carotid arteries: Normal. --Anterior cerebral arteries (ACA): Normal. Both A1 segments are present. Patent anterior communicating artery (a-comm). --Middle cerebral arteries (MCA): Normal. VENOUS SINUSES: As permitted by contrast timing, patent. ANATOMIC VARIANTS: None Review of the MIP images confirms the above findings. IMPRESSION: 1. No emergent large vessel occlusion or hemodynamically significant stenosis of the head or neck. Aortic Atherosclerosis (ICD10-I70.0). Electronically Signed   By: Ulyses Jarred M.D.   On: 07/04/2021 23:27   DG Chest 2 View  Result Date: 07/04/2021 CLINICAL DATA:  Altered mental status EXAM: CHEST - 2 VIEW COMPARISON:  01/29/2015 FINDINGS: Transverse diameter of heart is slightly increased. There are no signs of pulmonary edema or focal pulmonary consolidation. There is no pleural effusion or pneumothorax. IMPRESSION: No active cardiopulmonary disease. Electronically Signed   By: Elmer Picker M.D.   On: 07/04/2021 16:54   MR BRAIN WO CONTRAST  Result Date: 07/05/2021 CLINICAL DATA:  Acute neurologic deficits EXAM: MRI HEAD WITHOUT CONTRAST TECHNIQUE: Multiplanar, multiecho pulse sequences of the brain and surrounding structures were obtained without intravenous contrast. COMPARISON:  None. FINDINGS: Motion degraded and truncated examination. Diffusion-weighted imaging shows no acute ischemia. Otherwise limited brain MRI shows a partially empty sella. IMPRESSION: Motion degraded and truncated  examination. No acute ischemia. These results were communicated to Dr. Donnetta Simpers at 7:24 pm on 07/05/2021 by text page via the [facility] messaging system. Electronically Signed   By: Ulyses Jarred M.D.   On: 07/05/2021 19:24   MR BRAIN WO CONTRAST  Result Date: 07/04/2021 CLINICAL DATA:  Neuro deficit, stroke suspected EXAM: MRI HEAD WITHOUT CONTRAST TECHNIQUE: Multiplanar, multiecho pulse sequences of the brain and surrounding structures were obtained without intravenous contrast. COMPARISON:  Noncontrast CT head 06/30/2021 FINDINGS: Brain: There is no evidence of acute intracranial hemorrhage, extra-axial fluid collection, or acute infarct. Background parenchymal volume is normal. The ventricles are normal in size. There is a minimal burden of white matter microangiopathic change. There is no suspicious parenchymal signal abnormality. There is no mass lesion. There is no midline shift. 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 Vascular: Normal flow voids. Skull and upper cervical spine: Normal marrow signal. Sinuses/Orbits: The paranasal sinuses are clear. The globes and orbits are unremarkable. Other: None. IMPRESSION: No acute intracranial pathology. Electronically Signed   By: Valetta Mole  M.D.   On: 07/04/2021 14:57   ECHOCARDIOGRAM COMPLETE  Result Date: 07/06/2021    ECHOCARDIOGRAM REPORT   Patient Name:   Gina Villarreal Date of Exam: 07/06/2021 Medical Rec #:  [id number]     Height:       65.0 in Accession #:    [id number]    Weight:       157.0 lb Date of Birth:  22-Sep-1945     BSA:          1.785 m Patient Age:    9 years      BP:           112/99 mmHg Patient Gender: F             HR:           104 bpm. Exam Location:  Inpatient Procedure: 2D Echo Indications:    Stroke  History:        Patient has prior history of Echocardiogram examinations, most                 recent 03/03/2015. CAD; Risk Factors:Diabetes.  Sonographer:    Jefferey Pica Referring Phys: [id number] [place]  1. Left ventricular ejection fraction, by estimation, is 70 to 75%. The left ventricle has hyperdynamic function. The left ventricle has no regional wall motion abnormalities. There is moderate left ventricular hypertrophy. Left ventricular diastolic parameters are consistent with Grade I diastolic dysfunction (impaired relaxation).  2. Right ventricular systolic function is normal. The right ventricular size is normal.  3. The mitral valve is normal in structure. Trivial mitral valve regurgitation.  4. The aortic valve is normal in structure. Aortic valve regurgitation is not visualized. No aortic stenosis is present. FINDINGS  Left Ventricle: Left ventricular ejection fraction, by estimation, is 70 to 75%. The left ventricle has hyperdynamic function. The left ventricle has no regional wall motion abnormalities. The left ventricular internal cavity size was normal in size. There is moderate left ventricular hypertrophy. Left ventricular diastolic parameters are consistent with Grade I diastolic dysfunction (impaired relaxation). Right Ventricle: The right ventricular size is normal. Right vetricular wall thickness was not well visualized. Right ventricular systolic function is normal. Left Atrium: Left atrial size was normal in size. Right Atrium: Right atrial size was normal in size. Pericardium: There is no evidence of pericardial effusion. Mitral Valve: The mitral valve is normal in structure. Trivial mitral valve regurgitation. Tricuspid Valve: The tricuspid valve is normal in structure. Tricuspid valve regurgitation is trivial. Aortic Valve: The aortic valve is normal in structure. Aortic valve regurgitation is not visualized. No aortic stenosis is present. Aortic valve peak gradient measures 9.4 mmHg. Pulmonic Valve: The pulmonic valve was normal in structure. Pulmonic valve regurgitation is not visualized. Aorta: The aortic root and ascending aorta are structurally normal, with no evidence of  dilitation. IAS/Shunts: The atrial septum is grossly normal.  LEFT VENTRICLE PLAX 2D LVIDd:         3.10 cm   Diastology LVIDs:         1.40 cm   LV e' lateral:   4.73 cm/s LV PW:         1.50 cm   LV E/e' lateral: 13.2 LV IVS:        1.60 cm LVOT diam:     1.90 cm LV SV:         68 LV SV Index:   38 LVOT Area:     2.84 cm  IVC  IVC diam: 1.50 cm LEFT ATRIUM             Index        RIGHT ATRIUM          Index LA diam:        2.50 cm 1.40 cm/m   RA Area:     9.51 cm LA Vol (A2C):   24.6 ml 13.78 ml/m  RA Volume:   17.90 ml 10.03 ml/m LA Vol (A4C):   26.5 ml 14.85 ml/m LA Biplane Vol: 27.3 ml 15.30 ml/m  AORTIC VALVE                 PULMONIC VALVE AV Area (Vmax): 2.36 cm     PV Vmax:       0.94 m/s AV Vmax:        153.50 cm/s  PV Peak grad:  3.6 mmHg AV Peak Grad:   9.4 mmHg LVOT Vmax:      128.00 cm/s LVOT Vmean:     80.700 cm/s LVOT VTI:       0.240 m  AORTA Ao Root diam: 3.00 cm Ao Asc diam:  2.80 cm MITRAL VALVE MV Area (PHT): 2.90 cm     SHUNTS MV Decel Time: 262 msec     Systemic VTI:  0.24 m MV E velocity: 62.60 cm/s   Systemic Diam: 1.90 cm MV A velocity: 118.00 cm/s MV E/A ratio:  0.53 Mertie Moores MD Electronically signed by Mertie Moores MD Signature Date/Time: 07/06/2021/11:24:26 AM    Final    CT HEAD CODE STROKE WO CONTRAST  Result Date: 07/05/2021 CLINICAL DATA:  Code stroke. Acute neuro deficit. Slurred speech facial droop EXAM: CT HEAD WITHOUT CONTRAST TECHNIQUE: Contiguous axial images were obtained from the base of the skull through the vertex without intravenous contrast. RADIATION DOSE REDUCTION: This exam was performed according to the departmental dose-optimization program which includes automated exposure control, adjustment of the mA and/or kV according to patient size and/or use of iterative reconstruction technique. COMPARISON:  CT head 07/04/2021.  MRI head 07/04/2021 FINDINGS: Brain: Ventricle size and cerebral volume normal for age. Negative for acute hemorrhage or mass.  Review of the MRI yesterday reveals a small focus of restricted diffusion in the left middle frontal gyrus likely due to a small acute infarct approximally 3 mm. This is not identified by CT. Vascular: Negative for hyperdense vessel Skull: Negative Sinuses/Orbits: Paranasal sinuses clear.  Negative orbit Other: None ASPECTS ([place] Stroke Program Early CT Score) - Ganglionic level infarction (caudate, lentiform nuclei, internal capsule, insula, M1-M3 cortex): 7 - Supraganglionic infarction (M4-M6 cortex): 3 Total score (0-10 with 10 being normal): 10 IMPRESSION: 1. CT head negative for acute abnormality. 2. ASPECTS is 10 3. Review of MRI yesterday reveals a small 3 mm focus of restricted diffusion left frontal cortex compatible with acute infarct. 4. These results were called by telephone at the time of interpretation on 07/05/2021 at 6:54 pm to provider [facility] , who verbally acknowledged these results. Electronically Signed   By: Franchot Gallo M.D.   On: 07/05/2021 18:55   CT ANGIO HEAD NECK W WO CM (CODE STROKE)  Result Date: 07/05/2021 CLINICAL DATA:  Stroke.  Slurred speech and facial droop. EXAM: CT ANGIOGRAPHY HEAD AND NECK TECHNIQUE: Multidetector CT imaging of the head and neck was performed using the standard protocol during bolus administration of intravenous contrast. Multiplanar CT image reconstructions and MIPs were obtained to evaluate the vascular anatomy. Carotid stenosis measurements (when applicable)  are obtained utilizing NASCET criteria, using the distal internal carotid diameter as the denominator. RADIATION DOSE REDUCTION: This exam was performed according to the departmental dose-optimization program which includes automated exposure control, adjustment of the mA and/or kV according to patient size and/or use of iterative reconstruction technique. CONTRAST:  14mL OMNIPAQUE IOHEXOL 350 MG/ML SOLN COMPARISON:  CT angio head and neck 07/04/2021.  CT head 07/05/2021 FINDINGS: CTA  NECK FINDINGS Aortic arch: Standard branching. Imaged portion shows no evidence of aneurysm or dissection. No significant stenosis of the major arch vessel origins. Mild atherosclerotic aorta. Right carotid system: Mild atherosclerotic disease right carotid bifurcation. Negative for right carotid stenosis or dissection. Left carotid system: Mild atherosclerotic calcification left carotid bifurcation. Negative for stenosis or dissection. Vertebral arteries: Small vertebral arteries bilaterally without stenosis. Skeleton: Mild degenerative change cervical spine. No acute skeletal abnormality. Other neck: Negative for mass or adenopathy in the neck. Upper chest: Lung apices clear bilaterally. Review of the MIP images confirms the above findings CTA HEAD FINDINGS Anterior circulation: Internal carotid artery widely patent through the skull base and cavernous segment. Mild atherosclerotic disease in the cavernous carotid bilaterally. Anterior and middle cerebral arteries widely patent without stenosis or large vessel occlusion. Negative for aneurysm. Posterior circulation: Both vertebral arteries patent to the basilar. PICA patent bilaterally. Basilar patent. Superior cerebellar arteries patent bilaterally. Fetal origin of the posterior cerebral artery bilaterally without stenosis. Venous sinuses: Normal venous enhancement Anatomic variants: None Review of the MIP images confirms the above findings IMPRESSION: 1. No intracranial large vessel occlusion or significant stenosis 2. Mild atherosclerotic disease in the carotid bifurcation bilaterally without stenosis. Both vertebral arteries are patent without stenosis. Electronically Signed   By: Franchot Gallo M.D.   On: 07/05/2021 19:02     LOS: 1 day   Oren Binet, MD  [facility]    To contact the attending provider between 7A-7P or the covering provider during after hours 7P-7A, please log into the web site [email or web address] and access using universal  [place] password for that web site. If you do not have the password, please call the hospital operator.  07/06/2021, 12:49 PM

## 2021-07-06 NOTE — Plan of Care (Signed)
?  Problem: Education: ?Goal: Knowledge of General Education information will improve ?Description: Including pain rating scale, medication(s)/side effects and non-pharmacologic comfort measures ?Outcome: Progressing ?  ?Problem: Health Behavior/Discharge Planning: ?Goal: Ability to manage health-related needs will improve ?Outcome: Progressing ?  ?Problem: Clinical Measurements: ?Goal: Ability to maintain clinical measurements within normal limits will improve ?Outcome: Progressing ?Goal: Will remain free from infection ?Outcome: Progressing ?Goal: Diagnostic test results will improve ?Outcome: Progressing ?Goal: Respiratory complications will improve ?Outcome: Progressing ?Goal: Cardiovascular complication will be avoided ?Outcome: Progressing ?  ?Problem: Activity: ?Goal: Risk for activity intolerance will decrease ?Outcome: Progressing ?  ?Problem: Nutrition: ?Goal: Adequate nutrition will be maintained ?Outcome: Progressing ?  ?Problem: Coping: ?Goal: Level of anxiety will decrease ?Outcome: Progressing ?  ?Problem: Elimination: ?Goal: Will not experience complications related to bowel motility ?Outcome: Progressing ?Goal: Will not experience complications related to urinary retention ?Outcome: Progressing ?  ?Problem: Pain Managment: ?Goal: General experience of comfort will improve ?Outcome: Progressing ?  ?Problem: Safety: ?Goal: Ability to remain free from injury will improve ?Outcome: Progressing ?  ?Problem: Skin Integrity: ?Goal: Risk for impaired skin integrity will decrease ?Outcome: Progressing ?  ?Problem: Education: ?Goal: Knowledge of disease or condition will improve ?Outcome: Progressing ?Goal: Knowledge of secondary prevention will improve (SELECT ALL) ?Outcome: Progressing ?Goal: Knowledge of patient specific risk factors will improve (INDIVIDUALIZE FOR PATIENT) ?Outcome: Progressing ?  ?

## 2021-07-06 NOTE — Progress Notes (Signed)
EEG complete - results pending 

## 2021-07-06 NOTE — Evaluation (Signed)
Speech Language Pathology Evaluation ?Patient Details ?Name: Gina Villarreal ?MRN: [id number] ?DOB: 10/12/1945 ?Today's Date: 07/06/2021 ?Time: 4098-1191 ?SLP Time Calculation (min) (ACUTE ONLY): 20 min ? ?Problem List:  ?Patient Active Problem List  ? Diagnosis Date Noted  ? Intractable headache 07/05/2021  ? Hypomagnesemia 07/04/2021  ? Thrombocytosis 07/04/2021  ? Acute metabolic encephalopathy 47/82/9562  ? Elevated lipase 07/04/2021  ? Leukocytosis 07/04/2021  ? Hypothyroidism 07/04/2021  ? Hypokalemia 07/04/2021  ? Hyponatremia 07/04/2021  ? Dyspnea 03/18/2015  ? Coronary artery disease due to lipid rich plaque 02/12/2015  ? Unstable angina ([place]) 02/01/2015  ? Uncontrolled type 2 diabetes mellitus with hyperglycemia, without long-term current use of insulin ([place]) 02/01/2015  ? Hypertensive urgency 02/01/2015  ? Hyperlipidemia 02/01/2015  ? Rectal cancer ([place]) 12/18/2012  ? ?Past Medical History:  ?Past Medical History:  ?Diagnosis Date  ? Anginal pain ([place])   ? Arthritis   ? "fingers" (02/01/2015)  ? 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 Combined hyperlipidemia   ? Coronary artery disease   ? a. 2003- BMS to RCA b. 01/2015- DES to mid LAD c. DES to mid-OM1  ? GERD (gastroesophageal reflux disease)   ? Hypertension   ? Hypothyroidism   ? Melanoma ([place])   ? "back; left lateral knee"  ? OSA on CPAP   ? Rectal cancer ([place]) 2005  ? Squamous cell carcinoma of right foot   ? Type II diabetes mellitus ([place])   ? ?Past Surgical History:  ?Past Surgical History:  ?Procedure Laterality Date  ? APPENDECTOMY  1960's  ? BREAST CYST ASPIRATION Right 1980's  ? BREAST EXCISIONAL BIOPSY Right pt unsure  ? benign  ? CARDIAC CATHETERIZATION N/A 02/01/2015  ? Procedure: Left Heart Cath and Coronary Angiography;  Surgeon: Peter M Martinique, MD;  Location: [facility];  Service: Cardiovascular;  Laterality: N/A;  ? CARDIAC CATHETERIZATION  02/01/2015  ? Procedure: Coronary Stent Intervention;  Surgeon: Peter M Martinique, MD;  Location: [facility];  Service: Cardiovascular;;  ? COLECTOMY  2005  ? "rectal"  ? CORONARY ANGIOPLASTY WITH STENT PLACEMENT  2003; 2016  ? a. BMS to RCA in 2003. b. DES to mid LAD and DES to mid OM1 on 02/01/2015  ? LAPAROSCOPIC CHOLECYSTECTOMY  1970's  ? MELANOMA EXCISION    ? "back; left lateral knee"  ? RIGHT OOPHORECTOMY Right 1960's  ? SQUAMOUS CELL CARCINOMA EXCISION Right   ? "foot"  ? TONSILLECTOMY  1950's  ? ?HPI:  ?76 y.o. female presenting to ED 2/27 with headaches and confusion. Patient admitted with acute metabolic encephalopathy and hypertensive urgency. L weakness, left facial droop, left sided neglect and left vision deficit on the evening of 2/28. Code stroke was activated. Deficits resolved at time of neuro exam with exception of hemianopsia. Imaging (-) for acute findings. PMHx significant for HTN, HLD, DMII and rectal cancer s/p colostomy.  ? ?Assessment / Plan / Recommendation ?Clinical Impression ? Patient assessed for speech, language and cognitive function by SLP. She did not exhibit any motor speech deficits and language abilities appeared [facility]. SLP assessed patient's cognition via the SLUMS ([place] Mental Status exam) for which she received a score of 17 out of a possible 30. This score placed her in the category of "Dementia" which is for scores 1-20. Patient had difficulty with short term recall, attention during mental calculations and categorical naming but was fully oriented to time/place/situation. Patient's cognitive processing appeared impaired and she did recognize that  her memory wasn't as good now as it typically is. Patient did not show adequate awareness to impact of her deficits on function and need for supervision and assistance. When SLP mentioned how performing tasks that she typically does (financial management, medication management) could be difficult for her now, she did seem to have some realization of need for some assistance. SLP is recommending [place] SLP but  not recommending further skilled SLP treatment at this venue of care. Patient's husband and son in law both state that she will have more than enough support at home from family. ?   ?SLP Assessment ? SLP Recommendation/Assessment: All further Speech Lanaguage Pathology  needs can be addressed in the next venue of care ?SLP Visit Diagnosis: Cognitive communication deficit (R41.841)  ?  ?Recommendations for follow up therapy are one component of a multi-disciplinary discharge planning process, led by the attending physician.  Recommendations may be updated based on patient status, additional functional criteria and insurance authorization. ?   ?Follow Up Recommendations ? Home health SLP  ?  ?Assistance Recommended at Discharge ? Frequent or constant Supervision/Assistance  ?Functional Status Assessment Patient has had a recent decline in their functional status and demonstrates the ability to make significant improvements in function in a reasonable and predictable amount of time.  ?Frequency and Duration    ?  ?  ?   ?SLP Evaluation ?Cognition ? Overall Cognitive Status: Impaired/Different from baseline ?Arousal/Alertness: Awake/alert ?Orientation Level: Oriented to person;Oriented to place;Oriented to time;Oriented to situation;Oriented X4 ?Year: 2023 ?Month: February ?Day of Week: Correct ?Attention: Sustained ?Sustained Attention: Impaired ?Sustained Attention Impairment: Verbal complex ?Memory: Impaired ?Memory Impairment: Retrieval deficit;Storage deficit;Other (comment) (recalled 4 of 5 words after 2.5 minutes) ?Awareness: Impaired ?Awareness Impairment: Emergent impairment ?Problem Solving: Impaired ?Executive Function: Reasoning;Initiating ?Reasoning: Impaired ?Reasoning Impairment: Verbal complex;Verbal basic ?Initiating: Impaired ?Initiating Impairment: Verbal complex ?Safety/Judgment: Impaired ?Comments: Patient reports she thinks she could go home and not need any therapy  ?  ?   ?Comprehension ?  Auditory Comprehension ?Overall Auditory Comprehension: Appears within functional limits for tasks assessed  ?  ?Expression Expression ?Primary Mode of Expression: Verbal ?Verbal Expression ?Overall Verbal Expression: Appears within functional limits for tasks assessed ?Initiation: No impairment ?Pragmatics: Impairment ?Impairments: Abnormal affect;Topic maintenance ?Non-Verbal Means of Communication: Not applicable ?Written Expression ?Dominant Hand: Right ?Written Expression: Within Functional Limits   ?Oral / Motor ? Oral Motor/Sensory Function ?Overall Oral Motor/Sensory Function: Within functional limits ?Motor Speech ?Overall Motor Speech: Appears within functional limits for tasks assessed ?Respiration: Within functional limits ?Resonance: Within functional limits ?Articulation: Within functional limitis ?Intelligibility: Intelligible ?Motor Planning: Witnin functional limits ?Motor Speech Errors: Not applicable ?Interfering Components: Premorbid status ?Effective Techniques: Slow rate   ?        ? ?Sonia Baller, MA, CCC-SLP ?Speech Therapy ? ? ?

## 2021-07-07 LAB — BASIC METABOLIC PANEL
Anion gap: 10 (ref 5–15)
BUN: 11 mg/dL (ref 8–23)
CO2: 28 mmol/L (ref 22–32)
Calcium: 9.1 mg/dL (ref 8.9–10.3)
Chloride: 96 mmol/L — ABNORMAL LOW (ref 98–111)
Creatinine, Ser: 0.94 mg/dL (ref 0.44–1.00)
GFR, Estimated: >60 mL/min (ref >60)
Glucose, Bld: 115 mg/dL — ABNORMAL HIGH (ref 70–99)
Potassium: 3.3 mmol/L — ABNORMAL LOW (ref 3.5–5.1)
Sodium: 134 mmol/L — ABNORMAL LOW (ref 135–145)

## 2021-07-07 LAB — CBC
HCT: 37.6 % (ref 36.0–46.0)
Hemoglobin: 13 g/dL (ref 12.0–15.0)
MCH: 29 pg (ref 26.0–34.0)
MCHC: 34.6 g/dL (ref 30.0–36.0)
MCV: 83.7 fL (ref 80.0–100.0)
Platelets: 374 10*3/uL (ref 150–400)
RBC: 4.49 MIL/uL (ref 3.87–5.11)
RDW: 13 % (ref 11.5–15.5)
WBC: 9.5 10*3/uL (ref 4.0–10.5)
nRBC: 0 % (ref 0.0–0.2)

## 2021-07-07 LAB — GLUCOSE, CAPILLARY
Glucose-Capillary: 110 mg/dL — ABNORMAL HIGH (ref 70–99)
Glucose-Capillary: 132 mg/dL — ABNORMAL HIGH (ref 70–99)
Glucose-Capillary: 164 mg/dL — ABNORMAL HIGH (ref 70–99)
Glucose-Capillary: 192 mg/dL — ABNORMAL HIGH (ref 70–99)
Glucose-Capillary: 193 mg/dL — ABNORMAL HIGH (ref 70–99)
Glucose-Capillary: 271 mg/dL — ABNORMAL HIGH (ref 70–99)

## 2021-07-07 LAB — MAGNESIUM: Magnesium: 1.9 mg/dL (ref 1.7–2.4)

## 2021-07-07 MED ORDER — MAGNESIUM SULFATE 2 GM/50ML IV SOLN
2.0000 g | Freq: Once | INTRAVENOUS | Status: AC
  Administered 2021-07-07: 2 g via INTRAVENOUS
  Filled 2021-07-07: qty 50

## 2021-07-07 MED ORDER — POTASSIUM CHLORIDE CRYS ER 20 MEQ PO TBCR
40.0000 meq | EXTENDED_RELEASE_TABLET | ORAL | Status: AC
  Administered 2021-07-07 (×2): 40 meq via ORAL
  Filled 2021-07-07 (×2): qty 2

## 2021-07-07 NOTE — Progress Notes (Signed)
PROGRESS NOTE        PATIENT DETAILS Name: Gina Villarreal Age: 76 y.o. Sex: female Date of Birth: [birth year] Admit Date: 07/04/2021 Admitting Physician Norval Morton, MD [id number], Gwyndolyn Saxon, MD  Brief Summary: Patient is a 76 y.o.  female with history of HTN, HLD, DM-2, CAD, colon cancer-s/p ostomy-presented to the hospital with 2-3-week history of headache, intermittent diplopia-and confusion (recently started on oxycodone for headache).  She was subsequently admitted to the hospitalist service, posthospitalization-she developed transient left-sided weakness after a sneezing spell.  See below for further details.  Significant Hospital events: 2/27>> admit for evaluation of worsening frontal headaches, diplopia and confusion (recently started on oxycodone) 2/28>> sneezing spell-left-sided weakness-code stroke.  Significant studies: 2/27>> CXR: No PNA 2/27>> MRI brain: 3 mm cortical infarct per neurology note (official reading is negative for CVA) 2/27>> CT angio head/neck: No LVO or hemodynamically significant stenosis. 2/28>> CT head code stroke: No acute intracranial abnormality 2/28>> CT angio head/neck code stroke: No LVO 2/28>> MRI brain: No acute ischemia 3/01>> LDL 36 3/01>> A1c: 6.9 3/01>> Echo: EF 70-75% 3/01>> EEG: No seizures.  Significant microbiology data: 2/27>> COVID/influenza PCR: Negative  Procedures: None  Consults: Neurology  Subjective: No headaches.  Answers simple questions appropriately-daughter reports that she is still slow-and still having some intermittent confusion-and is not yet back to baseline.  Objective: Vitals: Blood pressure (!) 147/77, pulse (!) 105, temperature (!) 97.4 F (36.3 C), temperature source Oral, resp. rate 14, height 5\' 5"  (1.651 m), weight 71.2 kg, SpO2 96 %.   Exam: Gen Exam:Alert awake-not in any distress HEENT:atraumatic, normocephalic Chest: B/L clear to auscultation anteriorly CVS:S1S2  regular Abdomen:soft non tender, non distended Extremities:no edema Neurology: Non focal Skin: no rash   Pertinent Labs/Radiology: CBC Latest Ref Rng & Units 07/07/2021 07/06/2021 07/05/2021  WBC 4.0 - 10.5 K/uL 9.5 15.5(H) -  Hemoglobin 12.0 - 15.0 g/dL 13.0 14.6 13.3  Hematocrit 36.0 - 46.0 % 37.6 42.2 39.0  Platelets 150 - 400 K/uL 374 373 -    Lab Results  Component Value Date   NA 134 (L) 07/07/2021   K 3.3 (L) 07/07/2021   CL 96 (L) 07/07/2021   CO2 28 07/07/2021      Assessment/Plan: Acute toxic encephalopathy: Suspect encephalopathy on admission was likely due to recent introduction of narcotic use for headaches.  Encephalopathy likely worsened due to use of lorazepam/Reglan/Phenergan/narcotics during the earlier part of this hospital stay.  Thankfully encephalopathy has now improved-although improved-she is not yet back to baseline-still with some lingering confusion.  Given her age-it may just take some time for her to washout numerous benzos/narcotics that she got earlier.  Ambulate-maintain delirium precautions-and continue to watch closely.  If confusion/encephalopathy does not clear-we will reconsult neurology.  Intractable headache: Likely migraine headaches-resolved-after treatment with IV Toradol Phenergan/Reglan.  No further headaches for the past 2 days.   If headaches recur-we will try IV Depakote.   Transient left-sided facial droop/blurry vision/double vision:?  Secondary to complicated migraine.  Unclear whether acute stroke on MRI is an incidental finding.  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     CAD-s/p PCI to RCA 2003, PCI to LAD in 2016: Continue antiplatelet/statin.  Currently with no anginal symptoms.  Hypokalemia: Continue to replete and recheck.  Hyponatremia: Mild-of no clinical significance.  Leukocytosis: Has resolved-was likely  reactive.    HTN: BP stable-allow permissive hypertension  HLD: Continue statin/Zetia  DM-2: Continue SSI  Recent Labs    07/07/21 0414 07/07/21 0744 07/07/21 1215  GLUCAP 110* 192* 193*     Hypothyroidism: Continue with Synthroid.  TSH slightly elevated-unclear if her dosage of Synthroid was recently adjusted-stable for outpatient follow-up with PCP.  History of rectal cancer-s/p colectomy in 2005: Continue with ostomy care  BMI: Estimated body mass index is 26.12 kg/m as calculated from the following:   Height as of this encounter: 5\' 5"  (1.651 m).   Weight as of this encounter: 71.2 kg.   Code status:   Code Status: Full Code   DVT Prophylaxis: enoxaparin (LOVENOX) injection 40 mg Start: 07/06/21 1400   Family Communication: Daughter at bedside   Disposition Plan: Status is: Inpatient Remains inpatient appropriate because: Continues to have lingering encephalopathy-not yet at baseline-watch 1 another day-stroke work-up probable home on 3/3.    Planned Discharge Destination:Home   Diet: Diet Order             Diet heart healthy/carb modified Room service appropriate? Yes; Fluid consistency: Thin  Diet effective now                     Antimicrobial agents: Anti-infectives (From admission, onward)    None        MEDICATIONS: Scheduled Meds:   stroke: mapping our early stages of recovery book   Does not apply Once   aspirin  81 mg Oral Daily   clopidogrel  75 mg Oral Daily   enoxaparin (LOVENOX) injection  40 mg Subcutaneous Q24H   ezetimibe  10 mg Oral Daily   insulin aspart  0-9 Units Subcutaneous Q4H   levothyroxine  112 mcg Oral Q0600   potassium chloride  40 mEq Oral Q4H   pravastatin  40 mg Oral BID   sodium chloride flush  3 mL Intravenous Q12H   Continuous Infusions: PRN Meds:.acetaminophen **OR** acetaminophen, albuterol, alum & mag hydroxide-simeth, haloperidol lactate, hydrALAZINE, ketorolac, ondansetron **OR** ondansetron  (ZOFRAN) IV   I have personally reviewed following labs and imaging studies  LABORATORY DATA: CBC: Recent Labs  Lab 07/04/21 1131 07/05/21 0254 07/05/21 0303 07/06/21 0420 07/07/21 0417  WBC 11.4* 13.2*  --  15.5* 9.5  NEUTROABS 9.4*  --   --   --   --   HGB 14.0 13.5 13.3 14.6 13.0  HCT 41.1 38.3 39.0 42.2 37.6  MCV 84.6 82.9  --  83.9 83.7  PLT 428* 386  --  373 374     Basic Metabolic Panel: Recent Labs  Lab 07/04/21 1131 07/05/21 0254 07/05/21 0303 07/06/21 0420 07/07/21 0417  NA 131* 130* 130* 132* 134*  K 3.4* 3.0* 3.1* 3.2* 3.3*  CL 89* 91*  --  95* 96*  CO2 26 27  --  21* 28  GLUCOSE 295* 92  --  158* 115*  BUN 15 13  --  11 11  CREATININE 0.92 0.91  --  0.97 0.94  CALCIUM 9.4 9.2  --  9.0 9.1  MG 1.2* 1.9  --  1.6* 1.9     GFR: Estimated Creatinine Clearance: 51.2 mL/min (by C-G formula based on SCr of 0.94 mg/dL).  Liver Function Tests: Recent Labs  Lab 07/05/21 0950  AST 18  ALT 17  ALKPHOS 57  BILITOT 1.1  PROT 6.5  ALBUMIN 3.7  Recent Labs  Lab 07/05/21 0950  LIPASE 37    No results for input(s): AMMONIA in the last 168 hours.  Coagulation Profile: No results for input(s): INR, PROTIME in the last 168 hours.  Cardiac Enzymes: No results for input(s): CKTOTAL, CKMB, CKMBINDEX, TROPONINI in the last 168 hours.  BNP (last 3 results) No results for input(s): PROBNP in the last 8760 hours.  Lipid Profile: Recent Labs    07/06/21 0420  CHOL 121  HDL 54  LDLCALC 36  TRIG 156*  CHOLHDL 2.2     Thyroid Function Tests: Recent Labs    07/05/21 0950 07/05/21 1406  TSH 8.872*  --   FREET4  --  1.31*     Anemia Panel: No results for input(s): VITAMINB12, FOLATE, FERRITIN, TIBC, IRON, RETICCTPCT in the last 72 hours.  Urine analysis:    Component Value Date/Time   COLORURINE YELLOW 07/04/2021 1610   APPEARANCEUR CLEAR 07/04/2021 1610   LABSPEC 1.012 07/04/2021 1610   PHURINE 6.0 07/04/2021 1610   GLUCOSEU >=500  (A) 07/04/2021 1610   HGBUR LARGE (A) 07/04/2021 1610   BILIRUBINUR NEGATIVE 07/04/2021 1610   KETONESUR 20 (A) 07/04/2021 1610   PROTEINUR 100 (A) 07/04/2021 1610   UROBILINOGEN 0.2 10/31/2009 2059   NITRITE NEGATIVE 07/04/2021 1610   LEUKOCYTESUR NEGATIVE 07/04/2021 1610    Sepsis Labs: Lactic Acid, Venous    Component Value Date/Time   LATICACIDVEN 1.8 11/01/2009 0510    MICROBIOLOGY: Recent Results (from the past 240 hour(s))  Resp Panel by RT-PCR (Flu A&B, Covid) Nasopharyngeal Swab     Status: None   Collection Time: 06/30/21  9:35 AM   Specimen: Nasopharyngeal Swab; Nasopharyngeal(NP) swabs in vial transport medium  Result Value Ref Range Status   SARS Coronavirus 2 by RT PCR NEGATIVE NEGATIVE Final    Comment: (NOTE) SARS-CoV-2 target nucleic acids are NOT DETECTED.  The SARS-CoV-2 RNA is generally detectable in upper respiratory specimens during the acute phase of infection. The lowest concentration of SARS-CoV-2 viral copies this assay can detect is 138 copies/mL. A negative result does not preclude SARS-Cov-2 infection and should not be used as the sole basis for treatment or other patient management decisions. A negative result may occur with  improper specimen collection/handling, submission of specimen other than nasopharyngeal swab, presence of viral mutation(s) within the areas targeted by this assay, and inadequate number of viral copies(<138 copies/mL). A negative result must be combined with clinical observations, patient history, and epidemiological information. The expected result is Negative.  Fact Sheet for Patients:  EntrepreneurPulse.com.au  Fact Sheet for Healthcare Providers:  IncredibleEmployment.be  This test is no t yet approved or cleared by the [place] FDA and  has been authorized for detection and/or diagnosis of SARS-CoV-2 by FDA under an Emergency Use Authorization (EUA). This EUA will remain   in effect (meaning this test can be used) for the duration of the COVID-19 declaration under Section 564(b)(1) of the Act, 21 U.S.C.section 360bbb-3(b)(1), unless the authorization is terminated  or revoked sooner.       Influenza A by PCR NEGATIVE NEGATIVE Final   Influenza B by PCR NEGATIVE NEGATIVE Final    Comment: (NOTE) The Xpert Xpress SARS-CoV-2/FLU/RSV plus assay is intended as an aid in the diagnosis of influenza from Nasopharyngeal swab specimens and should not be used as a sole basis for treatment. Nasal washings and aspirates are unacceptable for Xpert Xpress SARS-CoV-2/FLU/RSV testing.  Fact Sheet for Patients: EntrepreneurPulse.com.au  Fact Sheet for Healthcare Providers:  IncredibleEmployment.be  This test is not yet approved or cleared by the [place] and has been authorized for detection and/or diagnosis of SARS-CoV-2 by FDA under an Emergency Use Authorization (EUA). This EUA will remain in effect (meaning this test can be used) for the duration of the COVID-19 declaration under Section 564(b)(1) of the Act, 21 U.S.C. section 360bbb-3(b)(1), unless the authorization is terminated or revoked.  Performed at [facility], [address] [place], [place] [address]   Resp Panel by RT-PCR (Flu A&B, Covid) Nasopharyngeal Swab     Status: None   Collection Time: 07/04/21  1:34 PM   Specimen: Nasopharyngeal Swab; Nasopharyngeal(NP) swabs in vial transport medium  Result Value Ref Range Status   SARS Coronavirus 2 by RT PCR NEGATIVE NEGATIVE Final    Comment: (NOTE) SARS-CoV-2 target nucleic acids are NOT DETECTED.  The SARS-CoV-2 RNA is generally detectable in upper respiratory specimens during the acute phase of infection. The lowest concentration of SARS-CoV-2 viral copies this assay can detect is 138 copies/mL. A negative result does not preclude SARS-Cov-2 infection and should not be used as  the sole basis for treatment or other patient management decisions. A negative result may occur with  improper specimen collection/handling, submission of specimen other than nasopharyngeal swab, presence of viral mutation(s) within the areas targeted by this assay, and inadequate number of viral copies(<138 copies/mL). A negative result must be combined with clinical observations, patient history, and epidemiological information. The expected result is Negative.  Fact Sheet for Patients:  EntrepreneurPulse.com.au  Fact Sheet for Healthcare Providers:  IncredibleEmployment.be  This test is no t yet approved or cleared by the [place] FDA and  has been authorized for detection and/or diagnosis of SARS-CoV-2 by FDA under an Emergency Use Authorization (EUA). This EUA will remain  in effect (meaning this test can be used) for the duration of the COVID-19 declaration under Section 564(b)(1) of the Act, 21 U.S.C.section 360bbb-3(b)(1), unless the authorization is terminated  or revoked sooner.       Influenza A by PCR NEGATIVE NEGATIVE Final   Influenza B by PCR NEGATIVE NEGATIVE Final    Comment: (NOTE) The Xpert Xpress SARS-CoV-2/FLU/RSV plus assay is intended as an aid in the diagnosis of influenza from Nasopharyngeal swab specimens and should not be used as a sole basis for treatment. Nasal washings and aspirates are unacceptable for Xpert Xpress SARS-CoV-2/FLU/RSV testing.  Fact Sheet for Patients: EntrepreneurPulse.com.au  Fact Sheet for Healthcare Providers: IncredibleEmployment.be  This test is not yet approved or cleared by the [place] FDA and has been authorized for detection and/or diagnosis of SARS-CoV-2 by FDA under an Emergency Use Authorization (EUA). This EUA will remain in effect (meaning this test can be used) for the duration of the COVID-19 declaration under Section 564(b)(1) of  the Act, 21 U.S.C. section 360bbb-3(b)(1), unless the authorization is terminated or revoked.  Performed at [facility], [place] [address], [place], [place] [address]     RADIOLOGY STUDIES/RESULTS: MR BRAIN WO CONTRAST  Result Date: 07/05/2021 CLINICAL DATA:  Acute neurologic deficits EXAM: MRI HEAD WITHOUT CONTRAST TECHNIQUE: Multiplanar, multiecho pulse sequences of the brain and surrounding structures were obtained without intravenous contrast. COMPARISON:  None. FINDINGS: Motion degraded and truncated examination. Diffusion-weighted imaging shows no acute ischemia. Otherwise limited brain MRI shows a partially empty sella. IMPRESSION: Motion degraded and truncated examination. No acute ischemia. These results were communicated to Dr. Donnetta Simpers at 7:24 pm on 07/05/2021 by text page via  the [employer] system. Electronically Signed   By: Ulyses Jarred M.D.   On: 07/05/2021 19:24   EEG adult  Result Date: 07/06/2021 Lora Havens, MD     07/06/2021  3:40 PM Patient Name: Gina Villarreal MRN: [id number] Epilepsy Attending: Lora Havens Referring Physician/Provider: Donnetta Simpers Date: 07/06/2021 Duration: 22.34 mins Patient history: 76 y.o. female stroke risk factors including diabetes, hypertension, hyperlipidemia and rectal cancer status post colostomy who was admitted with confusion and headache.  EEG to evaluate for seizure Level of alertness: Awake,asleep AEDs during EEG study: None Technical aspects: This EEG study was done with scalp electrodes positioned according to the 10-20 International system of electrode placement. Electrical activity was acquired at a sampling rate of 500Hz  and reviewed with a high frequency filter of 70Hz  and a low frequency filter of 1Hz . EEG data were recorded continuously and digitally stored. Description: The posterior dominant rhythm consists of 9 Hz activity of moderate voltage (25-35 uV) seen predominantly in posterior head regions,  symmetric and reactive to eye opening and eye closing. Sleep was characterized by vertex waves, sleep spindles (12 to 14 Hz), maximal frontocentral region. Hyperventilation and photic stimulation were not performed.   IMPRESSION: This study is within normal limits. No seizures or epileptiform discharges were seen throughout the recording. Lora Havens   ECHOCARDIOGRAM COMPLETE  Result Date: 07/06/2021    ECHOCARDIOGRAM REPORT   Patient Name:   Gina Villarreal Date of Exam: 07/06/2021 Medical Rec #:  [id number]     Height:       65.0 in Accession #:    [id number]    Weight:       157.0 lb Date of Birth:  01-Oct-1945     BSA:          1.785 m Patient Age:    50 years      BP:           112/99 mmHg Patient Gender: F             HR:           104 bpm. Exam Location:  Inpatient Procedure: 2D Echo Indications:    Stroke  History:        Patient has prior history of Echocardiogram examinations, most                 recent 03/03/2015. CAD; Risk Factors:Diabetes.  Sonographer:    Jefferey Pica Referring Phys: [id number] [place]  1. Left ventricular ejection fraction, by estimation, is 70 to 75%. The left ventricle has hyperdynamic function. The left ventricle has no regional wall motion abnormalities. There is moderate left ventricular hypertrophy. Left ventricular diastolic parameters are consistent with Grade I diastolic dysfunction (impaired relaxation).  2. Right ventricular systolic function is normal. The right ventricular size is normal.  3. The mitral valve is normal in structure. Trivial mitral valve regurgitation.  4. The aortic valve is normal in structure. Aortic valve regurgitation is not visualized. No aortic stenosis is present. FINDINGS  Left Ventricle: Left ventricular ejection fraction, by estimation, is 70 to 75%. The left ventricle has hyperdynamic function. The left ventricle has no regional wall motion abnormalities. The left ventricular internal cavity size was normal in size.  There is moderate left ventricular hypertrophy. Left ventricular diastolic parameters are consistent with Grade I diastolic dysfunction (impaired relaxation). Right Ventricle: The right ventricular size is normal. Right vetricular wall thickness was not well visualized. Right ventricular systolic function is  normal. Left Atrium: Left atrial size was normal in size. Right Atrium: Right atrial size was normal in size. Pericardium: There is no evidence of pericardial effusion. Mitral Valve: The mitral valve is normal in structure. Trivial mitral valve regurgitation. Tricuspid Valve: The tricuspid valve is normal in structure. Tricuspid valve regurgitation is trivial. Aortic Valve: The aortic valve is normal in structure. Aortic valve regurgitation is not visualized. No aortic stenosis is present. Aortic valve peak gradient measures 9.4 mmHg. Pulmonic Valve: The pulmonic valve was normal in structure. Pulmonic valve regurgitation is not visualized. Aorta: The aortic root and ascending aorta are structurally normal, with no evidence of dilitation. IAS/Shunts: The atrial septum is grossly normal.  LEFT VENTRICLE PLAX 2D LVIDd:         3.10 cm   Diastology LVIDs:         1.40 cm   LV e' lateral:   4.73 cm/s LV PW:         1.50 cm   LV E/e' lateral: 13.2 LV IVS:        1.60 cm LVOT diam:     1.90 cm LV SV:         68 LV SV Index:   38 LVOT Area:     2.84 cm  IVC IVC diam: 1.50 cm LEFT ATRIUM             Index        RIGHT ATRIUM          Index LA diam:        2.50 cm 1.40 cm/m   RA Area:     9.51 cm LA Vol (A2C):   24.6 ml 13.78 ml/m  RA Volume:   17.90 ml 10.03 ml/m LA Vol (A4C):   26.5 ml 14.85 ml/m LA Biplane Vol: 27.3 ml 15.30 ml/m  AORTIC VALVE                 PULMONIC VALVE AV Area (Vmax): 2.36 cm     PV Vmax:       0.94 m/s AV Vmax:        153.50 cm/s  PV Peak grad:  3.6 mmHg AV Peak Grad:   9.4 mmHg LVOT Vmax:      128.00 cm/s LVOT Vmean:     80.700 cm/s LVOT VTI:       0.240 m  AORTA Ao Root diam: 3.00 cm  Ao Asc diam:  2.80 cm MITRAL VALVE MV Area (PHT): 2.90 cm     SHUNTS MV Decel Time: 262 msec     Systemic VTI:  0.24 m MV E velocity: 62.60 cm/s   Systemic Diam: 1.90 cm MV A velocity: 118.00 cm/s MV E/A ratio:  0.53 Mertie Moores MD Electronically signed by Mertie Moores MD Signature Date/Time: 07/06/2021/11:24:26 AM    Final    CT HEAD CODE STROKE WO CONTRAST  Result Date: 07/05/2021 CLINICAL DATA:  Code stroke. Acute neuro deficit. Slurred speech facial droop EXAM: CT HEAD WITHOUT CONTRAST TECHNIQUE: Contiguous axial images were obtained from the base of the skull through the vertex without intravenous contrast. RADIATION DOSE REDUCTION: This exam was performed according to the departmental dose-optimization program which includes automated exposure control, adjustment of the mA and/or kV according to patient size and/or use of iterative reconstruction technique. COMPARISON:  CT head 07/04/2021.  MRI head 07/04/2021 FINDINGS: Brain: Ventricle size and cerebral volume normal for age. Negative for acute hemorrhage or mass. Review of the MRI yesterday reveals a  small focus of restricted diffusion in the left middle frontal gyrus likely due to a small acute infarct approximally 3 mm. This is not identified by CT. Vascular: Negative for hyperdense vessel Skull: Negative Sinuses/Orbits: Paranasal sinuses clear.  Negative orbit Other: None ASPECTS ([place] Stroke Program Early CT Score) - Ganglionic level infarction (caudate, lentiform nuclei, internal capsule, insula, M1-M3 cortex): 7 - Supraganglionic infarction (M4-M6 cortex): 3 Total score (0-10 with 10 being normal): 10 IMPRESSION: 1. CT head negative for acute abnormality. 2. ASPECTS is 10 3. Review of MRI yesterday reveals a small 3 mm focus of restricted diffusion left frontal cortex compatible with acute infarct. 4. These results were called by telephone at the time of interpretation on 07/05/2021 at 6:54 pm to provider [facility] , who verbally  acknowledged these results. Electronically Signed   By: Franchot Gallo M.D.   On: 07/05/2021 18:55   CT ANGIO HEAD NECK W WO CM (CODE STROKE)  Result Date: 07/05/2021 CLINICAL DATA:  Stroke.  Slurred speech and facial droop. EXAM: CT ANGIOGRAPHY HEAD AND NECK TECHNIQUE: Multidetector CT imaging of the head and neck was performed using the standard protocol during bolus administration of intravenous contrast. Multiplanar CT image reconstructions and MIPs were obtained to evaluate the vascular anatomy. Carotid stenosis measurements (when applicable) are obtained utilizing NASCET criteria, using the distal internal carotid diameter as the denominator. RADIATION DOSE REDUCTION: This exam was performed according to the departmental dose-optimization program which includes automated exposure control, adjustment of the mA and/or kV according to patient size and/or use of iterative reconstruction technique. CONTRAST:  54mL OMNIPAQUE IOHEXOL 350 MG/ML SOLN COMPARISON:  CT angio head and neck 07/04/2021.  CT head 07/05/2021 FINDINGS: CTA NECK FINDINGS Aortic arch: Standard branching. Imaged portion shows no evidence of aneurysm or dissection. No significant stenosis of the major arch vessel origins. Mild atherosclerotic aorta. Right carotid system: Mild atherosclerotic disease right carotid bifurcation. Negative for right carotid stenosis or dissection. Left carotid system: Mild atherosclerotic calcification left carotid bifurcation. Negative for stenosis or dissection. Vertebral arteries: Small vertebral arteries bilaterally without stenosis. Skeleton: Mild degenerative change cervical spine. No acute skeletal abnormality. Other neck: Negative for mass or adenopathy in the neck. Upper chest: Lung apices clear bilaterally. Review of the MIP images confirms the above findings CTA HEAD FINDINGS Anterior circulation: Internal carotid artery widely patent through the skull base and cavernous segment. Mild atherosclerotic  disease in the cavernous carotid bilaterally. Anterior and middle cerebral arteries widely patent without stenosis or large vessel occlusion. Negative for aneurysm. Posterior circulation: Both vertebral arteries patent to the basilar. PICA patent bilaterally. Basilar patent. Superior cerebellar arteries patent bilaterally. Fetal origin of the posterior cerebral artery bilaterally without stenosis. Venous sinuses: Normal venous enhancement Anatomic variants: None Review of the MIP images confirms the above findings IMPRESSION: 1. No intracranial large vessel occlusion or significant stenosis 2. Mild atherosclerotic disease in the carotid bifurcation bilaterally without stenosis. Both vertebral arteries are patent without stenosis. Electronically Signed   By: Franchot Gallo M.D.   On: 07/05/2021 19:02     LOS: 2 days   Oren Binet, MD  [facility]    To contact the attending provider between 7A-7P or the covering provider during after hours 7P-7A, please log into the web site [email or web address] and access using universal [place] password for that web site. If you do not have the password, please call the hospital operator.  07/07/2021, 12:23 PM

## 2021-07-07 NOTE — Plan of Care (Signed)
  Problem: Education: Goal: Knowledge of disease or condition will improve Outcome: Progressing   

## 2021-07-07 NOTE — TOC Initial Note (Signed)
Transition of Care (TOC) - Initial/Assessment Note  ? ? ?Patient Details  ?Name: Gina Villarreal ?MRN: [id number] ?Date of Birth: 06/19/1945 ? ?Transition of Care (TOC) CM/SW Contact:    ?Verdell Carmine, RN ?Phone Number: ?07/07/2021, 1:22 PM ? ?Clinical Narrative:                 ? 76 YO admitted with Strokelike symptoms. Only current deficit is speech slowness, daughter feels she is not back to baseline. Would like a agency for home health that works best with insurance.  ?[place] secured with [facility] for SLP, PT OT. Denies need for DME at this time. Feels she is ambulating well.  ?CM will follow for further needs, [facility] can do start date of Tuesday and will call for set up ?CM will follow for needs further recommendations, and transitions.  ? ?Expected Discharge Plan: [place] ?Barriers to Discharge: Continued Medical Work up ? ? ?Patient Goals and CMS Choice ?  ?  ? Home with home health ? ?Expected Discharge Plan and Services ?Expected Discharge Plan: [place] ?  ?  ?Post Acute Care Choice: Durable Medical Equipment, Home Health ?Living arrangements for the past 2 months: [place] ?                ?DME Arranged:  (denied need for DME at this time) ?  ?  ?  ?  ?HH Arranged: PT, OT, Speech Therapy ?[place] Agency: [facility] ?Date HH Agency Contacted: 07/07/21 ?Time [place]: 4665 ?Representative spoke with at [place]: [place] ? ?Prior Living Arrangements/Services ?Living arrangements for the past 2 months: [place] ?  ?Patient language and need for interpreter reviewed:: Yes ?       ?Need for Family Participation in Patient Care: Yes (Comment) ?Care giver support system in place?: Yes (comment) ?  ?Criminal Activity/Legal Involvement Pertinent to Current Situation/Hospitalization: No - Comment as needed ? ?Activities of Daily Living ?Home Assistive Devices/Equipment: Dentures (specify type), Eyeglasses (partial) ?ADL Screening (condition at  time of admission) ?Patient's cognitive ability adequate to safely complete daily activities?: No ?Is the patient deaf or have difficulty hearing?: No ?Does the patient have difficulty seeing, even when wearing glasses/contacts?: No ?Does the patient have difficulty concentrating, remembering, or making decisions?: Yes ?Patient able to express need for assistance with ADLs?: Yes ?Does the patient have difficulty dressing or bathing?: Yes ?Independently performs ADLs?: No ?Communication: Independent ?Dressing (OT): Needs assistance ?Is this a change from baseline?: Change from baseline, expected to last <3days ?Grooming: Needs assistance ?Is this a change from baseline?: Change from baseline, expected to last <3 days ?Feeding: Independent ?Bathing: Needs assistance ?Is this a change from baseline?: Change from baseline, expected to last <3 days ?Toileting: Needs assistance ?Is this a change from baseline?: Change from baseline, expected to last <3 days ?In/Out Bed: Needs assistance ?Is this a change from baseline?: Change from baseline, expected to last <3 days ?Walks in Home: Independent ?Does the patient have difficulty walking or climbing stairs?: No ?Weakness of Legs: None ?Weakness of Arms/Hands: None ? ?Permission Sought/Granted ?  ?  ?   ?   ?   ?   ? ?Emotional Assessment ?  ?  ?  ?Orientation: : Oriented to Self, Oriented to Place, Oriented to  Time ?Alcohol / Substance Use: Not Applicable ?Psych Involvement: No (comment) ? ?Admission diagnosis:  Metabolic encephalopathy [L93.57] ?Dehydration [E86.0] ?Hypomagnesemia [E83.42] ?Acute nonintractable headache,  unspecified headache type [R51.9] ?Acute metabolic encephalopathy [R83.81] ?AMS (altered mental status) [R41.82] ?Patient Active Problem List  ? Diagnosis Date Noted  ? Intractable headache 07/05/2021  ? Hypomagnesemia 07/04/2021  ? Thrombocytosis 07/04/2021  ? Acute metabolic encephalopathy 84/07/7541  ? Elevated lipase 07/04/2021  ? Leukocytosis  07/04/2021  ? Hypothyroidism 07/04/2021  ? Hypokalemia 07/04/2021  ? Hyponatremia 07/04/2021  ? Dyspnea 03/18/2015  ? Coronary artery disease due to lipid rich plaque 02/12/2015  ? Unstable angina ([place]) 02/01/2015  ? Uncontrolled type 2 diabetes mellitus with hyperglycemia, without long-term current use of insulin ([place]) 02/01/2015  ? Hypertensive urgency 02/01/2015  ? Hyperlipidemia 02/01/2015  ? Rectal cancer ([place]) 12/18/2012  ? ?PCP:  Shirline Frees, MD ?Pharmacy:   ?[id number] [place] [place] [address] ?Phone: [phone] Fax: [phone] ? ?[facility] [place], [place] ?[employer] ?[place] [address] ?Phone: [phone] Fax: [phone] ? ?[facility] [id number] [place], [place] [address] ?[place] [address] ?Phone: [phone] Fax: [phone] ? ?[facility] [place] ?[place] ?[place] [address] ?Phone: [phone] Fax: [phone] ? ? ? ? ?Social Determinants of Health (SDOH) Interventions ?  ? ?Readmission Risk Interventions ?No flowsheet data found. ? ? ?

## 2021-07-07 NOTE — Progress Notes (Signed)
Physical Therapy Treatment ?Patient Details ?Name: Gina Villarreal ?MRN: [id number] ?DOB: [birth year] ?Today's Date: 07/07/2021 ? ? ?History of Present Illness 76 y.o. female presenting to ED 2/27 with headaches and confusion. Patient admitted with acute metabolic encephalopathy and hypertensive urgency. L weakness, left facial droop, left sided neglect and left vision deficit on the evening of 2/28. Code stroke was activated. Deficits resolved at time of neuro exam with exception of hemianopsia. Imaging (-) for acute findings. PMHx significant for HTN, HLD, DMII and rectal cancer s/p colostomy. ? ?  ?PT Comments  ? ? Pt admitted with above diagnosis. Pt was able to ambulate with assist with challenges. Pt continues to struggle at times with postural stability when she first gets to EOB and still appears to have a left inattention.   HR 115 - 132 bpm with activity. Daughter and pt are aware of deficits.  Pt currently with functional limitations due to balance and endurance deficits. Pt will benefit from skilled PT to increase their independence and safety with mobility to allow discharge to the venue listed below.      ?Recommendations for follow up therapy are one component of a multi-disciplinary discharge planning process, led by the attending physician.  Recommendations may be updated based on patient status, additional functional criteria and insurance authorization. ? ?Follow Up Recommendations ? Home health PT ?  ?  ?Assistance Recommended at Discharge Set up Supervision/Assistance  ?Patient can return home with the following A little help with walking and/or transfers;A little help with bathing/dressing/bathroom ?  ?Equipment Recommendations ? Other (comment) (TBA, possible RW)  ?  ?Recommendations for Other Services   ? ? ?  ?Precautions / Restrictions Precautions ?Precautions: Fall ?Precaution Comments: monitor HR; colosotmy ?Restrictions ?Weight Bearing Restrictions: No  ?  ? ?Mobility ? Bed Mobility ?Overal bed  mobility: Needs Assistance ?Bed Mobility: Supine to Sit ?  ?  ?Supine to sit: Min assist, Mod assist ?  ?  ?General bed mobility comments: Upon sitting, pt with left lateral lean initially and took incr time for pt to be aware that she was leaning to left. Pt had to be cued and assisted and then she was able to achieve full upright sitting. ?  ? ?Transfers ?Overall transfer level: Needs assistance ?Equipment used: None ?Transfers: Sit to/from Stand ?Sit to Stand: Min guard ?  ?  ?  ?  ?  ?General transfer comment: Min guard A for sit to stand from EOB. Cues for safe hand placement. ?  ? ?Ambulation/Gait ?Ambulation/Gait assistance: Min guard, Min assist ?Gait Distance (Feet): 350 Feet ?Assistive device: None, 1 person hand held assist ?Gait Pattern/deviations: Step-through pattern, Decreased stride length, Drifts right/left ?  ?Gait velocity interpretation: <1.31 ft/sec, indicative of household ambulator ?  ?General Gait Details: Pt was able to progress ambulation to hallway. once pt ambulated further, she appeared to not run into objects/get close to objects in hallway on left side.    Pt did still veer to left as well unless cued. Pt still with occasional LOB to left with mod challenges however pt was able to self correct. ? ? ?Stairs ?  ?  ?  ?  ?  ? ? ?Wheelchair Mobility ?  ? ?Modified Rankin (Stroke Patients Only) ?Modified Rankin (Stroke Patients Only) ?Pre-Morbid Rankin Score: No symptoms ?Modified Rankin: Moderately severe disability ? ? ?  ?Balance Overall balance assessment: Needs assistance ?Sitting-balance support: Bilateral upper extremity supported, Feet supported ?Sitting balance-Leahy Scale: Poor ?Sitting balance - Comments: Min A  initially to maintain static sitting balance at EOB. Took pt incr time to attain balance at EOB. Min to mod A with dynamic sitting balance. ?Postural control: Left lateral lean, Posterior lean ?Standing balance support: Single extremity supported, During functional  activity, No upper extremity supported ?Standing balance-Leahy Scale: Poor ?Standing balance comment: HHA for short-distance mobility vs no assist for static with min guard assist. Min assist for dynamic activity ?Single Leg Stance - Right Leg: 10 ?Single Leg Stance - Left Leg: 10 ?Tandem Stance - Right Leg: 10 ?  ?  ?  ?High level balance activites: Direction changes, Turns, Backward walking, Head turns ?High Level Balance Comments: Needs min guard assist with challenges to balance ?  ?  ?  ?  ? ?  ?Cognition Arousal/Alertness: Awake/alert ?Behavior During Therapy: Impulsive (Mild) ?Overall Cognitive Status: Impaired/Different from baseline ?Area of Impairment: Orientation, Attention, Memory, Safety/judgement, Awareness ?  ?  ?  ?  ?  ?  ?  ?  ?Orientation Level: Disoriented to, Time ?Current Attention Level: Sustained ?Memory: Decreased short-term memory ?  ?Safety/Judgement: Decreased awareness of safety, Decreased awareness of deficits ?Awareness: Emergent ?  ?General Comments: Patient requires increased time to process verbal information, mildly impulsive with poor awareness of lines/leads. Daughter reports cognition improved compared to yesterday. ?  ?  ? ?  ?Exercises General Exercises - Lower Extremity ?Toe Raises: AROM, Both, 10 reps, Standing ?Heel Raises: AROM, Both, 10 reps, Standing ? ?  ?General Comments General comments (skin integrity, edema, etc.): HR 115 - 132 bpm with activity ?  ?  ? ?Pertinent Vitals/Pain Pain Assessment ?Pain Assessment: No/denies pain  ? ? ?Home Living   ?  ?  ?  ?  ?  ?  ?  ?  ?  ?   ?  ?Prior Function    ?  ?  ?   ? ?PT Goals (current goals can now be found in the care plan section) Acute Rehab PT Goals ?Patient Stated Goal: to go home ?Progress towards PT goals: Progressing toward goals ? ?  ?Frequency ? ? ? Min 4X/week ? ? ? ?  ?PT Plan Current plan remains appropriate  ? ? ?Co-evaluation   ?  ?  ?  ?  ? ?  ?AM-PAC PT "6 Clicks" Mobility   ?Outcome Measure ? Help needed  turning from your back to your side while in a flat bed without using bedrails?: None ?Help needed moving from lying on your back to sitting on the side of a flat bed without using bedrails?: A Little ?Help needed moving to and from a bed to a chair (including a wheelchair)?: A Little ?Help needed standing up from a chair using your arms (e.g., wheelchair or bedside chair)?: A Little ?Help needed to walk in hospital room?: A Little ?Help needed climbing 3-5 steps with a railing? : A Lot ?6 Click Score: 18 ? ?  ?End of Session Equipment Utilized During Treatment: Gait belt ?Activity Tolerance: Patient limited by fatigue ?Patient left: with call bell/phone within reach;with family/visitor present;in bed;with bed alarm set ?Nurse Communication: Mobility status ?PT Visit Diagnosis: Muscle weakness (generalized) (M62.81) ?  ? ? ?Time: 0938-1000 ?PT Time Calculation (min) (ACUTE ONLY): 22 min ? ?Charges:  $Gait Training: 8-22 mins          ?          ? ?Avion Kutzer M,PT ?Acute Rehab Services ?[phone] ?[phone] (pager)  ? ? ?Alvira Philips ?07/07/2021, 11:58 AM ? ?

## 2021-07-08 ENCOUNTER — Other Ambulatory Visit (HOSPITAL_COMMUNITY): Payer: Self-pay

## 2021-07-08 DIAGNOSIS — I639 Cerebral infarction, unspecified: Secondary | ICD-10-CM

## 2021-07-08 LAB — MAGNESIUM: Magnesium: 2.1 mg/dL (ref 1.7–2.4)

## 2021-07-08 LAB — BASIC METABOLIC PANEL
Anion gap: 10 (ref 5–15)
BUN: 12 mg/dL (ref 8–23)
CO2: 27 mmol/L (ref 22–32)
Calcium: 8.9 mg/dL (ref 8.9–10.3)
Chloride: 97 mmol/L — ABNORMAL LOW (ref 98–111)
Creatinine, Ser: 0.88 mg/dL (ref 0.44–1.00)
GFR, Estimated: >60 mL/min (ref >60)
Glucose, Bld: 146 mg/dL — ABNORMAL HIGH (ref 70–99)
Potassium: 4 mmol/L (ref 3.5–5.1)
Sodium: 134 mmol/L — ABNORMAL LOW (ref 135–145)

## 2021-07-08 LAB — GLUCOSE, CAPILLARY
Glucose-Capillary: 125 mg/dL — ABNORMAL HIGH (ref 70–99)
Glucose-Capillary: 136 mg/dL — ABNORMAL HIGH (ref 70–99)
Glucose-Capillary: 141 mg/dL — ABNORMAL HIGH (ref 70–99)

## 2021-07-08 MED ORDER — CLOPIDOGREL BISULFATE 75 MG PO TABS
75.0000 mg | ORAL_TABLET | Freq: Every day | ORAL | 0 refills | Status: AC
  Filled 2021-07-08: qty 21, 21d supply, fill #0

## 2021-07-08 NOTE — Care Management Important Message (Signed)
Important Message ? ?Patient Details  ?Name: Gina Villarreal ?MRN: [id number] ?Date of Birth: 1945/06/09 ? ? ?Medicare Important Message Given:  Yes ? ?Patient left prior to IM delivery  ?Will mail to the patient home address.  ? ? ? ?Trannie Bardales ?07/08/2021, 2:01 PM ?

## 2021-07-08 NOTE — Progress Notes (Signed)
Physical Therapy Treatment ?Patient Details ?Name: Gina Villarreal ?MRN: [id number] ?DOB: 27-Nov-1945 ?Today's Date: 07/08/2021 ? ? ?History of Present Illness 76 y.o. female presenting to ED 2/27 with headaches and confusion. Patient admitted with acute metabolic encephalopathy and hypertensive urgency. L weakness, left facial droop, left sided neglect and left vision deficit on the evening of 2/28. Code stroke was activated. Deficits resolved at time of neuro exam with exception of hemianopsia. Imaging (-) for acute findings. PMHx significant for HTN, HLD, DMII and rectal cancer s/p colostomy. ? ?  ?PT Comments  ? ? Patient progressing well towards PT goals. Reports feeling pain in abdomen. Scored 18/24 on DGI indicating improvement in score and overall balance. Only mild deviations in gait noted with balance challenges especially walking backwards requiring Min A. Pt more aware of left inattention today and not bumping into anything in hallway. Able to follow 3 step instructions without difficulty during path finding in hallway. Eager to d/c home. Will follow. ?  ?Recommendations for follow up therapy are one component of a multi-disciplinary discharge planning process, led by the attending physician.  Recommendations may be updated based on patient status, additional functional criteria and insurance authorization. ? ?Follow Up Recommendations ? Outpatient PT ?  ?  ?Assistance Recommended at Discharge Set up Supervision/Assistance  ?Patient can return home with the following A little help with walking and/or transfers;A little help with bathing/dressing/bathroom ?  ?Equipment Recommendations ? None recommended by PT  ?  ?Recommendations for Other Services   ? ? ?  ?Precautions / Restrictions Precautions ?Precautions: Fall ?Precaution Comments: monitor HR; colosomy ?Restrictions ?Weight Bearing Restrictions: No  ?  ? ?Mobility ? Bed Mobility ?  ?  ?  ?  ?  ?  ?  ?General bed mobility comments: Up in chair upon PT  arrival. ?  ? ?Transfers ?Overall transfer level: Needs assistance ?Equipment used: None ?Transfers: Sit to/from Stand ?Sit to Stand: Supervision ?  ?  ?  ?  ?  ?General transfer comment: Supervision for safety. Stood from Automotive engineer. ?  ? ?Ambulation/Gait ?Ambulation/Gait assistance: Supervision ?Gait Distance (Feet): 600 Feet ?Assistive device: None ?Gait Pattern/deviations: Step-through pattern, Decreased stride length, Drifts right/left ?  ?Gait velocity interpretation: <1.31 ft/sec, indicative of household ambulator ?  ?General Gait Details: Mostly steady gait with mild drifting noted, tolerated higher level balance challenges without LOB, see balance section. No bumping into things on left side today. ? ? ?Stairs ?  ?  ?  ?  ?  ? ? ?Wheelchair Mobility ?  ? ?Modified Rankin (Stroke Patients Only) ?Modified Rankin (Stroke Patients Only) ?Pre-Morbid Rankin Score: No symptoms ?Modified Rankin: Moderately severe disability ? ? ?  ?Balance Overall balance assessment: Needs assistance ?Sitting-balance support: Feet supported, No upper extremity supported ?Sitting balance-Leahy Scale: Good ?Sitting balance - Comments: Able to donn/doff socks/shoes without difficulty or LOB. ?  ?Standing balance support: During functional activity ?Standing balance-Leahy Scale: Good ?  ?  ?  ?  ?  ?  ?  ?High level balance activites: Backward walking, Direction changes, Turns, Sudden stops, Head turns ?High Level Balance Comments: Able to walk backwards, change directions with only mild deviations in gait needing Min A at times ?Standardized Balance Assessment ?Standardized Balance Assessment : Dynamic Gait Index ?  ?Dynamic Gait Index ?Level Surface: Normal ?Change in Gait Speed: Mild Impairment ?Gait with Horizontal Head Turns: Mild Impairment ?Gait with Vertical Head Turns: Mild Impairment ?Gait and Pivot Turn: Mild Impairment ?Step Over Obstacle: Mild Impairment ?  Step Around Obstacles: Normal ?Steps: Mild Impairment ?Total Score:  18 ?  ? ?  ?Cognition Arousal/Alertness: Awake/alert ?Behavior During Therapy: Impulsive ?Overall Cognitive Status: Impaired/Different from baseline ?Area of Impairment: Safety/judgement ?  ?  ?  ?  ?  ?  ?  ?  ?  ?  ?  ?  ?Safety/Judgement: Decreased awareness of safety, Decreased awareness of deficits ?  ?  ?General Comments: Cognition seems improving, mildly impulsive. Able to follow 3 step instructions for path finding in hallway, good memory of tasks asked. No bumping into things on left today, better awareness of turning head ?  ?  ? ?  ?Exercises   ? ?  ?General Comments General comments (skin integrity, edema, etc.): Daughter present during session. Pt reports hallucinating yesterday and today, "but i know it is not real." ?  ?  ? ?Pertinent Vitals/Pain Pain Assessment ?Pain Assessment: Faces ?Faces Pain Scale: Hurts a little bit ?Pain Location: right side udner ribs ?Pain Descriptors / Indicators: Sore ?Pain Intervention(s): Monitored during session  ? ? ?Home Living   ?  ?  ?  ?  ?  ?  ?  ?  ?  ?   ?  ?Prior Function    ?  ?  ?   ? ?PT Goals (current goals can now be found in the care plan section) Progress towards PT goals: Progressing toward goals ? ?  ?Frequency ? ? ? Min 4X/week ? ? ? ?  ?PT Plan Current plan remains appropriate  ? ? ?Co-evaluation   ?  ?  ?  ?  ? ?  ?AM-PAC PT "6 Clicks" Mobility   ?Outcome Measure ? Help needed turning from your back to your side while in a flat bed without using bedrails?: None ?Help needed moving from lying on your back to sitting on the side of a flat bed without using bedrails?: None ?Help needed moving to and from a bed to a chair (including a wheelchair)?: A Little ?Help needed standing up from a chair using your arms (e.g., wheelchair or bedside chair)?: A Little ?Help needed to walk in hospital room?: A Little ?Help needed climbing 3-5 steps with a railing? : A Little ?6 Click Score: 20 ? ?  ?End of Session Equipment Utilized During Treatment: Gait  belt ?Activity Tolerance: Patient tolerated treatment well ?Patient left: in chair;with call bell/phone within reach;with family/visitor present ?Nurse Communication: Mobility status ?PT Visit Diagnosis: Muscle weakness (generalized) (M62.81) ?  ? ? ?Time: 0272-5366 ?PT Time Calculation (min) (ACUTE ONLY): 15 min ? ?Charges:  $Neuromuscular Re-education: 8-22 mins          ?          ? ?Marisa Severin, PT, DPT ?Acute Rehabilitation Services ?Pager [phone] ?Office [phone] ? ? ? ? ? ?[place] ?07/08/2021, 12:37 PM ? ?

## 2021-07-08 NOTE — Discharge Summary (Signed)
PATIENT DETAILS Name: TOMEEKA Villarreal Age: 76 y.o. Sex: female Date of Birth: 07/25/1945 MRN: [id number]. Admitting Physician: Norval Morton, MD KWI:[id number], Gwyndolyn Saxon, MD  Admit Date: 07/04/2021 Discharge date: 07/08/2021  Recommendations for Outpatient Follow-up:  Follow up with PCP in 1-2 weeks Please obtain CMP/CBC in one week Please ensure follow-up with neurology. ASA/Plavix x3 weeks followed by aspirin alone  Admitted From:  Home  Disposition: Home health   Discharge Condition: good  CODE STATUS:   Code Status: Full Code   Diet recommendation:  Diet Order             Diet - low sodium heart healthy           Diet Carb Modified           Diet heart healthy/carb modified Room service appropriate? Yes; Fluid consistency: Thin  Diet effective now                    Brief Summary: Patient is a 76 y.o.  female with history of HTN, HLD, DM-2, CAD, colon cancer-s/p ostomy-presented to the hospital with 2-3-week history of headache, intermittent diplopia-and confusion (recently started on oxycodone for headache).  She was subsequently admitted to the hospitalist service, posthospitalization-she developed transient left-sided weakness after a sneezing spell.  See below for further details.   Significant Hospital events: 2/27>> admit for evaluation of worsening frontal headaches, diplopia and confusion (recently started on oxycodone) 2/28>> sneezing spell-left-sided weakness-code stroke.   Significant studies: 2/27>> CXR: No PNA 2/27>> MRI brain: 3 mm cortical infarct per neurology note (official reading is negative for CVA) 2/27>> CT angio head/neck: No LVO or hemodynamically significant stenosis. 2/28>> CT head code stroke: No acute intracranial abnormality 2/28>> CT angio head/neck code stroke: No LVO 2/28>> MRI brain: No acute ischemia 3/01>> LDL 36 3/01>> A1c: 6.9 3/01>> Echo: EF 70-75% 3/01>> EEG: No seizures.   Significant microbiology data: 2/27>>  COVID/influenza PCR: Negative   Procedures: None   Consults: Neurology  Brief Hospital Course: Acute toxic encephalopathy: Suspect encephalopathy on admission was likely due to recent introduction of narcotic use for headaches.  Encephalopathy likely worsened due to use of lorazepam/Reglan/Phenergan/narcotics during the earlier part of this hospital stay.  Thankfully encephalopathy has now improved-although significantly better-she has some occasional visual hallucinations.  Spoke with neurologist-Dr. Donnetta Simpers over the phone on 3/3-he suggested we continue supportive care-and thought that once patient gets back to her familiar surroundings-she should continue to improve.  Neurology-thought that patient may have some hospital associated delirium.  Neuroimaging/EEG as above.   Intractable headache: Likely migraine headaches-resolved-after treatment with IV Toradol Phenergan/Reglan.  No further headaches for the past 3-days.  Since patient has had some lingering hallucinations-improving encephalopathy-have not started her on any prophylactic medications at this point.  If headaches were to recur-patient will likely need to be started on prophylactic medications for migraine.  Please avoid narcotics as much as possible.   Transient left-sided facial droop/blurry vision/double vision:?  Secondary to complicated migraine.  Neurological deficits have resolved.  Mentation is much better-patient is now much more fluent with speech-and is ambulating in the room independently.  As noted above-encephalopathy has improved significantly-however she still has some occasional hallucinations (see above).  Suspect that acute stroke on MRI is an incidental finding.     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  Evaluated by stroke MD during this hospitalization.  CAD-s/p PCI to RCA 2003, PCI to LAD  in 2016: Continue antiplatelet/statin.  Currently with no anginal symptoms.   Hypokalemia: Repleted.   Hyponatremia: Mild-of no clinical significance.   Leukocytosis: Has resolved-was likely reactive.     HTN: BP stable-initially permissive hypertension was allowed-will resume usual antihypertensives on discharge.   HLD: Continue statin/Zetia   DM-2: CBGs stable-resume Trulicity, metformin and glipizide on discharge.  Further optimization deferred to outpatient setting.  Obesity: Estimated body mass index is 26.12 kg/m as calculated from the following:   Height as of this encounter: 5\' 5"  (1.651 m).   Weight as of this encounter: 71.2 kg.    Discharge Diagnoses:  Principal Problem:   Acute metabolic encephalopathy Active Problems:   Rectal cancer ([place])   Uncontrolled type 2 diabetes mellitus with hyperglycemia, without long-term current use of insulin (HCC)   Hypertensive urgency   Hyperlipidemia   Coronary artery disease due to lipid rich plaque   Leukocytosis   Hypothyroidism   Hypokalemia   Hyponatremia   Intractable headache   Discharge Instructions:  Activity:  As tolerated with Full fall precautions use walker/cane & assistance as needed  Discharge Instructions     Ambulatory referral to Neurology   Complete by: As directed    An appointment is requested in approximately: 2 weeks   Diet - low sodium heart healthy   Complete by: As directed    Diet Carb Modified   Complete by: As directed    Discharge instructions   Complete by: As directed    Follow with Primary MD  Shirline Frees, MD in 1-2 weeks  Take aspirin and Plavix x21 days, after 21 days stop Plavix and continue on aspirin  You will get a call from neurology office for a follow-up appointment  Please get a complete blood count and chemistry panel checked by your Primary MD at your next visit, and again as instructed by your Primary MD.  Get Medicines reviewed and adjusted: Please take all  your medications with you for your next visit with your Primary MD  Laboratory/radiological data: Please request your Primary MD to go over all hospital tests and procedure/radiological results at the follow up, please ask your Primary MD to get all Hospital records sent to his/her office.  In some cases, they will be blood work, cultures and biopsy results pending at the time of your discharge. Please request that your primary care M.D. follows up on these results.  Also Note the following: If you experience worsening of your admission symptoms, develop shortness of breath, life threatening emergency, suicidal or homicidal thoughts you must seek medical attention immediately by calling 911 or calling your MD immediately  if symptoms less severe.  You must read complete instructions/literature along with all the possible adverse reactions/side effects for all the Medicines you take and that have been prescribed to you. Take any new Medicines after you have completely understood and accpet all the possible adverse reactions/side effects.   Do not drive when taking Pain medications or sleeping medications (Benzodaizepines)  Do not take more than prescribed Pain, Sleep and Anxiety Medications. It is not advisable to combine anxiety,sleep and pain medications without talking with your primary care practitioner  Special Instructions: If you have smoked or chewed Tobacco  in the last 2 yrs please stop smoking, stop any regular Alcohol  and or any Recreational drug use.  Wear Seat belts while driving.  Please note: You were cared for by a hospitalist during your hospital  stay. Once you are discharged, your primary care physician will handle any further medical issues. Please note that NO REFILLS for any discharge medications will be authorized once you are discharged, as it is imperative that you return to your primary care physician (or establish a relationship with a primary care physician if you do  not have one) for your post hospital discharge needs so that they can reassess your need for medications and monitor your lab values.   Increase activity slowly   Complete by: As directed       Allergies as of 07/08/2021       Reactions   Metoprolol Shortness Of Breath   Morphine And Related Other (See Comments)   hallucinates        Medication List     STOP taking these medications    omega-3 acid ethyl esters 1 g capsule Commonly known as: Lovaza       TAKE these medications    aspirin 81 MG chewable tablet Chew 1 tablet (81 mg total) by mouth daily.   clopidogrel 75 MG tablet Commonly known as: PLAVIX Take 1 tablet (75 mg total) by mouth daily for 21 days. Start taking on: July 09, 2021   ezetimibe 10 MG tablet Commonly known as: ZETIA TAKE ONE TABLET BY MOUTH DAILY   glimepiride 4 MG tablet Commonly known as: AMARYL Take 4 mg by mouth 2 (two) times daily.   GLUCOSAMINE-CHONDROITIN PO Take 1 tablet by mouth daily. 1500-1200 mg   levothyroxine 112 MCG tablet Commonly known as: SYNTHROID Take 112 mcg by mouth daily. What changed: Another medication with the same name was removed. Continue taking this medication, and follow the directions you see here.   lisinopril-hydrochlorothiazide 10-12.5 MG tablet Commonly known as: ZESTORETIC Take 1 tablet by mouth daily.   metFORMIN 1000 MG tablet Commonly known as: GLUCOPHAGE Take 1,000 mg by mouth 2 (two) times daily with a meal.   nitroGLYCERIN 0.4 MG SL tablet Commonly known as: NITROSTAT Place 1 tablet (0.4 mg total) under the tongue every 5 (five) minutes as needed for chest pain.   pravastatin 40 MG tablet Commonly known as: PRAVACHOL Take 40 mg by mouth 2 (two) times daily.   Trulicity 1.5 EL/3.8BO Sopn Generic drug: Dulaglutide Inject 1.5 mg into the skin every Thursday.   Vitamin D (Ergocalciferol) 1.25 MG (50000 UNIT) Caps capsule Commonly known as: DRISDOL Take 50,000 Units by mouth once a  week.        Follow-up Information     [facility] Follow up.   Specialty: [place] Why: [facility] ( [facility]  for [facility] health PT OT and speech- start of service Tuesday March 7th, 2023 They will call to set up services Contact information: [place] [address] [phone]         Shirline Frees, MD. Schedule an appointment as soon as possible for a visit in 1 week(s).   Specialty: Family Medicine Contact information: [place] [address] [phone]         [facility] Follow up.   Why: Hospital follow up, Office will call with date/time, If you dont hear from them,please give them a call Contact information: [place] [phone]               Allergies  Allergen Reactions   Metoprolol Shortness Of Breath   Morphine And  Related Other (See Comments)    hallucinates     Other Procedures/Studies: CT ANGIO HEAD NECK W WO CM  Result Date: 07/04/2021 CLINICAL DATA:  Altered mental status EXAM: CT ANGIOGRAPHY HEAD AND NECK TECHNIQUE: Multidetector CT imaging of the head and neck was performed using the standard protocol during bolus administration of intravenous contrast. Multiplanar CT image reconstructions and MIPs were obtained to evaluate the vascular anatomy. Carotid stenosis measurements (when applicable) are obtained utilizing NASCET criteria, using the distal internal carotid diameter as the denominator. RADIATION DOSE REDUCTION: This exam was performed according to the departmental dose-optimization program which includes automated exposure control, adjustment of the mA and/or kV according to patient size and/or use of iterative reconstruction technique. CONTRAST:  55mL OMNIPAQUE IOHEXOL 350 MG/ML SOLN COMPARISON:  None. FINDINGS: CT HEAD FINDINGS Brain: There is no mass, hemorrhage or extra-axial  collection. The size and configuration of the ventricles and extra-axial CSF spaces are normal. There is no acute or chronic infarction. The brain parenchyma is normal. Skull: The visualized skull base, calvarium and extracranial soft tissues are normal. Sinuses/Orbits: No fluid levels or advanced mucosal thickening of the visualized paranasal sinuses. No mastoid or middle ear effusion. The orbits are normal. CTA NECK FINDINGS SKELETON: There is no bony spinal canal stenosis. No lytic or blastic lesion. OTHER NECK: Normal pharynx, larynx and major salivary glands. No cervical lymphadenopathy. Unremarkable thyroid gland. UPPER CHEST: No pneumothorax or pleural effusion. No nodules or masses. AORTIC ARCH: There is calcific atherosclerosis of the aortic arch. There is no aneurysm, dissection or hemodynamically significant stenosis of the visualized portion of the aorta. Conventional 3 vessel aortic branching pattern. The visualized proximal subclavian arteries are widely patent. RIGHT CAROTID SYSTEM: No dissection, occlusion or aneurysm. Mild atherosclerotic calcification at the carotid bifurcation without hemodynamically significant stenosis. LEFT CAROTID SYSTEM: No dissection, occlusion or aneurysm. Mild atherosclerotic calcification at the carotid bifurcation without hemodynamically significant stenosis. VERTEBRAL ARTERIES: Left dominant configuration. Both origins are clearly patent. There is no dissection, occlusion or flow-limiting stenosis to the skull base (V1-V3 segments). CTA HEAD FINDINGS POSTERIOR CIRCULATION: --Vertebral arteries: Normal V4 segments. --Inferior cerebellar arteries: Normal. --Basilar artery: Normal. --Superior cerebellar arteries: Normal. --Posterior cerebral arteries (PCA): Normal. ANTERIOR CIRCULATION: --Intracranial internal carotid arteries: Normal. --Anterior cerebral arteries (ACA): Normal. Both A1 segments are present. Patent anterior communicating artery (a-comm). --Middle cerebral  arteries (MCA): Normal. VENOUS SINUSES: As permitted by contrast timing, patent. ANATOMIC VARIANTS: None Review of the MIP images confirms the above findings. IMPRESSION: 1. No emergent large vessel occlusion or hemodynamically significant stenosis of the head or neck. Aortic Atherosclerosis (ICD10-I70.0). Electronically Signed   By: Ulyses Jarred M.D.   On: 07/04/2021 23:27   DG Chest 2 View  Result Date: 07/04/2021 CLINICAL DATA:  Altered mental status EXAM: CHEST - 2 VIEW COMPARISON:  01/29/2015 FINDINGS: Transverse diameter of heart is slightly increased. There are no signs of pulmonary edema or focal pulmonary consolidation. There is no pleural effusion or pneumothorax. IMPRESSION: No active cardiopulmonary disease. Electronically Signed   By: Elmer Picker M.D.   On: 07/04/2021 16:54   CT Head Wo Contrast  Result Date: 06/30/2021 CLINICAL DATA:  Sinus infection EXAM: CT HEAD WITHOUT CONTRAST TECHNIQUE: Contiguous axial images were obtained from the base of the skull through the vertex without intravenous contrast. RADIATION DOSE REDUCTION: This exam was performed according to the departmental dose-optimization program which includes automated exposure control, adjustment of the mA and/or kV according to patient size and/or use of iterative reconstruction  technique. COMPARISON:  None. FINDINGS: Brain: No acute intracranial hemorrhage. No focal mass lesion. No CT evidence of acute infarction. No midline shift or mass effect. No hydrocephalus. Basilar cisterns are patent. Minimal white matter subcortical hypodensities. Vascular: No hyperdense vessel or unexpected calcification. Skull: Normal. Negative for fracture or focal lesion. Sinuses/Orbits: Paranasal sinuses and mastoid air cells are clear. Orbits are clear. Other: None. IMPRESSION: 1. No acute intracranial findings. 2. Minimal white matter microvascular disease Electronically Signed   By: Suzy Bouchard M.D.   On: 06/30/2021 08:41   MR  BRAIN WO CONTRAST  Result Date: 07/05/2021 CLINICAL DATA:  Acute neurologic deficits EXAM: MRI HEAD WITHOUT CONTRAST TECHNIQUE: Multiplanar, multiecho pulse sequences of the brain and surrounding structures were obtained without intravenous contrast. COMPARISON:  None. FINDINGS: Motion degraded and truncated examination. Diffusion-weighted imaging shows no acute ischemia. Otherwise limited brain MRI shows a partially empty sella. IMPRESSION: Motion degraded and truncated examination. No acute ischemia. These results were communicated to Dr. Donnetta Simpers at 7:24 pm on 07/05/2021 by text page via the [facility] messaging system. Electronically Signed   By: Ulyses Jarred M.D.   On: 07/05/2021 19:24   MR BRAIN WO CONTRAST  Result Date: 07/04/2021 CLINICAL DATA:  Neuro deficit, stroke suspected EXAM: MRI HEAD WITHOUT CONTRAST TECHNIQUE: Multiplanar, multiecho pulse sequences of the brain and surrounding structures were obtained without intravenous contrast. COMPARISON:  Noncontrast CT head 06/30/2021 FINDINGS: Brain: There is no evidence of acute intracranial hemorrhage, extra-axial fluid collection, or acute infarct. Background parenchymal volume is normal. The ventricles are normal in size. There is a minimal burden of white matter microangiopathic change. There is no suspicious parenchymal signal abnormality. There is no mass lesion. There is no midline shift. 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 Vascular: Normal flow voids. Skull and upper cervical spine: Normal marrow signal. Sinuses/Orbits: The paranasal sinuses are clear. The globes and orbits are unremarkable. Other: None. IMPRESSION: No acute intracranial pathology. Electronically Signed   By: Valetta Mole M.D.   On: 07/04/2021 14:57   EEG adult  Result Date: 07/06/2021 Lora Havens, MD     07/06/2021  3:40 PM Patient Name: Gina ASEBEDO MRN: [id number] Epilepsy Attending: Lora Havens Referring Physician/Provider: Donnetta Simpers  Date: 07/06/2021 Duration: 22.34 mins Patient history: 76 y.o. female stroke risk factors including diabetes, hypertension, hyperlipidemia and rectal cancer status post colostomy who was admitted with confusion and headache.  EEG to evaluate for seizure Level of alertness: Awake,asleep AEDs during EEG study: None Technical aspects: This EEG study was done with scalp electrodes positioned according to the 10-20 International system of electrode placement. Electrical activity was acquired at a sampling rate of 500Hz  and reviewed with a high frequency filter of 70Hz  and a low frequency filter of 1Hz . EEG data were recorded continuously and digitally stored. Description: The posterior dominant rhythm consists of 9 Hz activity of moderate voltage (25-35 uV) seen predominantly in posterior head regions, symmetric and reactive to eye opening and eye closing. Sleep was characterized by vertex waves, sleep spindles (12 to 14 Hz), maximal frontocentral region. Hyperventilation and photic stimulation were not performed.   IMPRESSION: This study is within normal limits. No seizures or epileptiform discharges were seen throughout the recording. Lora Havens   ECHOCARDIOGRAM COMPLETE  Result Date: 07/06/2021    ECHOCARDIOGRAM REPORT   Patient Name:   CALVARY DIFRANCO Date of Exam: 07/06/2021 Medical Rec #:  [id number]     Height:  65.0 in Accession #:    [id number]    Weight:       157.0 lb Date of Birth:  12/11/1945     BSA:          1.785 m Patient Age:    58 years      BP:           112/99 mmHg Patient Gender: F             HR:           104 bpm. Exam Location:  Inpatient Procedure: 2D Echo Indications:    Stroke  History:        Patient has prior history of Echocardiogram examinations, most                 recent 03/03/2015. CAD; Risk Factors:Diabetes.  Sonographer:    Jefferey Pica Referring Phys: [id number] [place]  1. Left ventricular ejection fraction, by estimation, is 70 to 75%. The left  ventricle has hyperdynamic function. The left ventricle has no regional wall motion abnormalities. There is moderate left ventricular hypertrophy. Left ventricular diastolic parameters are consistent with Grade I diastolic dysfunction (impaired relaxation).  2. Right ventricular systolic function is normal. The right ventricular size is normal.  3. The mitral valve is normal in structure. Trivial mitral valve regurgitation.  4. The aortic valve is normal in structure. Aortic valve regurgitation is not visualized. No aortic stenosis is present. FINDINGS  Left Ventricle: Left ventricular ejection fraction, by estimation, is 70 to 75%. The left ventricle has hyperdynamic function. The left ventricle has no regional wall motion abnormalities. The left ventricular internal cavity size was normal in size. There is moderate left ventricular hypertrophy. Left ventricular diastolic parameters are consistent with Grade I diastolic dysfunction (impaired relaxation). Right Ventricle: The right ventricular size is normal. Right vetricular wall thickness was not well visualized. Right ventricular systolic function is normal. Left Atrium: Left atrial size was normal in size. Right Atrium: Right atrial size was normal in size. Pericardium: There is no evidence of pericardial effusion. Mitral Valve: The mitral valve is normal in structure. Trivial mitral valve regurgitation. Tricuspid Valve: The tricuspid valve is normal in structure. Tricuspid valve regurgitation is trivial. Aortic Valve: The aortic valve is normal in structure. Aortic valve regurgitation is not visualized. No aortic stenosis is present. Aortic valve peak gradient measures 9.4 mmHg. Pulmonic Valve: The pulmonic valve was normal in structure. Pulmonic valve regurgitation is not visualized. Aorta: The aortic root and ascending aorta are structurally normal, with no evidence of dilitation. IAS/Shunts: The atrial septum is grossly normal.  LEFT VENTRICLE PLAX 2D LVIDd:          3.10 cm   Diastology LVIDs:         1.40 cm   LV e' lateral:   4.73 cm/s LV PW:         1.50 cm   LV E/e' lateral: 13.2 LV IVS:        1.60 cm LVOT diam:     1.90 cm LV SV:         68 LV SV Index:   38 LVOT Area:     2.84 cm  IVC IVC diam: 1.50 cm LEFT ATRIUM             Index        RIGHT ATRIUM          Index LA diam:        2.50  cm 1.40 cm/m   RA Area:     9.51 cm LA Vol (A2C):   24.6 ml 13.78 ml/m  RA Volume:   17.90 ml 10.03 ml/m LA Vol (A4C):   26.5 ml 14.85 ml/m LA Biplane Vol: 27.3 ml 15.30 ml/m  AORTIC VALVE                 PULMONIC VALVE AV Area (Vmax): 2.36 cm     PV Vmax:       0.94 m/s AV Vmax:        153.50 cm/s  PV Peak grad:  3.6 mmHg AV Peak Grad:   9.4 mmHg LVOT Vmax:      128.00 cm/s LVOT Vmean:     80.700 cm/s LVOT VTI:       0.240 m  AORTA Ao Root diam: 3.00 cm Ao Asc diam:  2.80 cm MITRAL VALVE MV Area (PHT): 2.90 cm     SHUNTS MV Decel Time: 262 msec     Systemic VTI:  0.24 m MV E velocity: 62.60 cm/s   Systemic Diam: 1.90 cm MV A velocity: 118.00 cm/s MV E/A ratio:  0.53 Mertie Moores MD Electronically signed by Mertie Moores MD Signature Date/Time: 07/06/2021/11:24:26 AM    Final    CT HEAD CODE STROKE WO CONTRAST  Result Date: 07/05/2021 CLINICAL DATA:  Code stroke. Acute neuro deficit. Slurred speech facial droop EXAM: CT HEAD WITHOUT CONTRAST TECHNIQUE: Contiguous axial images were obtained from the base of the skull through the vertex without intravenous contrast. RADIATION DOSE REDUCTION: This exam was performed according to the departmental dose-optimization program which includes automated exposure control, adjustment of the mA and/or kV according to patient size and/or use of iterative reconstruction technique. COMPARISON:  CT head 07/04/2021.  MRI head 07/04/2021 FINDINGS: Brain: Ventricle size and cerebral volume normal for age. Negative for acute hemorrhage or mass. Review of the MRI yesterday reveals a small focus of restricted diffusion in the left middle  frontal gyrus likely due to a small acute infarct approximally 3 mm. This is not identified by CT. Vascular: Negative for hyperdense vessel Skull: Negative Sinuses/Orbits: Paranasal sinuses clear.  Negative orbit Other: None ASPECTS ([place] Stroke Program Early CT Score) - Ganglionic level infarction (caudate, lentiform nuclei, internal capsule, insula, M1-M3 cortex): 7 - Supraganglionic infarction (M4-M6 cortex): 3 Total score (0-10 with 10 being normal): 10 IMPRESSION: 1. CT head negative for acute abnormality. 2. ASPECTS is 10 3. Review of MRI yesterday reveals a small 3 mm focus of restricted diffusion left frontal cortex compatible with acute infarct. 4. These results were called by telephone at the time of interpretation on 07/05/2021 at 6:54 pm to provider [facility] , who verbally acknowledged these results. Electronically Signed   By: Franchot Gallo M.D.   On: 07/05/2021 18:55   CT ANGIO HEAD NECK W WO CM (CODE STROKE)  Result Date: 07/05/2021 CLINICAL DATA:  Stroke.  Slurred speech and facial droop. EXAM: CT ANGIOGRAPHY HEAD AND NECK TECHNIQUE: Multidetector CT imaging of the head and neck was performed using the standard protocol during bolus administration of intravenous contrast. Multiplanar CT image reconstructions and MIPs were obtained to evaluate the vascular anatomy. Carotid stenosis measurements (when applicable) are obtained utilizing NASCET criteria, using the distal internal carotid diameter as the denominator. RADIATION DOSE REDUCTION: This exam was performed according to the departmental dose-optimization program which includes automated exposure control, adjustment of the mA and/or kV according to patient size and/or use of iterative reconstruction technique.  CONTRAST:  36mL OMNIPAQUE IOHEXOL 350 MG/ML SOLN COMPARISON:  CT angio head and neck 07/04/2021.  CT head 07/05/2021 FINDINGS: CTA NECK FINDINGS Aortic arch: Standard branching. Imaged portion shows no evidence of aneurysm or  dissection. No significant stenosis of the major arch vessel origins. Mild atherosclerotic aorta. Right carotid system: Mild atherosclerotic disease right carotid bifurcation. Negative for right carotid stenosis or dissection. Left carotid system: Mild atherosclerotic calcification left carotid bifurcation. Negative for stenosis or dissection. Vertebral arteries: Small vertebral arteries bilaterally without stenosis. Skeleton: Mild degenerative change cervical spine. No acute skeletal abnormality. Other neck: Negative for mass or adenopathy in the neck. Upper chest: Lung apices clear bilaterally. Review of the MIP images confirms the above findings CTA HEAD FINDINGS Anterior circulation: Internal carotid artery widely patent through the skull base and cavernous segment. Mild atherosclerotic disease in the cavernous carotid bilaterally. Anterior and middle cerebral arteries widely patent without stenosis or large vessel occlusion. Negative for aneurysm. Posterior circulation: Both vertebral arteries patent to the basilar. PICA patent bilaterally. Basilar patent. Superior cerebellar arteries patent bilaterally. Fetal origin of the posterior cerebral artery bilaterally without stenosis. Venous sinuses: Normal venous enhancement Anatomic variants: None Review of the MIP images confirms the above findings IMPRESSION: 1. No intracranial large vessel occlusion or significant stenosis 2. Mild atherosclerotic disease in the carotid bifurcation bilaterally without stenosis. Both vertebral arteries are patent without stenosis. Electronically Signed   By: Franchot Gallo M.D.   On: 07/05/2021 19:02     TODAY-DAY OF DISCHARGE:  Subjective:   Wilhemina Bonito today has no headache,no chest abdominal pain,no new weakness tingling or numbness, feels much better wants to go home today.  Objective:   Blood pressure (!) 156/76, pulse 96, temperature 97.8 F (36.6 C), temperature source Oral, resp. rate 17, height 5\' 5"  (1.651  m), weight 71.2 kg, SpO2 97 %. No intake or output data in the 24 hours ending 07/08/21 1108 Filed Weights   07/05/21 1100  Weight: 71.2 kg    Exam: Awake Alert, Oriented *3, No new F.N deficits, Normal affect [place].AT,PERRAL Supple Neck,No JVD, No cervical lymphadenopathy appriciated.  Symmetrical Chest wall movement, Good air movement bilaterally, CTAB RRR,No Gallops,Rubs or new Murmurs, No Parasternal Heave +ve B.Sounds, Abd Soft, Non tender, No organomegaly appriciated, No rebound -guarding or rigidity. No Cyanosis, Clubbing or edema, No new Rash or bruise   PERTINENT RADIOLOGIC STUDIES: EEG adult  Result Date: Jul 16, 2021 Lora Havens, MD     2021-07-16  3:40 PM Patient Name: Gina Villarreal MRN: [id number] Epilepsy Attending: Lora Havens Referring Physician/Provider: Donnetta Simpers Date: 2021-07-16 Duration: 22.34 mins Patient history: 76 y.o. female stroke risk factors including diabetes, hypertension, hyperlipidemia and rectal cancer status post colostomy who was admitted with confusion and headache.  EEG to evaluate for seizure Level of alertness: Awake,asleep AEDs during EEG study: None Technical aspects: This EEG study was done with scalp electrodes positioned according to the 10-20 International system of electrode placement. Electrical activity was acquired at a sampling rate of 500Hz  and reviewed with a high frequency filter of 70Hz  and a low frequency filter of 1Hz . EEG data were recorded continuously and digitally stored. Description: The posterior dominant rhythm consists of 9 Hz activity of moderate voltage (25-35 uV) seen predominantly in posterior head regions, symmetric and reactive to eye opening and eye closing. Sleep was characterized by vertex waves, sleep spindles (12 to 14 Hz), maximal frontocentral region. Hyperventilation and photic stimulation were not performed.   IMPRESSION: This study is  within normal limits. No seizures or epileptiform discharges were seen  throughout the recording. Priyanka Barbra Sarks     PERTINENT LAB RESULTS: CBC: Recent Labs    07/06/21 0420 07/07/21 0417  WBC 15.5* 9.5  HGB 14.6 13.0  HCT 42.2 37.6  PLT 373 374   CMET CMP     Component Value Date/Time   NA 134 (L) 07/08/2021 0735   K 4.0 07/08/2021 0735   CL 97 (L) 07/08/2021 0735   CO2 27 07/08/2021 0735   GLUCOSE 146 (H) 07/08/2021 0735   BUN 12 07/08/2021 0735   CREATININE 0.88 07/08/2021 0735   CREATININE 0.81 01/29/2015 1312   CALCIUM 8.9 07/08/2021 0735   PROT 6.5 07/05/2021 0950   ALBUMIN 3.7 07/05/2021 0950   AST 18 07/05/2021 0950   ALT 17 07/05/2021 0950   ALKPHOS 57 07/05/2021 0950   BILITOT 1.1 07/05/2021 0950   GFRNONAA >60 07/08/2021 0735   GFRAA >60 02/02/2015 0352    GFR Estimated Creatinine Clearance: 54.7 mL/min (by C-G formula based on SCr of 0.88 mg/dL). No results for input(s): LIPASE, AMYLASE in the last 72 hours. No results for input(s): CKTOTAL, CKMB, CKMBINDEX, TROPONINI in the last 72 hours. Invalid input(s): POCBNP No results for input(s): DDIMER in the last 72 hours. Recent Labs    07/06/21 0420  HGBA1C 6.9*   Recent Labs    07/06/21 0420  CHOL 121  HDL 54  LDLCALC 36  TRIG 156*  CHOLHDL 2.2   No results for input(s): TSH, T4TOTAL, T3FREE, THYROIDAB in the last 72 hours.  Invalid input(s): FREET3 No results for input(s): VITAMINB12, FOLATE, FERRITIN, TIBC, IRON, RETICCTPCT in the last 72 hours. Coags: No results for input(s): INR in the last 72 hours.  Invalid input(s): PT Microbiology: Recent Results (from the past 240 hour(s))  Resp Panel by RT-PCR (Flu A&B, Covid) Nasopharyngeal Swab     Status: None   Collection Time: 06/30/21  9:35 AM   Specimen: Nasopharyngeal Swab; Nasopharyngeal(NP) swabs in vial transport medium  Result Value Ref Range Status   SARS Coronavirus 2 by RT PCR NEGATIVE NEGATIVE Final    Comment: (NOTE) SARS-CoV-2 target nucleic acids are NOT DETECTED.  The SARS-CoV-2 RNA is  generally detectable in upper respiratory specimens during the acute phase of infection. The lowest concentration of SARS-CoV-2 viral copies this assay can detect is 138 copies/mL. A negative result does not preclude SARS-Cov-2 infection and should not be used as the sole basis for treatment or other patient management decisions. A negative result may occur with  improper specimen collection/handling, submission of specimen other than nasopharyngeal swab, presence of viral mutation(s) within the areas targeted by this assay, and inadequate number of viral copies(<138 copies/mL). A negative result must be combined with clinical observations, patient history, and epidemiological information. The expected result is Negative.  Fact Sheet for Patients:  EntrepreneurPulse.com.au  Fact Sheet for Healthcare Providers:  IncredibleEmployment.be  This test is no t yet approved or cleared by the [place] FDA and  has been authorized for detection and/or diagnosis of SARS-CoV-2 by FDA under an Emergency Use Authorization (EUA). This EUA will remain  in effect (meaning this test can be used) for the duration of the COVID-19 declaration under Section 564(b)(1) of the Act, 21 U.S.C.section 360bbb-3(b)(1), unless the authorization is terminated  or revoked sooner.       Influenza A by PCR NEGATIVE NEGATIVE Final   Influenza B by PCR NEGATIVE NEGATIVE Final    Comment: (NOTE)  The Xpert Xpress SARS-CoV-2/FLU/RSV plus assay is intended as an aid in the diagnosis of influenza from Nasopharyngeal swab specimens and should not be used as a sole basis for treatment. Nasal washings and aspirates are unacceptable for Xpert Xpress SARS-CoV-2/FLU/RSV testing.  Fact Sheet for Patients: EntrepreneurPulse.com.au  Fact Sheet for Healthcare Providers: IncredibleEmployment.be  This test is not yet approved or cleared by the [place] FDA and has been authorized for detection and/or diagnosis of SARS-CoV-2 by FDA under an Emergency Use Authorization (EUA). This EUA will remain in effect (meaning this test can be used) for the duration of the COVID-19 declaration under Section 564(b)(1) of the Act, 21 U.S.C. section 360bbb-3(b)(1), unless the authorization is terminated or revoked.  Performed at [facility], [address] [place], [place] [address]   Resp Panel by RT-PCR (Flu A&B, Covid) Nasopharyngeal Swab     Status: None   Collection Time: 07/04/21  1:34 PM   Specimen: Nasopharyngeal Swab; Nasopharyngeal(NP) swabs in vial transport medium  Result Value Ref Range Status   SARS Coronavirus 2 by RT PCR NEGATIVE NEGATIVE Final    Comment: (NOTE) SARS-CoV-2 target nucleic acids are NOT DETECTED.  The SARS-CoV-2 RNA is generally detectable in upper respiratory specimens during the acute phase of infection. The lowest concentration of SARS-CoV-2 viral copies this assay can detect is 138 copies/mL. A negative result does not preclude SARS-Cov-2 infection and should not be used as the sole basis for treatment or other patient management decisions. A negative result may occur with  improper specimen collection/handling, submission of specimen other than nasopharyngeal swab, presence of viral mutation(s) within the areas targeted by this assay, and inadequate number of viral copies(<138 copies/mL). A negative result must be combined with clinical observations, patient history, and epidemiological information. The expected result is Negative.  Fact Sheet for Patients:  EntrepreneurPulse.com.au  Fact Sheet for Healthcare Providers:  IncredibleEmployment.be  This test is no t yet approved or cleared by the [place] FDA and  has been authorized for detection and/or diagnosis of SARS-CoV-2 by FDA under an Emergency Use Authorization (EUA). This EUA  will remain  in effect (meaning this test can be used) for the duration of the COVID-19 declaration under Section 564(b)(1) of the Act, 21 U.S.C.section 360bbb-3(b)(1), unless the authorization is terminated  or revoked sooner.       Influenza A by PCR NEGATIVE NEGATIVE Final   Influenza B by PCR NEGATIVE NEGATIVE Final    Comment: (NOTE) The Xpert Xpress SARS-CoV-2/FLU/RSV plus assay is intended as an aid in the diagnosis of influenza from Nasopharyngeal swab specimens and should not be used as a sole basis for treatment. Nasal washings and aspirates are unacceptable for Xpert Xpress SARS-CoV-2/FLU/RSV testing.  Fact Sheet for Patients: EntrepreneurPulse.com.au  Fact Sheet for Healthcare Providers: IncredibleEmployment.be  This test is not yet approved or cleared by the [place] FDA and has been authorized for detection and/or diagnosis of SARS-CoV-2 by FDA under an Emergency Use Authorization (EUA). This EUA will remain in effect (meaning this test can be used) for the duration of the COVID-19 declaration under Section 564(b)(1) of the Act, 21 U.S.C. section 360bbb-3(b)(1), unless the authorization is terminated or revoked.  Performed at [facility], [place] [address], [place], [place] [address]     FURTHER DISCHARGE INSTRUCTIONS:  Get Medicines reviewed and adjusted: Please take all your medications with you for your next visit with your Primary MD  Laboratory/radiological data: Please request your Primary MD to  go over all hospital tests and procedure/radiological results at the follow up, please ask your Primary MD to get all Hospital records sent to his/her office.  In some cases, they will be blood work, cultures and biopsy results pending at the time of your discharge. Please request that your primary care M.D. goes through all the records of your hospital data and follows up on these results.  Also Note the  following: If you experience worsening of your admission symptoms, develop shortness of breath, life threatening emergency, suicidal or homicidal thoughts you must seek medical attention immediately by calling 911 or calling your MD immediately  if symptoms less severe.  You must read complete instructions/literature along with all the possible adverse reactions/side effects for all the Medicines you take and that have been prescribed to you. Take any new Medicines after you have completely understood and accpet all the possible adverse reactions/side effects.   Do not drive when taking Pain medications or sleeping medications (Benzodaizepines)  Do not take more than prescribed Pain, Sleep and Anxiety Medications. It is not advisable to combine anxiety,sleep and pain medications without talking with your primary care practitioner  Special Instructions: If you have smoked or chewed Tobacco  in the last 2 yrs please stop smoking, stop any regular Alcohol  and or any Recreational drug use.  Wear Seat belts while driving.  Please note: You were cared for by a hospitalist during your hospital stay. Once you are discharged, your primary care physician will handle any further medical issues. Please note that NO REFILLS for any discharge medications will be authorized once you are discharged, as it is imperative that you return to your primary care physician (or establish a relationship with a primary care physician if you do not have one) for your post hospital discharge needs so that they can reassess your need for medications and monitor your lab values.  Total Time spent coordinating discharge including counseling, education and face to face time equals greater than 30 minutes.  SignedOren Binet 07/08/2021 11:08 AM

## 2021-07-12 DIAGNOSIS — R519 Headache, unspecified: Secondary | ICD-10-CM | POA: Diagnosis not present

## 2021-07-13 ENCOUNTER — Ambulatory Visit: Payer: Medicare Other | Admitting: Diagnostic Neuroimaging

## 2021-07-13 ENCOUNTER — Encounter: Payer: Self-pay | Admitting: Diagnostic Neuroimaging

## 2021-07-13 VITALS — BP 122/58 | HR 84 | Ht 65.0 in | Wt 151.4 lb

## 2021-07-13 DIAGNOSIS — G43101 Migraine with aura, not intractable, with status migrainosus: Secondary | ICD-10-CM | POA: Diagnosis not present

## 2021-07-13 DIAGNOSIS — G43109 Migraine with aura, not intractable, without status migrainosus: Secondary | ICD-10-CM | POA: Diagnosis not present

## 2021-07-13 NOTE — Patient Instructions (Signed)
?  HEADACHES (with migraine features; throbbing, nausea, vomiting, sens to light, pressure) + confusion (medication side effect; stress) + transient left sided weakness (complicated migraine) ?- consider migraine treatments (continue ibuprofen, zofran, toradol as needed; consider nurtec) ? 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

## 2021-07-13 NOTE — Progress Notes (Signed)
[facility]  PATIENT: Gina Villarreal DOB: [birth year]  REFERRING CLINICIAN: Jonetta Osgood, MD HISTORY FROM: patient and daughter REASON FOR VISIT: new consult    HISTORICAL  CHIEF COMPLAINT:  Chief Complaint  Patient presents with   Cerebrovascular Accident    Rm Gilbert, dgtr- [name]  "headache came back yesterday, PCP gave her 5 Toradol; hallucinations have stopped for most part"    Headache    HISTORY OF PRESENT ILLNESS:    76 year old female here for evaluation of headache, confusion, abnormal MRI.  06/30/2021 patient went to urgent care for evaluation of severe headache and nausea.  Patient had several days of sinus pressure, sinus drainage, started on azithromycin.  Headache worsened and patient went to med center [place].  Patient was having frontal, occipital, global throbbing pressure headaches with nausea vomiting, sensitivity to light.  No prior migraine history.  She is treated with migraine cocktail symptoms slightly improved.  07/04/2021 patient with the hospital for worsening confusion, slurred speech interval oxycodone use and dehydration.  She admitted to the hospital for evaluation.  On 07/05/2021 patient had sudden onset of left facial droop, left-sided weakness, left neglect and left hemianopsia.  Code stroke was activated.  The course of evaluation, MRI of the brain on the prior day showed a punctate DWI hyperintensity left frontal region.  This is felt to be an incidental finding.  Overall symptoms gradually improved.  She was discharged on 07/08/2021.  Since then she has had only some minor hallucinations and mild headaches.  Patient has been under significant stress lately related to declining health of several family members.    REVIEW OF SYSTEMS: Full 14 system review of systems performed and negative with exception of: as per HPI.  ALLERGIES: Allergies  Allergen Reactions   Metoprolol Shortness Of Breath   Morphine And  Related Other (See Comments)    hallucinates   Atorvastatin     Other reaction(s): muscle pain   Ezetimibe-Simvastatin     Other reaction(s): muscle pain    HOME MEDICATIONS: Outpatient Medications Prior to Visit  Medication Sig Dispense Refill   aspirin 81 MG chewable tablet Chew 1 tablet (81 mg total) by mouth daily.     clopidogrel (PLAVIX) 75 MG tablet Take 1 tablet (75 mg total) by mouth daily for 21 days. 21 tablet 0   ezetimibe (ZETIA) 10 MG tablet TAKE ONE TABLET BY MOUTH DAILY 30 tablet 6   glimepiride (AMARYL) 4 MG tablet Take 4 mg by mouth 2 (two) times daily.     GLUCOSAMINE-CHONDROITIN PO Take 1 tablet by mouth daily. 1500-1200 mg     levothyroxine (SYNTHROID) 112 MCG tablet Take 112 mcg by mouth daily.     lisinopril-hydrochlorothiazide (PRINZIDE,ZESTORETIC) 10-12.5 MG tablet Take 1 tablet by mouth daily.     metFORMIN (GLUCOPHAGE) 1000 MG tablet Take 1,000 mg by mouth 2 (two) times daily with a meal.     pravastatin (PRAVACHOL) 40 MG tablet Take 40 mg by mouth 2 (two) times daily.     TRULICITY 1.5 TM/1.9QQ SOPN Inject 1.5 mg into the skin every Thursday.     Vitamin D, Ergocalciferol, (DRISDOL) 1.25 MG (50000 UNIT) CAPS capsule Take 50,000 Units by mouth once a week.     nitroGLYCERIN (NITROSTAT) 0.4 MG SL tablet Place 1 tablet (0.4 mg total) under the tongue every 5 (five) minutes as needed for chest pain. 25 tablet 5   No facility-administered medications prior to visit.    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 2003- BMS to RCA b. 01/2015- DES to mid LAD c. DES to mid-OM1   GERD (gastroesophageal reflux disease)    Hypertension    Hypothyroidism    Melanoma ([place])    "back; left lateral knee"   OSA on CPAP    Rectal cancer ([place]) 2005   Squamous cell carcinoma of right foot    Type  II diabetes mellitus ([place])     PAST SURGICAL HISTORY: Past Surgical History:  Procedure Laterality Date   APPENDECTOMY  1960's   BREAST CYST ASPIRATION Right 1980's   BREAST EXCISIONAL BIOPSY Right pt unsure   benign   CARDIAC CATHETERIZATION N/A 02/01/2015   Procedure: Left Heart Cath and Coronary Angiography;  Surgeon: Peter M Martinique, MD;  Location: [facility];  Service: Cardiovascular;  Laterality: N/A;   CARDIAC CATHETERIZATION  02/01/2015   Procedure: Coronary Stent Intervention;  Surgeon: Peter M Martinique, MD;  Location: [facility];  Service: Cardiovascular;;   COLECTOMY  2005   "rectal"   CORONARY ANGIOPLASTY WITH STENT PLACEMENT  2003; 2016   a. BMS to RCA in 2003. b. DES to mid LAD and DES to mid OM1 on 02/01/2015   LAPAROSCOPIC CHOLECYSTECTOMY  1970's   MELANOMA EXCISION     "back; left lateral knee"   RIGHT OOPHORECTOMY Right 1960's   SQUAMOUS CELL CARCINOMA EXCISION Right    "foot"   TONSILLECTOMY  1950's    FAMILY HISTORY: Family History  Problem Relation Age of Onset   Heart attack Father        Pacemaker implant and valve surgery   Cancer Brother     SOCIAL HISTORY: Social History   Socioeconomic History   Marital status: Married    Spouse name: Antony Haste   Number of children: 2   Years of education: Not on file   Highest education level: Not on file  Occupational History   Not on file  Tobacco Use   Smoking status: Former    Packs/day: 1.00    Years: 40.00    Pack years: 40.00    Types: Cigarettes    Quit date: 05/08/2001    Years since quitting: 20.1   Smokeless tobacco: Never  Vaping Use   Vaping Use: Never used  Substance and Sexual Activity   Alcohol use: Yes    Comment: rarely   Drug use: No   Sexual activity: Not Currently  Other Topics Concern   Not on file  Social History Narrative   Lives with husband   Social Determinants of Health   Financial Resource Strain: Not on file  Food Insecurity: Not on file   Transportation Needs: Not on file  Physical Activity: Not on file  Stress: Not on file  Social Connections: Not on file  Intimate Partner Violence: Not on file     PHYSICAL EXAM  GENERAL EXAM/CONSTITUTIONAL: Vitals:  Vitals:   07/13/21 1414  BP: (!) 122/58  Pulse: 84  Weight: 151 lb 6.4 oz (68.7 kg)  Height: '5\' 5"'$  (1.651 m)   Body mass index is 25.19 kg/m. Wt Readings from Last 3 Encounters:  07/13/21 151 lb 6.4 oz (68.7 kg)  07/05/21 156 lb 15.5 oz (71.2 kg)  06/30/21 153 lb (69.4 kg)  Patient is in no distress; well developed, nourished and groomed; neck is supple  CARDIOVASCULAR: Examination of carotid arteries is normal; no carotid bruits Regular rate and rhythm, no murmurs Examination of peripheral vascular system by observation and palpation is normal  EYES: Ophthalmoscopic exam of optic discs and posterior segments is normal; no papilledema or hemorrhages No results found.  MUSCULOSKELETAL: Gait, strength, tone, movements noted in Neurologic exam below  NEUROLOGIC: MENTAL STATUS:  No flowsheet data found. awake, alert, oriented to person, place and time recent and remote memory intact normal attention and concentration language fluent, comprehension intact, naming intact fund of knowledge appropriate  CRANIAL NERVE:  2nd - no papilledema on fundoscopic exam 2nd, 3rd, 4th, 6th - pupils equal and reactive to light, visual fields full to confrontation, extraocular muscles intact, no nystagmus 5th - facial sensation symmetric 7th - facial strength symmetric 8th - hearing intact 9th - palate elevates symmetrically, uvula midline 11th - shoulder shrug symmetric 12th - tongue protrusion midline  MOTOR:  normal bulk and tone, full strength in the BUE, BLE  SENSORY:  normal and symmetric to light touch, temperature, vibration  COORDINATION:  finger-nose-finger, fine finger movements normal  REFLEXES:  deep tendon reflexes present and  symmetric  GAIT/STATION:  narrow based gait    DIAGNOSTIC DATA (LABS, IMAGING, TESTING) - I reviewed patient records, labs, notes, testing and imaging myself where available.  Lab Results  Component Value Date   WBC 9.5 07/07/2021   HGB 13.0 07/07/2021   HCT 37.6 07/07/2021   MCV 83.7 07/07/2021   PLT 374 07/07/2021      Component Value Date/Time   NA 134 (L) 07/08/2021 0735   K 4.0 07/08/2021 0735   CL 97 (L) 07/08/2021 0735   CO2 27 07/08/2021 0735   GLUCOSE 146 (H) 07/08/2021 0735   BUN 12 07/08/2021 0735   CREATININE 0.88 07/08/2021 0735   CREATININE 0.81 01/29/2015 1312   CALCIUM 8.9 07/08/2021 0735   PROT 6.5 07/05/2021 0950   ALBUMIN 3.7 07/05/2021 0950   AST 18 07/05/2021 0950   ALT 17 07/05/2021 0950   ALKPHOS 57 07/05/2021 0950   BILITOT 1.1 07/05/2021 0950   GFRNONAA >60 07/08/2021 0735   GFRAA >60 02/02/2015 0352   Lab Results  Component Value Date   CHOL 121 07/06/2021   HDL 54 07/06/2021   LDLCALC 36 07/06/2021   TRIG 156 (H) 07/06/2021   CHOLHDL 2.2 07/06/2021   Lab Results  Component Value Date   HGBA1C 6.9 (H) 07/06/2021   No results found for: VITAMINB12 Lab Results  Component Value Date   TSH 8.872 (H) 07/05/2021    07/04/21 MRI brain - No acute intracranial pathology.  07/05/21 CTA head / neck 1. No intracranial large vessel occlusion or significant stenosis 2. Mild atherosclerotic disease in the carotid bifurcation bilaterally without stenosis. Both vertebral arteries are patent without stenosis.  07/05/21 MRI brain [I reviewed images myself and agree with interpretation. Punctate left frontal cortical DWI hyperintensity noted. -VRP]  - Motion degraded and truncated examination. No acute ischemia.   ASSESSMENT AND PLAN  76 y.o. year old female here with onset of migraine headaches, in the setting of increased stress and infection.   Dx:  1. Migraine with aura and with status migrainosus, not intractable   2. 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 Migraine phenomenon?) - continue aspirin, clopidogrel, zetia, statin, metformin, trulicity, BP control  Return for pending if symptoms worsen or fail to improve.    Penni Bombard, MD 11/14/3966, 8:64 PM Certified in Neurology, Neurophysiology and Neuroimaging  [facility] [address] [place], [place] [address] [phone]

## 2021-07-15 ENCOUNTER — Telehealth (HOSPITAL_COMMUNITY): Payer: Self-pay

## 2021-07-15 ENCOUNTER — Other Ambulatory Visit (HOSPITAL_COMMUNITY): Payer: Self-pay

## 2021-07-15 DIAGNOSIS — I10 Essential (primary) hypertension: Secondary | ICD-10-CM | POA: Diagnosis not present

## 2021-07-15 DIAGNOSIS — I2510 Atherosclerotic heart disease of native coronary artery without angina pectoris: Secondary | ICD-10-CM | POA: Diagnosis not present

## 2021-07-15 DIAGNOSIS — E1165 Type 2 diabetes mellitus with hyperglycemia: Secondary | ICD-10-CM | POA: Diagnosis not present

## 2021-07-15 DIAGNOSIS — E039 Hypothyroidism, unspecified: Secondary | ICD-10-CM | POA: Diagnosis not present

## 2021-07-15 DIAGNOSIS — E782 Mixed hyperlipidemia: Secondary | ICD-10-CM | POA: Diagnosis not present

## 2021-07-15 DIAGNOSIS — Z85038 Personal history of other malignant neoplasm of large intestine: Secondary | ICD-10-CM | POA: Diagnosis not present

## 2021-07-15 NOTE — Telephone Encounter (Signed)
Pharmacy Transitions of Care Follow-up Telephone Call ? ?Date of discharge: 07/08/21    ?Discharge Diagnosis: stroke, encephalopathy ? ?How have you been since you were released from the hospital? Good, less frequent headache  ? ?Medication changes made at discharge: ? - START: Plavix ? - STOPPED: narcotics, omega ? - CHANGED:  ? ?Medication changes verified by the patient? Yes  ? ?Medication Accessibility: ? ?Home Pharmacy: Yes  ? ?Was the patient provided with refills on discharged medications? No  ? ?Have all prescriptions been transferred from [facility] to home pharmacy? NA  ? ?Is the patient able to afford medications? Yes ?Notable copays: Trulicity ?Eligible patient assistance: Pt has Medicare Part D ?  ? ?Medication Review: ?CLOPIDOGREL (PLAVIX) ?Clopidogrel 75 mg once daily.  ?- Educated patient on expected duration of therapy of 3 weeks with clopidogrel.Advised patient that aspirin will be continued indefinitely.  ?- Reviewed potential DDIs with patient  ?- Advised patient of medications to avoid (NSAIDs, ASA)  ?- Educated that Tylenol (acetaminophen) will be the preferred analgesic to prevent risk of bleeding  ?- Emphasized importance of monitoring for signs and symptoms of bleeding (abnormal bruising, prolonged bleeding, nose bleeds, bleeding from gums, discolored urine, black tarry stools)  ?- Advised patient to alert all providers of anticoagulation therapy prior to starting a new medication or having a procedure  ? ? ?Follow-up Appointments: ? ?PCP Hospital f/u appt confirmed? Yes Scheduled to see Primary care today.  ? ?[facility] f/u appt confirmed? Pt has seen neurology. ? ?If their condition worsens, is the pt aware to call PCP or go to the Emergency Dept.? Yes ? ?Final Patient Assessment: ?Gina Villarreal is alert and improving. She states less frequent headache since discharge. She has been taking Tylenol and Advil for pain and I advised her to stop Advil and ask her primary care MD today at her  appt for an alternative due to her Plavix and ASA therapy. I offered [facility] free mailing service due to her limited ability to drive currently.  ? ?

## 2021-07-19 DIAGNOSIS — Z933 Colostomy status: Secondary | ICD-10-CM | POA: Diagnosis not present

## 2021-08-02 DIAGNOSIS — H2513 Age-related nuclear cataract, bilateral: Secondary | ICD-10-CM | POA: Diagnosis not present

## 2021-08-02 DIAGNOSIS — E113293 Type 2 diabetes mellitus with mild nonproliferative diabetic retinopathy without macular edema, bilateral: Secondary | ICD-10-CM | POA: Diagnosis not present

## 2021-08-02 DIAGNOSIS — H04123 Dry eye syndrome of bilateral lacrimal glands: Secondary | ICD-10-CM | POA: Diagnosis not present

## 2021-11-23 DIAGNOSIS — Z933 Colostomy status: Secondary | ICD-10-CM | POA: Diagnosis not present

## 2022-01-13 DIAGNOSIS — Z0000 Encounter for general adult medical examination without abnormal findings: Secondary | ICD-10-CM | POA: Diagnosis not present

## 2022-01-13 DIAGNOSIS — E782 Mixed hyperlipidemia: Secondary | ICD-10-CM | POA: Diagnosis not present

## 2022-01-13 DIAGNOSIS — G43109 Migraine with aura, not intractable, without status migrainosus: Secondary | ICD-10-CM | POA: Diagnosis not present

## 2022-01-13 DIAGNOSIS — E039 Hypothyroidism, unspecified: Secondary | ICD-10-CM | POA: Diagnosis not present

## 2022-01-13 DIAGNOSIS — I2510 Atherosclerotic heart disease of native coronary artery without angina pectoris: Secondary | ICD-10-CM | POA: Diagnosis not present

## 2022-01-13 DIAGNOSIS — I10 Essential (primary) hypertension: Secondary | ICD-10-CM | POA: Diagnosis not present

## 2022-01-13 DIAGNOSIS — Z85038 Personal history of other malignant neoplasm of large intestine: Secondary | ICD-10-CM | POA: Diagnosis not present

## 2022-01-13 DIAGNOSIS — E1165 Type 2 diabetes mellitus with hyperglycemia: Secondary | ICD-10-CM | POA: Diagnosis not present

## 2022-01-17 ENCOUNTER — Other Ambulatory Visit: Payer: Self-pay | Admitting: Family Medicine

## 2022-01-17 DIAGNOSIS — E2839 Other primary ovarian failure: Secondary | ICD-10-CM

## 2022-01-25 ENCOUNTER — Other Ambulatory Visit: Payer: Self-pay | Admitting: Family Medicine

## 2022-01-25 DIAGNOSIS — Z1231 Encounter for screening mammogram for malignant neoplasm of breast: Secondary | ICD-10-CM

## 2022-02-14 DIAGNOSIS — Z933 Colostomy status: Secondary | ICD-10-CM | POA: Diagnosis not present

## 2022-02-21 ENCOUNTER — Ambulatory Visit: Payer: Medicare Other

## 2022-06-01 DIAGNOSIS — Z933 Colostomy status: Secondary | ICD-10-CM | POA: Diagnosis not present

## 2022-06-01 DIAGNOSIS — C20 Malignant neoplasm of rectum: Secondary | ICD-10-CM | POA: Diagnosis not present

## 2022-06-01 DIAGNOSIS — E119 Type 2 diabetes mellitus without complications: Secondary | ICD-10-CM | POA: Diagnosis not present

## 2022-06-01 DIAGNOSIS — C189 Malignant neoplasm of colon, unspecified: Secondary | ICD-10-CM | POA: Diagnosis not present

## 2022-06-20 ENCOUNTER — Other Ambulatory Visit (HOSPITAL_COMMUNITY): Payer: Self-pay

## 2022-06-20 MED ORDER — TRULICITY 1.5 MG/0.5ML ~~LOC~~ SOAJ
1.5000 mg | SUBCUTANEOUS | 3 refills | Status: DC
  Filled 2022-06-20: qty 2, 28d supply, fill #0
  Filled 2022-07-19: qty 2, 28d supply, fill #1
  Filled 2022-08-11: qty 2, 28d supply, fill #2
  Filled 2022-09-15: qty 2, 28d supply, fill #3

## 2022-06-26 ENCOUNTER — Ambulatory Visit
Admission: RE | Admit: 2022-06-26 | Discharge: 2022-06-26 | Disposition: A | Discharge status: Home / Self Care | Payer: Medicare Other | Source: Ambulatory Visit | Attending: Family Medicine | Admitting: Family Medicine

## 2022-06-26 DIAGNOSIS — M85852 Other specified disorders of bone density and structure, left thigh: Secondary | ICD-10-CM | POA: Diagnosis not present

## 2022-06-26 DIAGNOSIS — Z780 Asymptomatic menopausal state: Secondary | ICD-10-CM | POA: Diagnosis not present

## 2022-06-26 DIAGNOSIS — E2839 Other primary ovarian failure: Secondary | ICD-10-CM

## 2022-07-06 ENCOUNTER — Ambulatory Visit
Admission: RE | Admit: 2022-07-06 | Discharge: 2022-07-06 | Disposition: A | Discharge status: Home / Self Care | Payer: Medicare Other | Source: Ambulatory Visit | Attending: Family Medicine | Admitting: Family Medicine

## 2022-07-06 DIAGNOSIS — M778 Other enthesopathies, not elsewhere classified: Secondary | ICD-10-CM | POA: Diagnosis not present

## 2022-07-06 DIAGNOSIS — Z1231 Encounter for screening mammogram for malignant neoplasm of breast: Secondary | ICD-10-CM | POA: Diagnosis not present

## 2022-07-06 DIAGNOSIS — L03818 Cellulitis of other sites: Secondary | ICD-10-CM | POA: Diagnosis not present

## 2022-07-11 DIAGNOSIS — Z23 Encounter for immunization: Secondary | ICD-10-CM | POA: Diagnosis not present

## 2022-07-11 DIAGNOSIS — I10 Essential (primary) hypertension: Secondary | ICD-10-CM | POA: Diagnosis not present

## 2022-07-11 DIAGNOSIS — E1165 Type 2 diabetes mellitus with hyperglycemia: Secondary | ICD-10-CM | POA: Diagnosis not present

## 2022-07-11 DIAGNOSIS — E113293 Type 2 diabetes mellitus with mild nonproliferative diabetic retinopathy without macular edema, bilateral: Secondary | ICD-10-CM | POA: Diagnosis not present

## 2022-07-11 DIAGNOSIS — E782 Mixed hyperlipidemia: Secondary | ICD-10-CM | POA: Diagnosis not present

## 2022-07-19 ENCOUNTER — Other Ambulatory Visit (HOSPITAL_COMMUNITY): Payer: Self-pay

## 2022-07-19 DIAGNOSIS — M25521 Pain in right elbow: Secondary | ICD-10-CM | POA: Diagnosis not present

## 2022-07-19 MED ORDER — DICLOFENAC SODIUM 1 % EX GEL
CUTANEOUS | 1 refills | Status: AC
  Filled 2022-07-19: qty 100, 6d supply, fill #0

## 2022-08-10 DIAGNOSIS — M25521 Pain in right elbow: Secondary | ICD-10-CM | POA: Diagnosis not present

## 2022-08-14 ENCOUNTER — Other Ambulatory Visit (HOSPITAL_COMMUNITY): Payer: Self-pay

## 2022-08-14 MED ORDER — TRAZODONE HCL 50 MG PO TABS
ORAL_TABLET | ORAL | 3 refills | Status: DC
  Filled 2022-08-14: qty 30, 30d supply, fill #0
  Filled 2022-09-24: qty 30, 30d supply, fill #1
  Filled 2022-11-19: qty 30, 30d supply, fill #2
  Filled 2023-01-28: qty 30, 30d supply, fill #3

## 2022-09-16 ENCOUNTER — Other Ambulatory Visit (HOSPITAL_COMMUNITY): Payer: Self-pay

## 2022-09-18 ENCOUNTER — Other Ambulatory Visit (HOSPITAL_COMMUNITY): Payer: Self-pay

## 2022-09-19 ENCOUNTER — Other Ambulatory Visit (HOSPITAL_COMMUNITY): Payer: Self-pay

## 2022-09-19 MED ORDER — TRULICITY 1.5 MG/0.5ML ~~LOC~~ SOAJ
1.5000 mg | SUBCUTANEOUS | 3 refills | Status: DC
  Filled 2022-10-14: qty 2, 28d supply, fill #0
  Filled 2022-11-12: qty 2, 28d supply, fill #1
  Filled 2022-12-12: qty 2, 28d supply, fill #2
  Filled 2023-01-13: qty 2, 28d supply, fill #3

## 2022-10-16 ENCOUNTER — Other Ambulatory Visit (HOSPITAL_COMMUNITY): Payer: Self-pay

## 2022-10-16 DIAGNOSIS — M25521 Pain in right elbow: Secondary | ICD-10-CM | POA: Diagnosis not present

## 2022-10-25 DIAGNOSIS — M25521 Pain in right elbow: Secondary | ICD-10-CM | POA: Diagnosis not present

## 2022-11-02 DIAGNOSIS — Z933 Colostomy status: Secondary | ICD-10-CM | POA: Diagnosis not present

## 2022-11-02 DIAGNOSIS — M25521 Pain in right elbow: Secondary | ICD-10-CM | POA: Diagnosis not present

## 2022-11-03 DIAGNOSIS — Z933 Colostomy status: Secondary | ICD-10-CM | POA: Diagnosis not present

## 2022-11-08 DIAGNOSIS — M25521 Pain in right elbow: Secondary | ICD-10-CM | POA: Diagnosis not present

## 2022-11-13 ENCOUNTER — Other Ambulatory Visit (HOSPITAL_COMMUNITY): Payer: Self-pay

## 2022-11-15 ENCOUNTER — Other Ambulatory Visit (HOSPITAL_COMMUNITY): Payer: Self-pay

## 2022-11-16 DIAGNOSIS — M25521 Pain in right elbow: Secondary | ICD-10-CM | POA: Diagnosis not present

## 2022-11-22 DIAGNOSIS — M25521 Pain in right elbow: Secondary | ICD-10-CM | POA: Diagnosis not present

## 2022-11-29 DIAGNOSIS — M25521 Pain in right elbow: Secondary | ICD-10-CM | POA: Diagnosis not present

## 2022-12-06 DIAGNOSIS — M25521 Pain in right elbow: Secondary | ICD-10-CM | POA: Diagnosis not present

## 2022-12-12 ENCOUNTER — Other Ambulatory Visit (HOSPITAL_COMMUNITY): Payer: Self-pay

## 2022-12-20 DIAGNOSIS — M25521 Pain in right elbow: Secondary | ICD-10-CM | POA: Diagnosis not present

## 2023-01-13 ENCOUNTER — Other Ambulatory Visit (HOSPITAL_COMMUNITY): Payer: Self-pay

## 2023-01-16 ENCOUNTER — Other Ambulatory Visit (HOSPITAL_COMMUNITY): Payer: Self-pay

## 2023-01-31 ENCOUNTER — Other Ambulatory Visit (HOSPITAL_COMMUNITY): Payer: Self-pay

## 2023-02-02 DIAGNOSIS — E113293 Type 2 diabetes mellitus with mild nonproliferative diabetic retinopathy without macular edema, bilateral: Secondary | ICD-10-CM | POA: Diagnosis not present

## 2023-02-02 DIAGNOSIS — I10 Essential (primary) hypertension: Secondary | ICD-10-CM | POA: Diagnosis not present

## 2023-02-02 DIAGNOSIS — E1121 Type 2 diabetes mellitus with diabetic nephropathy: Secondary | ICD-10-CM | POA: Diagnosis not present

## 2023-02-02 DIAGNOSIS — Z23 Encounter for immunization: Secondary | ICD-10-CM | POA: Diagnosis not present

## 2023-02-02 DIAGNOSIS — E039 Hypothyroidism, unspecified: Secondary | ICD-10-CM | POA: Diagnosis not present

## 2023-02-02 DIAGNOSIS — E782 Mixed hyperlipidemia: Secondary | ICD-10-CM | POA: Diagnosis not present

## 2023-02-02 DIAGNOSIS — Z0000 Encounter for general adult medical examination without abnormal findings: Secondary | ICD-10-CM | POA: Diagnosis not present

## 2023-02-02 DIAGNOSIS — G43109 Migraine with aura, not intractable, without status migrainosus: Secondary | ICD-10-CM | POA: Diagnosis not present

## 2023-02-26 ENCOUNTER — Other Ambulatory Visit (HOSPITAL_COMMUNITY): Payer: Self-pay

## 2023-03-02 ENCOUNTER — Other Ambulatory Visit (HOSPITAL_COMMUNITY): Payer: Self-pay

## 2023-03-02 MED ORDER — TRULICITY 1.5 MG/0.5ML ~~LOC~~ SOAJ
1.5000 mg | SUBCUTANEOUS | 3 refills | Status: DC
  Filled 2023-03-02: qty 2, 28d supply, fill #0
  Filled 2023-03-23: qty 2, 28d supply, fill #1
  Filled 2023-04-21: qty 2, 28d supply, fill #2
  Filled 2023-05-20: qty 2, 28d supply, fill #3

## 2023-03-23 ENCOUNTER — Other Ambulatory Visit (HOSPITAL_COMMUNITY): Payer: Self-pay

## 2023-03-24 ENCOUNTER — Other Ambulatory Visit (HOSPITAL_COMMUNITY): Payer: Self-pay

## 2023-03-25 ENCOUNTER — Other Ambulatory Visit (HOSPITAL_COMMUNITY): Payer: Self-pay

## 2023-03-26 ENCOUNTER — Other Ambulatory Visit (HOSPITAL_COMMUNITY): Payer: Self-pay

## 2023-03-26 MED ORDER — TRAZODONE HCL 50 MG PO TABS
50.0000 mg | ORAL_TABLET | Freq: Every evening | ORAL | 3 refills | Status: DC | PRN
  Filled 2023-03-26: qty 30, 30d supply, fill #0
  Filled 2023-04-21: qty 30, 30d supply, fill #1
  Filled 2023-05-20: qty 30, 30d supply, fill #2
  Filled 2023-07-01: qty 30, 30d supply, fill #3

## 2023-03-28 ENCOUNTER — Other Ambulatory Visit (HOSPITAL_COMMUNITY): Payer: Self-pay

## 2023-04-23 ENCOUNTER — Other Ambulatory Visit: Payer: Self-pay

## 2023-05-10 DIAGNOSIS — R6889 Other general symptoms and signs: Secondary | ICD-10-CM | POA: Diagnosis not present

## 2023-05-21 ENCOUNTER — Other Ambulatory Visit (HOSPITAL_BASED_OUTPATIENT_CLINIC_OR_DEPARTMENT_OTHER): Payer: Self-pay

## 2023-05-21 ENCOUNTER — Other Ambulatory Visit (HOSPITAL_COMMUNITY): Payer: Self-pay

## 2023-05-21 DIAGNOSIS — R6889 Other general symptoms and signs: Secondary | ICD-10-CM | POA: Diagnosis not present

## 2023-06-17 ENCOUNTER — Other Ambulatory Visit (HOSPITAL_COMMUNITY): Payer: Self-pay

## 2023-06-19 ENCOUNTER — Other Ambulatory Visit (HOSPITAL_COMMUNITY): Payer: Self-pay

## 2023-06-19 MED ORDER — TRULICITY 1.5 MG/0.5ML ~~LOC~~ SOAJ
1.5000 mg | SUBCUTANEOUS | 3 refills | Status: DC
  Filled 2023-06-19: qty 2, 28d supply, fill #0
  Filled 2023-07-15: qty 2, 28d supply, fill #1
  Filled 2023-08-12: qty 2, 28d supply, fill #2
  Filled 2023-09-09: qty 2, 28d supply, fill #3

## 2023-07-30 ENCOUNTER — Other Ambulatory Visit (HOSPITAL_COMMUNITY): Payer: Self-pay

## 2023-07-31 ENCOUNTER — Other Ambulatory Visit (HOSPITAL_COMMUNITY): Payer: Self-pay

## 2023-07-31 MED ORDER — TRAZODONE HCL 50 MG PO TABS
50.0000 mg | ORAL_TABLET | Freq: Every evening | ORAL | 0 refills | Status: DC | PRN
  Filled 2023-07-31: qty 30, 30d supply, fill #0

## 2023-08-02 DIAGNOSIS — E1121 Type 2 diabetes mellitus with diabetic nephropathy: Secondary | ICD-10-CM | POA: Diagnosis not present

## 2023-08-02 DIAGNOSIS — I10 Essential (primary) hypertension: Secondary | ICD-10-CM | POA: Diagnosis not present

## 2023-08-02 DIAGNOSIS — F5101 Primary insomnia: Secondary | ICD-10-CM | POA: Diagnosis not present

## 2023-08-02 DIAGNOSIS — G43109 Migraine with aura, not intractable, without status migrainosus: Secondary | ICD-10-CM | POA: Diagnosis not present

## 2023-08-02 DIAGNOSIS — M79601 Pain in right arm: Secondary | ICD-10-CM | POA: Diagnosis not present

## 2023-08-02 DIAGNOSIS — E113293 Type 2 diabetes mellitus with mild nonproliferative diabetic retinopathy without macular edema, bilateral: Secondary | ICD-10-CM | POA: Diagnosis not present

## 2023-08-02 DIAGNOSIS — E782 Mixed hyperlipidemia: Secondary | ICD-10-CM | POA: Diagnosis not present

## 2023-08-02 DIAGNOSIS — E039 Hypothyroidism, unspecified: Secondary | ICD-10-CM | POA: Diagnosis not present

## 2023-08-25 ENCOUNTER — Other Ambulatory Visit (HOSPITAL_COMMUNITY): Payer: Self-pay

## 2023-08-27 ENCOUNTER — Other Ambulatory Visit (HOSPITAL_COMMUNITY): Payer: Self-pay

## 2023-08-27 MED ORDER — TRAZODONE HCL 50 MG PO TABS
50.0000 mg | ORAL_TABLET | Freq: Every evening | ORAL | 3 refills | Status: DC | PRN
  Filled 2023-08-27: qty 30, 30d supply, fill #0
  Filled 2023-10-06: qty 30, 30d supply, fill #1
  Filled 2023-11-03: qty 30, 30d supply, fill #2
  Filled 2023-12-02: qty 30, 30d supply, fill #3

## 2023-08-28 DIAGNOSIS — M25521 Pain in right elbow: Secondary | ICD-10-CM | POA: Diagnosis not present

## 2023-08-28 DIAGNOSIS — G8929 Other chronic pain: Secondary | ICD-10-CM | POA: Diagnosis not present

## 2023-08-28 DIAGNOSIS — M79601 Pain in right arm: Secondary | ICD-10-CM | POA: Diagnosis not present

## 2023-08-28 DIAGNOSIS — M24521 Contracture, right elbow: Secondary | ICD-10-CM | POA: Diagnosis not present

## 2023-09-07 ENCOUNTER — Other Ambulatory Visit: Payer: Self-pay | Admitting: Family Medicine

## 2023-09-07 DIAGNOSIS — M25521 Pain in right elbow: Secondary | ICD-10-CM

## 2023-10-02 ENCOUNTER — Ambulatory Visit
Admission: RE | Admit: 2023-10-02 | Discharge: 2023-10-02 | Disposition: A | Discharge status: Home / Self Care | Source: Ambulatory Visit | Attending: Family Medicine | Admitting: Family Medicine

## 2023-10-02 DIAGNOSIS — M25521 Pain in right elbow: Secondary | ICD-10-CM

## 2023-10-02 DIAGNOSIS — M19021 Primary osteoarthritis, right elbow: Secondary | ICD-10-CM | POA: Diagnosis not present

## 2023-10-06 ENCOUNTER — Other Ambulatory Visit (HOSPITAL_COMMUNITY): Payer: Self-pay

## 2023-10-08 ENCOUNTER — Other Ambulatory Visit: Payer: Self-pay

## 2023-10-08 ENCOUNTER — Other Ambulatory Visit (HOSPITAL_COMMUNITY): Payer: Self-pay

## 2023-10-08 ENCOUNTER — Other Ambulatory Visit (HOSPITAL_BASED_OUTPATIENT_CLINIC_OR_DEPARTMENT_OTHER): Payer: Self-pay

## 2023-10-08 MED ORDER — TRULICITY 1.5 MG/0.5ML ~~LOC~~ SOAJ
1.5000 mg | SUBCUTANEOUS | 5 refills | Status: AC
  Filled 2023-10-08: qty 2, 28d supply, fill #0
  Filled 2023-11-03: qty 2, 28d supply, fill #1
  Filled 2023-12-02: qty 2, 28d supply, fill #2
  Filled 2023-12-28: qty 2, 28d supply, fill #3
  Filled 2024-01-28: qty 2, 28d supply, fill #4
  Filled 2024-02-22: qty 2, 28d supply, fill #5

## 2023-11-20 DIAGNOSIS — M24521 Contracture, right elbow: Secondary | ICD-10-CM | POA: Diagnosis not present

## 2023-11-26 DIAGNOSIS — R6889 Other general symptoms and signs: Secondary | ICD-10-CM | POA: Diagnosis not present

## 2023-12-25 DIAGNOSIS — R252 Cramp and spasm: Secondary | ICD-10-CM | POA: Diagnosis not present

## 2023-12-28 ENCOUNTER — Other Ambulatory Visit: Payer: Self-pay

## 2023-12-28 ENCOUNTER — Other Ambulatory Visit (HOSPITAL_COMMUNITY): Payer: Self-pay

## 2023-12-28 MED ORDER — TRAZODONE HCL 50 MG PO TABS
50.0000 mg | ORAL_TABLET | Freq: Every evening | ORAL | 2 refills | Status: DC | PRN
  Filled 2023-12-28: qty 30, 30d supply, fill #0
  Filled 2024-01-28: qty 30, 30d supply, fill #1
  Filled 2024-02-22: qty 30, 30d supply, fill #2

## 2024-01-01 DIAGNOSIS — M7918 Myalgia, other site: Secondary | ICD-10-CM | POA: Diagnosis not present

## 2024-01-01 DIAGNOSIS — M24521 Contracture, right elbow: Secondary | ICD-10-CM | POA: Diagnosis not present

## 2024-01-01 DIAGNOSIS — G249 Dystonia, unspecified: Secondary | ICD-10-CM | POA: Diagnosis not present

## 2024-01-29 DIAGNOSIS — M25621 Stiffness of right elbow, not elsewhere classified: Secondary | ICD-10-CM | POA: Diagnosis not present

## 2024-01-29 DIAGNOSIS — M25521 Pain in right elbow: Secondary | ICD-10-CM | POA: Diagnosis not present

## 2024-02-26 DIAGNOSIS — M25521 Pain in right elbow: Secondary | ICD-10-CM | POA: Diagnosis not present

## 2024-02-26 DIAGNOSIS — M25621 Stiffness of right elbow, not elsewhere classified: Secondary | ICD-10-CM | POA: Diagnosis not present

## 2024-03-06 DIAGNOSIS — I10 Essential (primary) hypertension: Secondary | ICD-10-CM | POA: Diagnosis not present

## 2024-03-06 DIAGNOSIS — Z0000 Encounter for general adult medical examination without abnormal findings: Secondary | ICD-10-CM | POA: Diagnosis not present

## 2024-03-06 DIAGNOSIS — E1121 Type 2 diabetes mellitus with diabetic nephropathy: Secondary | ICD-10-CM | POA: Diagnosis not present

## 2024-03-12 ENCOUNTER — Other Ambulatory Visit (HOSPITAL_COMMUNITY): Payer: Self-pay

## 2024-03-14 ENCOUNTER — Other Ambulatory Visit (HOSPITAL_COMMUNITY): Payer: Self-pay

## 2024-03-14 MED ORDER — TRULICITY 3 MG/0.5ML ~~LOC~~ SOAJ
3.0000 mg | SUBCUTANEOUS | 5 refills | Status: AC
  Filled 2024-03-14 – 2024-03-27 (×2): qty 2, 28d supply, fill #0

## 2024-03-23 ENCOUNTER — Other Ambulatory Visit (HOSPITAL_COMMUNITY): Payer: Self-pay

## 2024-03-24 ENCOUNTER — Other Ambulatory Visit (HOSPITAL_COMMUNITY): Payer: Self-pay

## 2024-03-26 ENCOUNTER — Other Ambulatory Visit: Payer: Self-pay

## 2024-03-26 ENCOUNTER — Other Ambulatory Visit (HOSPITAL_COMMUNITY): Payer: Self-pay

## 2024-03-26 MED ORDER — TRAZODONE HCL 50 MG PO TABS
50.0000 mg | ORAL_TABLET | Freq: Every evening | ORAL | 6 refills | Status: AC | PRN
  Filled 2024-03-26: qty 30, 30d supply, fill #0
  Filled 2024-05-03: qty 30, 30d supply, fill #1
  Filled 2024-06-01: qty 30, 30d supply, fill #2

## 2024-03-27 ENCOUNTER — Other Ambulatory Visit (HOSPITAL_COMMUNITY): Payer: Self-pay

## 2024-03-29 ENCOUNTER — Other Ambulatory Visit (HOSPITAL_COMMUNITY): Payer: Self-pay

## 2024-04-22 ENCOUNTER — Other Ambulatory Visit (HOSPITAL_COMMUNITY): Payer: Self-pay

## 2024-04-22 MED ORDER — TRULICITY 3 MG/0.5ML ~~LOC~~ SOAJ
3.0000 mg | SUBCUTANEOUS | 1 refills | Status: AC
  Filled 2024-04-22: qty 6, 84d supply, fill #0

## 2024-04-23 ENCOUNTER — Other Ambulatory Visit (HOSPITAL_COMMUNITY): Payer: Self-pay
